# Patient Record
Sex: Male | Born: 1940
Health system: Southern US, Community
[De-identification: ages and names within clinical notes are randomized; demographics above are authoritative.]

## PROBLEM LIST (undated history)

## (undated) DIAGNOSIS — R030 Elevated blood-pressure reading, without diagnosis of hypertension: Secondary | ICD-10-CM

## (undated) DIAGNOSIS — Z87898 Personal history of other specified conditions: Secondary | ICD-10-CM

## (undated) DIAGNOSIS — M961 Postlaminectomy syndrome, not elsewhere classified: Secondary | ICD-10-CM

## (undated) DIAGNOSIS — M5431 Sciatica, right side: Secondary | ICD-10-CM

## (undated) DIAGNOSIS — Z87442 Personal history of urinary calculi: Secondary | ICD-10-CM

## (undated) DIAGNOSIS — G8929 Other chronic pain: Secondary | ICD-10-CM

## (undated) DIAGNOSIS — M549 Dorsalgia, unspecified: Secondary | ICD-10-CM

## (undated) HISTORY — DX: Sciatica, right side: M54.31

## (undated) HISTORY — DX: Personal history of other specified conditions: Z87.898

## (undated) HISTORY — DX: Postlaminectomy syndrome, not elsewhere classified: M96.1

## (undated) HISTORY — DX: Personal history of urinary calculi: Z87.442

## (undated) HISTORY — PX: OTHER SURGICAL HISTORY: SHX169

## (undated) HISTORY — DX: Elevated blood-pressure reading, without diagnosis of hypertension: R03.0

## (undated) HISTORY — PX: APPENDECTOMY: SHX54

---

## 2003-09-28 ENCOUNTER — Emergency Department (HOSPITAL_COMMUNITY): Admission: EM | Admit: 2003-09-28 | Discharge: 2003-09-28 | Payer: Self-pay | Admitting: Emergency Medicine

## 2004-01-23 ENCOUNTER — Emergency Department (HOSPITAL_COMMUNITY): Admission: EM | Admit: 2004-01-23 | Discharge: 2004-01-23 | Payer: Self-pay | Admitting: Emergency Medicine

## 2005-09-02 ENCOUNTER — Encounter: Admission: RE | Admit: 2005-09-02 | Discharge: 2005-09-02 | Payer: Self-pay | Admitting: Family Medicine

## 2005-09-22 ENCOUNTER — Encounter: Admission: RE | Admit: 2005-09-22 | Discharge: 2005-09-22 | Payer: Self-pay | Admitting: Family Medicine

## 2005-10-13 ENCOUNTER — Encounter: Payer: Self-pay | Admitting: Internal Medicine

## 2006-07-03 ENCOUNTER — Encounter: Payer: Self-pay | Admitting: Internal Medicine

## 2006-08-16 ENCOUNTER — Ambulatory Visit (HOSPITAL_BASED_OUTPATIENT_CLINIC_OR_DEPARTMENT_OTHER): Admission: RE | Admit: 2006-08-16 | Discharge: 2006-08-16 | Payer: Self-pay | Admitting: Orthopedic Surgery

## 2007-03-06 ENCOUNTER — Encounter: Admission: RE | Admit: 2007-03-06 | Discharge: 2007-03-16 | Payer: Self-pay | Admitting: Family Medicine

## 2007-09-21 ENCOUNTER — Encounter: Payer: Self-pay | Admitting: Internal Medicine

## 2008-03-27 ENCOUNTER — Encounter: Payer: Self-pay | Admitting: Internal Medicine

## 2008-05-17 ENCOUNTER — Emergency Department (HOSPITAL_COMMUNITY): Admission: EM | Admit: 2008-05-17 | Discharge: 2008-05-17 | Payer: Self-pay | Admitting: Emergency Medicine

## 2009-11-12 ENCOUNTER — Encounter: Payer: Self-pay | Admitting: Internal Medicine

## 2009-11-13 ENCOUNTER — Encounter: Payer: Self-pay | Admitting: Internal Medicine

## 2009-11-23 ENCOUNTER — Encounter: Payer: Self-pay | Admitting: Internal Medicine

## 2010-02-09 ENCOUNTER — Ambulatory Visit: Payer: Self-pay | Admitting: Internal Medicine

## 2010-02-09 DIAGNOSIS — R51 Headache: Secondary | ICD-10-CM | POA: Insufficient documentation

## 2010-02-09 DIAGNOSIS — R519 Headache, unspecified: Secondary | ICD-10-CM | POA: Insufficient documentation

## 2010-02-09 DIAGNOSIS — J189 Pneumonia, unspecified organism: Secondary | ICD-10-CM | POA: Insufficient documentation

## 2010-02-09 LAB — CONVERTED CEMR LAB
Cholesterol, target level: 200 mg/dL
HDL goal, serum: 40 mg/dL
LDL Goal: 160 mg/dL

## 2010-02-11 ENCOUNTER — Ambulatory Visit: Payer: Self-pay | Admitting: Internal Medicine

## 2010-02-17 ENCOUNTER — Ambulatory Visit: Payer: Self-pay | Admitting: Internal Medicine

## 2010-02-17 LAB — CONVERTED CEMR LAB
BUN: 17 mg/dL (ref 6–23)
Creatinine, Ser: 0.9 mg/dL (ref 0.4–1.5)

## 2010-02-20 ENCOUNTER — Ambulatory Visit: Payer: Self-pay | Admitting: Diagnostic Radiology

## 2010-02-20 ENCOUNTER — Ambulatory Visit (HOSPITAL_BASED_OUTPATIENT_CLINIC_OR_DEPARTMENT_OTHER): Admission: RE | Admit: 2010-02-20 | Discharge: 2010-02-20 | Payer: Self-pay | Admitting: Internal Medicine

## 2010-02-23 ENCOUNTER — Telehealth: Payer: Self-pay | Admitting: Internal Medicine

## 2010-02-25 ENCOUNTER — Telehealth: Payer: Self-pay | Admitting: Internal Medicine

## 2010-03-02 ENCOUNTER — Ambulatory Visit: Payer: Self-pay | Admitting: Internal Medicine

## 2010-03-05 ENCOUNTER — Telehealth: Payer: Self-pay | Admitting: Internal Medicine

## 2010-03-09 ENCOUNTER — Encounter: Payer: Self-pay | Admitting: Internal Medicine

## 2010-03-25 ENCOUNTER — Telehealth: Payer: Self-pay | Admitting: Internal Medicine

## 2010-04-14 ENCOUNTER — Telehealth: Payer: Self-pay | Admitting: Internal Medicine

## 2010-05-07 ENCOUNTER — Telehealth: Payer: Self-pay | Admitting: Internal Medicine

## 2010-05-18 ENCOUNTER — Encounter: Payer: Self-pay | Admitting: Internal Medicine

## 2010-05-24 ENCOUNTER — Ambulatory Visit: Payer: Self-pay | Admitting: Internal Medicine

## 2010-05-27 LAB — CONVERTED CEMR LAB: PSA: 2.78 ng/mL (ref 0.10–4.00)

## 2010-06-15 ENCOUNTER — Telehealth: Payer: Self-pay | Admitting: Internal Medicine

## 2010-06-20 HISTORY — PX: LUMBAR DISC SURGERY: SHX700

## 2010-07-20 NOTE — Assessment & Plan Note (Signed)
Summary: 3 MONTH OV//PH   Vital Signs:  Patient profile:   70 year old male Weight:      221.25 pounds Pulse rate:   97 / minute Pulse rhythm:   regular BP sitting:   132 / 74  (left arm) Cuff size:   regular  Vitals Entered By: Army Fossa CMA (May 24, 2010 8:40 AM) CC: 3 month f/u- not fasting Comments [facility] [address]   History of Present Illness: ROV As far as the headaches, his MRI was negative, he saw neurology, they discontinue all the pain medicine and put him on Topamax. He is doing great. As far as the pneumonia, his chest x-ray was negative, he is now asymptomatic   Current Medications (verified): 1)  Topamax 50 Mg Tabs (Topiramate) .Marland Kitchen.. 1 By Mouth Two Times A Day.  Allergies (verified): 1)  ! Pcn  Past History:  Past Medical History: long h/o HA-- s/p extensive w/u in the past. Has seen Dr Richardean Chimera and neuro @ [facility]  h/o kidney stones   Past Surgical History: Reviewed history from 02/09/2010 and no changes required. Appendectomy R arm surgery   Social History: Reviewed history from 02/09/2010 and no changes required. Married children 2 and 2 step daughters Never Smoked Alcohol use-no Drug use-no Regular exercise-yes occupation-- retired   Review of Systems Resp:  Denies chest pain with inspiration and sputum productive. GU:  Denies dysuria, hematuria, urinary frequency, and urinary hesitancy.  Physical Exam  General:  alert and well-developed.   Lungs:  normal respiratory effort, no intercostal retractions, no accessory muscle use, and normal breath sounds.   Heart:  normal rate, regular rhythm, no murmur, and no gallop.   Rectal:  No external abnormalities noted. Normal sphincter tone. No rectal masses or tenderness. no stools found Prostate:  Prostate gland firm and smooth, no enlargement, nodularity, tenderness, mass, asymmetry or induration.   Impression & Recommendations:  Problem # 1:  HEADACHE (ICD-784.0) recent MRI  negative He saw neurology, they discontinue all the pain medicine and prescribed Topamax. He is doing great.  The following medications were removed from the medication list:    Meloxicam 15 Mg Tabs (Meloxicam) .Marland Kitchen... 1 by mouth once daily as needed    Lortab 7.5-500 Mg Tabs (Hydrocodone-acetaminophen) .Marland Kitchen... 1 by mouth q6hrs as needed  Problem # 2:  PNEUMONIA (ICD-486) asymptomatic last chest x-ray negative  Problem # 3:  ROUTINE GENERAL MEDICAL EXAM@HEALTH  CARE FACL (ICD-V70.0) we'll do a formal physical exam when he comes back but we reviewed the chart Td--last? pneumonia shot 02/2010  Had a flu shot  reports he never had a colonoscopy Last PSA about 2 years ago per patient, DRE today wnl, we'll check a PSA today  Complete Medication List: 1)  Topamax 50 Mg Tabs (Topiramate) .Marland Kitchen.. 1 by mouth two times a day.  Other Orders: Venipuncture (57846) TLB-PSA (Prostate Specific Antigen) (84153-PSA) Specimen Handling (96295)  Patient Instructions: 1)  Please schedule a follow-up appointment in 4 to 6  months , fasting , physical exam    Orders Added: 1)  Venipuncture [28413] 2)  TLB-PSA (Prostate Specific Antigen) [24401-UUV] 3)  Specimen Handling [99000] 4)  Est. Patient Level III [25366]

## 2010-07-20 NOTE — Consult Note (Signed)
Summary: neurology, diagnosed with migraines  [facility]   Imported By: Lanelle Bal 03/22/2010 13:39:35  _____________________________________________________________________  External Attachment:    Type:   Image     Comment:   External Document

## 2010-07-20 NOTE — Letter (Signed)
Summary: [facility]  [facility]   Imported By: Lanelle Bal 04/21/2010 11:33:28  _____________________________________________________________________  External Attachment:    Type:   Image     Comment:   External Document

## 2010-07-20 NOTE — Letter (Signed)
Summary: [facility]  [facility]   Imported By: Lanelle Bal 02/05/2010 12:45:35  _____________________________________________________________________  External Attachment:    Type:   Image     Comment:   External Document

## 2010-07-20 NOTE — Assessment & Plan Note (Signed)
Summary: new to est/kn   Vital Signs:  Patient profile:   70 year old male Height:      70 inches Weight:      219.38 pounds BMI:     31.59 Pulse rate:   93 / minute Pulse rhythm:   regular BP sitting:   120 / 76  (left arm) Cuff size:   regular  Vitals Entered By: Army Fossa CMA (February 09, 2010 12:43 PM) CC: New to establish , discuss HA's Comments States he has frontal lobe HA's getting worse Discuss Td and Pneumovax.   History of Present Illness: Transferring from [place] Long history of headaches, for the last 6 months the headache has been more frequent and more intense. Features of the headaches are unchanged from previous ones  ROS Denies fever No sinus congestion or discharge Denies any recent head injury, diplopia or seizure activity no problems with increased anxiety   CHART REVIEW:  He was diagnosed with bronchopneumonia earlier this month On 01-21-10 he had a normal CBC, BMP and LFTs (except for a bilirubin of 1.7) He is now asymptomatic from the pneumonia, denies any fever or cough.  MRI of the brain with and without contrast from 2007 IMPRESSION:   1.   No evidence of acute ischemia.   2.   No signal abnormalities within the brain parenchyma.   3.   Mild to moderate sinusitis changes in the ethmoids and the   frontal sinuses.   4.   3.3 mm soft tissue entity projecting posteriorly from the middle   aspect of the posterior border of the clivus.  This may represent   volume averaging through the normal clivus although another   pathologic process such as a chordoma or a CSF pulsation artifact   cannot be totally excluded.   5.   Patchy hyperintensity in the left mastoid air cell region may   represent inflammatory thickening.  Clinical correlation is   suggested.   6.   Incidental note of a prominent cisterna magna, a developmental   variation.       Preventive Screening-Counseling & Management  Alcohol-Tobacco     Smoking Status:  never  Caffeine-Diet-Exercise     Does Patient Exercise: yes      Drug Use:  no.    Current Medications (verified): 1)  Meloxicam 15 Mg Tabs (Meloxicam) .Marland Kitchen.. 1 By Mouth Once Daily As Needed 2)  Lortab 7.5-500 Mg Tabs (Hydrocodone-Acetaminophen) .Marland Kitchen.. 1 By Mouth Q8hrs As Needed 3)  Tramadol Hcl 50 Mg Tabs (Tramadol Hcl) .Marland Kitchen.. 1-2 Tabs Once Daily As Needed  Allergies (verified): 1)  ! Pcn  Past History:  Social History: Last updated: 02/09/2010 Married children 2 and 2 step daughters Never Smoked Alcohol use-no Drug use-no Regular exercise-yes occupation-- retired   Risk Factors: Exercise: yes (02/09/2010)  Risk Factors: Smoking Status: never (02/09/2010)  Past Medical History: long h/o HA-- s/p extensive w/u in the past. Has seen Dr Richardean Chimera and neuro @ [facility]   Past Surgical History: Appendectomy R arm surgery   Family History: Raised in a foster home, knows little about his family colon ca--no prostate ca--no MI--no DM--no  Social History: Married children 2 and 2 step daughters Never Smoked Alcohol use-no Drug use-no Regular exercise-yes occupation-- retired  Smoking Status:  never Drug Use:  no Does Patient Exercise:  yes  Physical Exam  General:  alert, well-developed, and well-nourished.   Lungs:  normal respiratory effort, no intercostal retractions, no accessory muscle use, and normal  breath sounds.   Heart:  normal rate, regular rhythm, no murmur, and no gallop.   Extremities:  no lower extremity edema Neurologic:  alert & oriented X3, cranial nerves II-XII intact, strength normal in all extremities, gait normal, and DTRs symmetrical and normal.   Psych:  Cognition and judgment appear intact. Alert and cooperative with normal attention span and concentration. not anxious appearing and not depressed appearing.     Impression & Recommendations:  Problem # 1:  HEADACHE (ICD-784.0)  worsening headache for the last 6 months  the patient saw   neurology for the last time about 2 years ago, last MRI available for review is from 2007 (see HPI) Plan: Re-refer  to neurology, likes to go to [facility]  [facility] His updated medication list for this problem includes:    Meloxicam 15 Mg Tabs (Meloxicam) .Marland Kitchen... 1 by mouth once daily as needed    Lortab 7.5-500 Mg Tabs (Hydrocodone-acetaminophen) .Marland Kitchen... 1 by mouth q8hrs as needed    Tramadol Hcl 50 Mg Tabs (Tramadol hcl) .Marland Kitchen... 1-2 tabs once daily as needed  Orders: Radiology Referral (Radiology) Neurology Referral (Neuro)  Problem # 2:  PNEUMONIA (ICD-486)  diagnosed with pneumonia earlier this month, now asymptomatic. Chest x-ray  Orders: T-2 View CXR (71020TC)  Complete Medication List: 1)  Meloxicam 15 Mg Tabs (Meloxicam) .Marland Kitchen.. 1 by mouth once daily as needed 2)  Lortab 7.5-500 Mg Tabs (Hydrocodone-acetaminophen) .Marland Kitchen.. 1 by mouth q8hrs as needed 3)  Tramadol Hcl 50 Mg Tabs (Tramadol hcl) .Marland Kitchen.. 1-2 tabs once daily as needed  Patient Instructions: 1)  Please schedule a follow-up appointment in 3 months .    Risk Factors:  Tobacco use:  never Drug use:  no Alcohol use:  no Exercise:  yes  Appended Document: new to est/kn Notes from neurology 2007 reviewed (Dr Richardean Chimera) she saw the patient with a question of decreased memory (h/o of severe  concussion in the past, related) and headaches, EEG was negative. Pt was prescribed Topamax for headaches

## 2010-07-20 NOTE — Letter (Signed)
Summary: [facility]  [facility]   Imported By: Lanelle Bal 02/05/2010 12:51:07  _____________________________________________________________________  External Attachment:    Type:   Image     Comment:   External Document

## 2010-07-20 NOTE — Progress Notes (Signed)
Summary: refill  Phone Note Refill Request Call back at Home Phone [phone] Message from:  Patient on March 25, 2010 9:37 AM  Refills Requested: Medication #1:  TOPAMAX 25 MG TABS 1 by mouth daily for 1 week   Last Refilled: 02/26/2010 Last seen 02-09-10. Please advise of refills.  Initial call taken by: Lucious Groves CMA,  March 25, 2010 9:37 AM  Follow-up for Phone Call        okay to refill, he should be now on 2 tablets a day (at least) find out for sure how many  is he taking Sibley Rolison E. Toney Lizaola MD  March 25, 2010 10:26 AM   Additional Follow-up for Phone Call Additional follow up Details #1::        Per patient he is taking 1po qAM and 2po qPM per the neurologist instructions. He will see neuro again in nov. Additional Follow-up by: Lucious Groves CMA,  March 25, 2010 10:41 AM    Additional Follow-up for Phone Call Additional follow up Details #2::    great Bralon Antkowiak E. Zekiah Caruth MD  March 25, 2010 12:35 PM   Prescriptions: TOPAMAX 25 MG TABS (TOPIRAMATE) 1 by mouth daily for 1 week, then increase to 2 tabs daily.  #90 x 0   Entered by:   Lucious Groves CMA   Authorized by:   Nolon Rod. Soren Pigman MD   Signed by:   Lucious Groves CMA on 03/25/2010   Method used:   Electronically to        [facility] [phone][facility] (retail)       [address]       [place], [place]  [address]       Ph: [phone]       Fax: [phone]   RxID:   [id number]

## 2010-07-20 NOTE — Progress Notes (Signed)
Summary: new prescription, old records reviewed  Phone Note Refill Request Call back at [phone] Message from:  Patient on April 14, 2010 9:19 AM  Refills Requested: Medication #1:  TOPAMAX 25 MG TABS 1 by mouth daily for 1 week   Supply Requested: 1 month   Notes: 2 IN THE MORNING AND 2 AT NIGHT  PT CAME IN AND HE NEEDS A NEW SCIRIPT. PT IS TAKING 2 IN THE MORNING AND 2 AT NIGHT[facility] [address]  Initial call taken by: Freddy Jaksch,  April 14, 2010 9:20 AM  Follow-up for Phone Call        Okay to increase to 2 in the am and 2 in the pm?  Follow-up by: Army Fossa CMA,  April 14, 2010 9:24 AM  Additional Follow-up for Phone Call Additional follow up Details #1::        --okay to increase Topamax to 50 mg 1 po b.i.d. (Okay to change to Topamax 50 mg tablets) --also, the patient that I did review several pages of old records, he needs to pick them out and keep them in a safe place for future reference. I am scanning the most important old  records [address] E. Paz MD  April 14, 2010 12:03 PM     Additional Follow-up for Phone Call Additional follow up Details #2::    Pt is aware. Army Fossa CMA  April 14, 2010 1:10 PM   New/Updated Medications: TOPAMAX 50 MG TABS (TOPIRAMATE) 1 by mouth two times a day. Prescriptions: TOPAMAX 50 MG TABS (TOPIRAMATE) 1 by mouth two times a day.  #60 x 1   Entered by:   Army Fossa CMA   Authorized by:   Nolon Rod. Paz MD   Signed by:   Army Fossa CMA on 04/14/2010   Method used:   Electronically to        [facility] [phone]* (retail)       [address]       [place], [place]  [address]       Ph: [phone]       Fax: [phone]   RxID:   [id number]

## 2010-07-20 NOTE — Progress Notes (Signed)
Summary: MRI  Phone Note Call from Patient   Caller: Patient Summary of Call: PT WALKED IN AND IS AWARE OF MRI RESULTS Initial call taken by: Lavell Islam,  February 23, 2010 12:57 PM

## 2010-07-20 NOTE — Letter (Signed)
Summary: [facility]  [facility]   Imported By: Lanelle Bal 04/21/2010 11:35:48  _____________________________________________________________________  External Attachment:    Type:   Image     Comment:   External Document

## 2010-07-20 NOTE — Letter (Signed)
Summary: [facility]  [facility]   Imported By: Lanelle Bal 02/05/2010 12:52:12  _____________________________________________________________________  External Attachment:    Type:   Image     Comment:   External Document

## 2010-07-20 NOTE — Letter (Signed)
Summary: [facility]  [facility]   Imported By: Lanelle Bal 04/21/2010 11:34:38  _____________________________________________________________________  External Attachment:    Type:   Image     Comment:   External Document

## 2010-07-20 NOTE — Progress Notes (Signed)
Summary: Refill Request  Phone Note Refill Request Message from:  Patient  Refills Requested: Medication #1:  LORTAB 7.5-500 MG TABS 1 by mouth q6hrs as needed   Dosage confirmed as above?Dosage Confirmed   Brand Name Necessary? No   [facility] Requested[facility] [address]  Next Appointment Scheduled: 12.5.11 Initial call taken by: Harold Barban,  March 05, 2010 9:19 AM  Follow-up for Phone Call        ok #40, no refills Follow-up by: [facility] E. Paz MD,  March 05, 2010 12:47 PM    Prescriptions: LORTAB 7.5-500 MG TABS (HYDROCODONE-ACETAMINOPHEN) 1 by mouth q6hrs as needed  #40 x 0   Entered by:   Army Fossa CMA   Authorized by:   Nolon Rod. Paz MD   Signed by:   Army Fossa CMA on 03/05/2010   Method used:   Printed then faxed to ...       [facility] [phone]* (retail)       [address]       [place], [place]  [address]       Ph: [phone]       Fax: [phone]   RxID:   [id number]

## 2010-07-20 NOTE — Assessment & Plan Note (Signed)
Summary: FLU SHOT AND PNEUMONIA SHOT///[facility]  Nurse Visit   Allergies: 1)  ! Pcn  Immunizations Administered:  Pneumonia Vaccine:    Vaccine Type: Pneumovax    Site: right deltoid    Mfr: Merck    Dose: 0.5 ml    Route: IM    Given by: Army Fossa CMA    Exp. Date: 09/02/2011    Lot #: [id number]  Orders Added: 1)  Admin 1st Vaccine [90471] 2)  Flu Vaccine 58yrs + [21308] 3)  Pneumococcal Vaccine [65784] 4)  Admin of Any Addtl Vaccine [69629] Flu Vaccine Consent Questions     Do you have a history of severe allergic reactions to this vaccine? no    Any prior history of allergic reactions to egg and/or gelatin? no    Do you have a sensitivity to the preservative Thimersol? no    Do you have a past history of Guillan-Barre Syndrome? no    Do you currently have an acute febrile illness? no    Have you ever had a severe reaction to latex? no    Vaccine information given and explained to patient? yes    Are you currently pregnant? no    Lot Number:[id number]   Exp Date:12/18/2010   Site Given  Left Deltoid IM

## 2010-07-20 NOTE — Letter (Signed)
Summary: [facility]  [facility]   Imported By: Lanelle Bal 03/03/2010 13:05:43  _____________________________________________________________________  External Attachment:    Type:   Image     Comment:   External Document

## 2010-07-20 NOTE — Progress Notes (Signed)
Summary: Still having h/a's  Phone Note Call from Patient Call back at Home Phone [phone]   Caller: Patient Summary of Call: Patient says that his headaches are still bad. He is taking the tramdol but after while the medicine wears off and then it hurts in the front again. When they get really bad he takes the hydrocodone and that is helping some. This seems to happening on a daily basis. Please advise.   MRI results came back normal.  Initial call taken by: Harold Barban,  February 25, 2010 2:31 PM  Follow-up for Phone Call        discontinue Ultram increase Lorcet to every 6 hours In the past he took Topamax, recommend retry it unless he is intolerant. start 25 mg daily for one week, then increase to 50 mg daily. He needs to see neurology. Referral pending Jose E. Paz MD  February 26, 2010 10:38 AM   Additional Follow-up for Phone Call Additional follow up Details #1::        Pt is aware, rx sent to pharmacy. Army Fossa CMA  February 26, 2010 11:09 AM     New/Updated Medications: LORTAB 7.5-500 MG TABS (HYDROCODONE-ACETAMINOPHEN) 1 by mouth q6hrs as needed TOPAMAX 25 MG TABS (TOPIRAMATE) 1 by mouth daily for 1 week, then increase to 2 tabs daily. APrescriptions: TOPAMAX 25 MG TABS (TOPIRAMATE) 1 by mouth daily for 1 week, then increase to 2 tabs daily.  #60 x 0   Entered by:   Army Fossa CMA   Authorized by:   Nolon Rod. Paz MD   Signed by:   Army Fossa CMA on 02/26/2010   Method used:   Electronically to        [facility] [phone]* (retail)       [address]       [place], [place]  [address]       Ph: [phone]       Fax: [phone]   RxID:   [id number]

## 2010-07-20 NOTE — Progress Notes (Signed)
Summary: REFILL  Phone Note Refill Request Call back at Home Phone [phone] Message from:  Patient on May 07, 2010 8:07 AM  Refills Requested: Medication #1:  TOPAMAX 50 MG TABS 1 by mouth two times a day..   Dosage confirmed as above?Dosage Confirmed   Supply Requested: 1 month   Last Refilled: 04/14/[facility] [address] PT STATES THAT THERE IS A REFILL LEFT ON THE RX BUT [facility] WILL NOT FILL [facility] BECAUSE HE IS TAKING 2 PILLS A DAY AND THE DIRCETIONS ONLY SAY ONE TABLET SO IT IS " TOO EARLY". PT NEEDS THIS ASAP BECAUSE HE IS GOING TO [place] ALL OF NEXT WEEK AND DOESN'T WANT TO RUN OUT OF MEDICINE.  Next Appointment Scheduled: 05/24/10 Initial call taken by: Lavell Islam,  May 07, 2010 8:09 AM  Follow-up for Phone Call        This was corrected in previous note, faxed to pharmacy. Follow-up by: Lucious Groves CMA,  May 07, 2010 9:34 AM

## 2010-07-22 NOTE — Progress Notes (Signed)
Summary: REFILL REQUEST  Phone Note Refill Request Message from:  Patient on June 15, 2010 8:12 AM  Refills Requested: Medication #1:  TOPAMAX 50 MG TABS 1 by mouth two times a day..   Dosage confirmed as above?Dosage Confirmed   Supply Requested: 3 months [facility] [address]  Next Appointment Scheduled: 11/23/10 Initial call taken by: Lavell Islam,  June 15, 2010 8:13 AM  Follow-up for Phone Call        Okay to give pt a 3 month supply on Topamax? Army Fossa CMA  June 15, 2010 8:17 AM   Additional Follow-up for Phone Call Additional follow up Details #1::        yes, 3 months and 1 RF Jose E. Paz MD  June 15, 2010 9:56 AM     Prescriptions: TOPAMAX 50 MG TABS (TOPIRAMATE) 1 by mouth two times a day.  #180 x 1   Entered by:   Army Fossa CMA   Authorized by:   Nolon Rod. Paz MD   Signed by:   Army Fossa CMA on 06/15/2010   Method used:   Electronically to        [facility] [phone]* (retail)       [address]       [place], [place]  [address]       Ph: [phone]       Fax: [phone]   RxID:   [id number]

## 2010-07-23 NOTE — Letter (Signed)
Summary: [facility]  [facility]   Imported By: Lanelle Bal 05/27/2010 14:30:27  _____________________________________________________________________  External Attachment:    Type:   Image     Comment:   External Document

## 2010-08-15 ENCOUNTER — Emergency Department (INDEPENDENT_AMBULATORY_CARE_PROVIDER_SITE_OTHER): Payer: Medicare HMO

## 2010-08-15 ENCOUNTER — Emergency Department (HOSPITAL_BASED_OUTPATIENT_CLINIC_OR_DEPARTMENT_OTHER)
Admission: EM | Admit: 2010-08-15 | Discharge: 2010-08-15 | Disposition: A | Discharge status: Home / Self Care | Payer: Medicare HMO | Attending: Emergency Medicine | Admitting: Emergency Medicine

## 2010-08-15 DIAGNOSIS — M545 Low back pain, unspecified: Secondary | ICD-10-CM

## 2010-08-15 DIAGNOSIS — M79609 Pain in unspecified limb: Secondary | ICD-10-CM | POA: Insufficient documentation

## 2010-08-15 DIAGNOSIS — M5430 Sciatica, unspecified side: Secondary | ICD-10-CM | POA: Insufficient documentation

## 2010-08-15 DIAGNOSIS — IMO0001 Reserved for inherently not codable concepts without codable children: Secondary | ICD-10-CM | POA: Insufficient documentation

## 2010-08-17 ENCOUNTER — Ambulatory Visit (INDEPENDENT_AMBULATORY_CARE_PROVIDER_SITE_OTHER): Payer: Medicare HMO | Admitting: Internal Medicine

## 2010-08-17 ENCOUNTER — Encounter: Payer: Self-pay | Admitting: Internal Medicine

## 2010-08-17 DIAGNOSIS — G8929 Other chronic pain: Secondary | ICD-10-CM | POA: Insufficient documentation

## 2010-08-17 DIAGNOSIS — IMO0002 Reserved for concepts with insufficient information to code with codable children: Secondary | ICD-10-CM | POA: Insufficient documentation

## 2010-08-18 ENCOUNTER — Telehealth: Payer: Self-pay | Admitting: Internal Medicine

## 2010-08-19 ENCOUNTER — Telehealth: Payer: Self-pay | Admitting: Internal Medicine

## 2010-08-20 ENCOUNTER — Encounter: Payer: Self-pay | Admitting: Internal Medicine

## 2010-08-25 ENCOUNTER — Encounter: Payer: Self-pay | Admitting: Internal Medicine

## 2010-08-26 NOTE — Progress Notes (Signed)
Summary: MRI results  Phone Note Call from Patient Call back at Home Phone [phone] [facility]     Caller: Patient Summary of Call: Spoke w/ patient would like MRI results informed patient that we have recieved report and is waiting for Dr. Drue Novel to review will try to give patient a call tomorrow. Also stated that he may need some more pain medicatin before weekend until he figures out what the next step was. Initial call taken by: Doristine Devoid CMA,  August 19, 2010 4:10 PM  Follow-up for Phone Call         MRI  showed  osteoarthritis in the back  which I believe accounts for his pain.  these results only emphasized the need to see the orthopedic surgeon. Okay to call Tylox #60 no refills Follow-up by: Jose E. Paz MD,  August 20, 2010 9:54 AM  Additional Follow-up for Phone Call Additional follow up Details #1::        I spoke w/ pt he is aware. Army Fossa CMA  August 20, 2010 10:00 AM     Prescriptions: TYLOX 5-500 MG CAPS (OXYCODONE-ACETAMINOPHEN) 1 or 2 by mouth every 6 hours as needed pain  #60 x 0   Entered by:   Army Fossa CMA   Authorized by:   Nolon Rod. Paz MD   Signed by:   Army Fossa CMA on 08/20/2010   Method used:   Print then Give to Patient   RxID:   [id number]

## 2010-08-26 NOTE — Assessment & Plan Note (Signed)
Summary: ER follow up/seen at med center/kb   Vital Signs:  Patient profile:   70 year old male Weight:      210 pounds Pulse rate:   89 / minute Pulse rhythm:   regular BP sitting:   130 / 86  (left arm) Cuff size:   regular  Vitals Entered By: Army Fossa CMA (August 17, 2010 11:34 AM) CC: ER f/u Comments Sciatica- meds not helping was given Ibuprofen 600 mg and Hydrocod-Apap 5-500 MRI? [facility] [address]    History of Present Illness:  developed a cramp-like pain 2 weeks ago at the posterior aspect of his right knee. Since then, the pain is getting worse and now pain  is starting from the right buttock and radiates down to the toes. He went to the emergency room 2 dyas ago,  chart is reviewed : simple x-ray of the back  show no acute findings and DJD. After some parenteral medication, he was discharged home on hydrocodone and 2 tablets of prednisone 40 mg  Since then the pain is not any better actually if anything is worse. The pain is triggered by moving.  ROS Denies any fevers No rash in the buttocks or legs No dysuria or gross hematuria No bladder or bowel  incontinence  mild constipation since he started to take the pain medicine  Current Medications (verified): 1)  Topamax 50 Mg Tabs (Topiramate) .Marland Kitchen.. 1 By Mouth Two Times A Day.  Allergies (verified): 1)  ! Pcn  Past History:  Past Medical History: Reviewed history from 05/24/2010 and no changes required. long h/o HA-- s/p extensive w/u in the past. Has seen Dr Richardean Chimera and neuro @ [facility]  h/o kidney stones   Past Surgical History: Reviewed history from 02/09/2010 and no changes required. Appendectomy R arm surgery   Social History: Reviewed history from 02/09/2010 and no changes required. Married children 2 and 2 step daughters Never Smoked Alcohol use-no Drug use-no Regular exercise-yes occupation-- retired   Physical Exam  General:  alert.   severe distress mostly when he changes  position Abdomen:   abdomen is soft Extremities:   no lower extremity edema Neurologic:   antalgic gait  No motor deficit absent DTR from right ankle   Impression & Recommendations:  Problem # 1:  LUMBAR RADICULOPATHY (ICD-724.4)  acute pain that radiates from the right buttock down to the toes. Plan: MRI ortho  referral  prednisone Switch from hydrocodone to Tylox  warned about constipation.  His updated medication list for this problem includes:    Tylox 5-500 Mg Caps (Oxycodone-acetaminophen) .Marland Kitchen... 1 or 2 by mouth every 6 hours as needed pain  Orders: Radiology Referral (Radiology) Orthopedic Referral (Ortho)  Complete Medication List: 1)  Topamax 50 Mg Tabs (Topiramate) .Marland Kitchen.. 1 by mouth two times a day. 2)  Tylox 5-500 Mg Caps (Oxycodone-acetaminophen) .Marland Kitchen.. 1 or 2 by mouth every 6 hours as needed pain 3)  Prednisone 10 Mg Tabs (Prednisone) .... 4 by mouth once daily x 2, 3 by mouth once daily x2, 2 po qd x2, 1 by mouth once daily x 2  Patient Instructions: 1)   rest, warm compress 2)  Take medications as prescribed 3)  Call if fever or rash 4)   watch for constipation, you may like to use Colace daily and milk of magnesia. Call if constipation severe Prescriptions: TYLOX 5-500 MG CAPS (OXYCODONE-ACETAMINOPHEN) 1 or 2 by mouth every 6 hours as needed pain  #40 x 0   Entered and  Authorized by:   Nolon Rod Radiance Deady MD   Signed by:   Nolon Rod. Shailah Gibbins MD on 08/17/2010   Method used:   Print then Give to Patient   RxID:   [id number] PREDNISONE 10 MG TABS (PREDNISONE) 4 by mouth once daily x 2, 3 by mouth once daily x2, 2 po qd x2, 1 by mouth once daily x 2  #20 x 0   Entered and Authorized by:   Nolon Rod. Braylyn Eye MD   Signed by:   Nolon Rod. Maleigha Colvard MD on 08/17/2010   Method used:   Print then Give to Patient   RxID:   [id number]    Orders Added: 1)  Radiology Referral [Radiology] 2)  Orthopedic Referral [Ortho] 3)  Est. Patient Level IV [25366]

## 2010-08-26 NOTE — Progress Notes (Signed)
Summary: CALL  REQUEST PEER TO PEER -MRI LS DENIED AT CLINICAL LEVEL  Phone Note Outgoing Call Call back at [phone] [facility]   Call placed by: Magdalen Spatz [facility],  August 18, 2010 9:00 AM Call placed to: Insurer Summary of Call: MRI LS W/O CONTRAST HAS NOT BEEN APPROVED AT [employer]'SCLINICAL LEVEL, DID NOT MEET THEIR CRITERIA, AND PATIENT LACKS MINIMUM OF 4 WEEKS PHYSICAL THERAPY/TREATMENT.  CASE # [id number] HAS BEEN FORWARED TO [employer] PEER REVIEW.  PER CSR, THE PHYSICIAN WILL BE CALLING TO SPEAK WITH DR. Julliana Whitmyer ASAP IF DR. Ashur Glatfelter IS AVAILABLE.  Follow-up for Phone Call         the patient clearly as a great deal of pain and an abnormal neurological exam. he needs an MRI in my opinion. please contact me with his insurance Rodneshia Greenhouse E. Lillionna Nabi MD  August 18, 2010 9:36 AM   Additional Follow-up for Phone Call Additional follow up Details #1::        Per the clinical review department, she forwarded a "stat" request to the Radiologist "Peer" to call & speak with Dr. Drue Novel.  Our Back Line was given for them to call. Magdalen Spatz [facility]  August 18, 2010 10:20 AM     Additional Follow-up for Phone Call Additional follow up Details #2::    Dr Eda Paschal from [place] called to do peer-to-peer about MRI of lumbar spine with dr Drue Novel. Dr Roxan Hockey request that dr Drue Novel give him a call at his own convenience at [phone].Marland KitchenMarland KitchenMarland KitchenFelecia Deloach CMA  August 18, 2010 12:05 PM    spoke with a physician, he Dorette Grate the study , please arrange [place] E. Mertie Haslem MD  August 18, 2010 1:14 PM   Additional Follow-up for Phone Call Additional follow up Details #3:: Details for Additional Follow-up Action Taken: HAD TO CALL [employer] BACK FOR PRIOR AUTH WHICH IS #[id number].  PT APPT TODAY, 08-18-2010 @ 3PM [facility], THEY WILL CALL REPORT TO DR. Malley Hauter TODAY. Magdalen Spatz [facility]  August 18, 2010 1:44 PM

## 2010-09-07 NOTE — Letter (Signed)
Summary: [facility]  [facility]   Imported By: Maryln Gottron 09/01/2010 14:06:54  _____________________________________________________________________  External Attachment:    Type:   Image     Comment:   External Document

## 2010-11-05 NOTE — Op Note (Signed)
Bradley Navarro           ACCOUNT NO.:  [id number]   MEDICAL RECORD NO.:  [id number]          PATIENT TYPE:  AMB   LOCATION:  DSC                          FACILITY:  [facility]   PHYSICIAN:  Katy Fitch. Sypher, M.D. DATE OF BIRTH:  03-24-1941   DATE OF PROCEDURE:  08/16/2006  DATE OF DISCHARGE:                               OPERATIVE REPORT   PREOPERATIVE DIAGNOSES:  1. Adhesive capsulitis right shoulder.  2. Plain film and MRI evidence of significant AC arthropathy with      stage II impingement right shoulder.  3. Chronic stenosing tenosynovitis left long finger at A1 pulley with      incidental Dupuytren's contracture and mild carpal tunnel syndrome.   POSTOPERATIVE DIAGNOSES:  1. Adhesive capsulitis right shoulder.  2. Plain film and MRI evidence of significant AC arthropathy with      stage II impingement right shoulder.  3. Chronic stenosing tenosynovitis left long finger at A1 pulley with      incidental Dupuytren's contracture and mild carpal tunnel syndrome.   Confirmation of the significant adhesive capsulitis of the right  glenohumeral joint and AC arthropathy with adhesive bursitis of the  subacromial space right shoulder; also chronic stenosing tenosynovitis  left long finger at A1 pulley.   OPERATION:  1. Examination of right shoulder under anesthesia followed by      manipulation of adhesive capsulitis.  2. Arthroscopic capsulectomy, debridement of granulation tissue and      debridement of degenerative labrum right shoulder.  3. Arthroscopic subacromial debridement with lysis of adhesive      bursitis, subacromial decompression and relaxation of      coracoacromial ligament.  4. Open resection of distal clavicle.  5. Left long finger A1 pulley release.   OPERATING SURGEON:  Josephine Igo, M.D.   ASSISTANT:  Molly Maduro Dasnoit PA-C.   ANESTHESIA:  General endotracheal supplemented by right interscalene  block, supervising anesthesiologist Dr. Gypsy Balsam.   INDICATIONS:  Bradley Navarro is a 70 year old gentleman referred for  evaluation and management of a painful and stiff right shoulder and  bilateral hand numbness with triggering of the long fingers bilaterally.   Clinical examination revealed signs of significant chronic adhesive  capsulitis of the right shoulder present for more than 1 year.  He had  failed nonoperative measures including anti-inflammatory medication  injection and therapy.  With respect to his hands he had bilateral long  finger trigger fingers and bilateral hand numbness.  Clinical  examination confirmed stenosing tenosynovitis, long fingers bilaterally  and electrodiagnostic studies confirmed bilateral carpal tunnel  syndrome, left greater than right.   He respond initially to injection of left ulnar bursa but had persistent  triggering.   After informed consent he is now brought to the operating room to  address his right shoulder and his left long trigger finger.   Preoperatively he was interviewed by Dr. Gypsy Balsam of the anesthesia  service.  Dr. Gypsy Balsam recommended a right interscalene block for  perioperative analgesia.  This was placed without complication  in the  holding area.   PROCEDURE:  Bradley Navarro was brought to the operating room and  placed in supine position on the operating table.   Following the induction of general endotracheal anesthesia, he was  positioned with his left arm on IV table and placed in a torso and  holder for shoulder arthroscopy.   We initially addressed the left upper extremity pathology.  The left arm  was prepped with Betadine soap solution and sterilely draped.  A Esmarch  bandage was used to exsanguinate the hand and forearm and left on the  proximal forearm as a tourniquet.   Procedure commenced with an oblique incision paralleling the distal  palmar crease.  The subcutaneous tissues were carefully divided  revealing hypertrophic palmar fascia consistent with  mild Dupuytren's  disease.   The hypertrophic fascia was dissected followed by isolation of the A1  pulley.  Neurovascular structures were retracted with blunt retractors.  The A1 pulley was released with scalpel and scissors as well as a small  A0 pulley.  The tendons were delivered and found to be otherwise normal.   Release of the retinacular system relieved the triggering at this level.  The wound was then closed with mattress sutures of 5-0 nylon.  The hand  was then dressed with Xeroflo, sterile gauze and a Coban dressing.   Attention then directed the right shoulder.  [name] was  positioned in the beach-chair positioner with the torso and head holder  designed for shoulder arthroscopy.  The entire upper extremity and  forequarter were prepped with DuraPrep.  1 gram of vancomycin had been  administered in the holding area as IV prophylactic antibiotic due to  antibiotic allergy.   The shoulder was examined under anesthesia.  His combined elevation was  noted 140 degrees, external rotation 60 degrees and internal rotation of  20 degrees.  After gentle manipulation, we increased the combined  elevation to 175 degrees, external rotation to 90 degrees at 90 degrees  abduction and internal rotation to 70 degrees.   After routine DuraPrep of the right upper extremity and forequarter,  impervious arthroscopy drapes were applied.  The shoulder was then  distended with 20 mL of sterile saline brought in with an anterior  spinal needle followed by placement of scope through a standard  posterior viewing portal.  Diagnostic arthroscopy confirmed abundant  granulation tissue and adhesions of the anterior capsule subscapularis  and rotator cuff.  An anterior portal was created under direct vision  followed by a 4.5-mm suction shaver to debride the granulation tissues  and a bipolar cautery was used for hemostasis.  After all the pathologic tissue was removed we did tenolysis of  subscapularis tendon and anterior  capsular ligaments.  The biceps origin was noted be sound.  The  anterior, superior, posterior and inferior labrum was intact.  The  anterior glenohumeral ligaments were intact.  There was no sign of a  rupture of the origins of the glenohumeral ligaments.   The arthroscope was then removed from glenohumeral joint and placed in  the subacromial space.  Florid bursitis was noted.  This was debrided  with a suction shaver followed by hemostasis.  There was a prominent  medial and anterior acromial osteophyte adjacent to the [facility] joint and a  prominent distal clavicle inferior osteophyte.  The acromion was leveled  to a type 1 morphology with release of coracoacromial ligament and  hemostasis with bipolar cautery.  The distal clavicle was then isolated  and cautery used to release the capsule.  I then proceeded to resect the  distal clavicle through  a short 2-cm incision directly over the distal  clavicle.   The distal clavicle was exposed subperiosteally followed by placement of  Baby Bennett retractors and removal of the distal clavicle 18 mm with an  oscillating saw.  This wound was checked for bleeding points followed by  repair of the trapezius and deltoid muscles with mattress sutures of #2  FiberWire.   Thereafter the subacromial space was palpated digitally and found be  well decompressed.   The wound was repaired with subdermal sutures of 0-0 Vicryl and  intradermal 3-0 Prolene.  There no apparent complications.   Mr. Darley tolerated surgery and anesthesia well.  He was transferred  to recovery room with stable vital signs.   He will be discharged to care of his family with advice to return to our  office for follow-up in 24 hours to initiate an excise program.      Katy Fitch. Sypher, M.D.  Electronically Signed     RVS/MEDQ  D:  08/16/2006  T:  08/16/2006  Job:  [id number]   cc:   Chales Salmon. Abigail Miyamoto, M.D.

## 2010-11-23 ENCOUNTER — Encounter: Payer: Self-pay | Admitting: Internal Medicine

## 2011-01-26 ENCOUNTER — Ambulatory Visit (HOSPITAL_BASED_OUTPATIENT_CLINIC_OR_DEPARTMENT_OTHER)
Admission: RE | Admit: 2011-01-26 | Discharge: 2011-01-26 | Disposition: A | Discharge status: Home / Self Care | Payer: Medicare HMO | Source: Ambulatory Visit | Attending: Internal Medicine | Admitting: Internal Medicine

## 2011-01-26 ENCOUNTER — Ambulatory Visit (INDEPENDENT_AMBULATORY_CARE_PROVIDER_SITE_OTHER): Payer: Medicare HMO | Admitting: Internal Medicine

## 2011-01-26 ENCOUNTER — Encounter: Payer: Self-pay | Admitting: Internal Medicine

## 2011-01-26 DIAGNOSIS — R609 Edema, unspecified: Secondary | ICD-10-CM

## 2011-01-26 DIAGNOSIS — R000 Tachycardia, unspecified: Secondary | ICD-10-CM

## 2011-01-26 DIAGNOSIS — Z136 Encounter for screening for cardiovascular disorders: Secondary | ICD-10-CM

## 2011-01-26 LAB — BASIC METABOLIC PANEL
BUN: 16 mg/dL (ref 6–23)
CO2: 30 mEq/L (ref 19–32)
Calcium: 9.8 mg/dL (ref 8.4–10.5)
Chloride: 102 mEq/L (ref 96–112)
Creatinine, Ser: 1 mg/dL (ref 0.4–1.5)
GFR: 80.62 mL/min (ref >60.00)
Glucose, Bld: 94 mg/dL (ref 70–99)
Potassium: 4.4 mEq/L (ref 3.5–5.1)
Sodium: 142 mEq/L (ref 135–145)

## 2011-01-26 LAB — CBC WITH DIFFERENTIAL/PLATELET
Basophils Absolute: 0 10*3/uL (ref 0.0–0.1)
Basophils Relative: 0.1 % (ref 0.0–3.0)
Eosinophils Absolute: 0.3 10*3/uL (ref 0.0–0.7)
Eosinophils Relative: 4.3 % (ref 0.0–5.0)
HCT: 48.1 % (ref 39.0–52.0)
Hemoglobin: 16.5 g/dL (ref 13.0–17.0)
Lymphocytes Relative: 32 % (ref 12.0–46.0)
Lymphs Abs: 2.1 10*3/uL (ref 0.7–4.0)
MCHC: 34.3 g/dL (ref 30.0–36.0)
MCV: 97.7 fl (ref 78.0–100.0)
Monocytes Absolute: 0.6 10*3/uL (ref 0.1–1.0)
Monocytes Relative: 8.8 % (ref 3.0–12.0)
Neutro Abs: 3.6 10*3/uL (ref 1.4–7.7)
Neutrophils Relative %: 54.8 % (ref 43.0–77.0)
Platelets: 240 10*3/uL (ref 150.0–400.0)
RBC: 4.92 Mil/uL (ref 4.22–5.81)
RDW: 12.6 % (ref 11.5–14.6)
WBC: 6.5 10*3/uL (ref 4.5–10.5)

## 2011-01-26 LAB — D-DIMER, QUANTITATIVE: D-Dimer, Quant: 0.95 ug/mL-FEU — ABNORMAL HIGH (ref 0.00–0.48)

## 2011-01-26 LAB — TSH: TSH: 2.84 u[IU]/mL (ref 0.35–5.50)

## 2011-01-26 LAB — CK TOTAL AND CKMB (NOT AT ARMC)
CK, MB: 1.3 ng/mL (ref 0.3–4.0)
Total CK: 57 U/L (ref 7–232)

## 2011-01-26 LAB — TROPONIN I: Troponin I: <0.01 ng/mL (ref <0.06)

## 2011-01-26 MED ORDER — FUROSEMIDE 40 MG PO TABS: 40.0000 mg | ORAL_TABLET | Freq: Every day | ORAL | Status: DC

## 2011-01-26 NOTE — Patient Instructions (Addendum)
Please go to the [facility]  facility on [facility] for the chest x-ray. If the swelling and fast heart rate do not improve with the diuretic additional evaluation will be necessary.   I called and  discussed the results with his wife. He is having no cardiopulmonary symptoms. I asked her to take him to the emergency room should he have any chest pain or shortness of breath. The most likely cause of the tachycardia is the significant postop pain he is having.When  I called  he  taken a pain medicine & gone to bed.

## 2011-01-26 NOTE — Progress Notes (Signed)
  Subjective:    Patient ID: Bradley Navarro, male    DOB: 02/28/1941, 70 y.o.   MRN: [id number]  HPI Edema Onset:01/23/11; S/P discectomy 8/2 @ [facility]  Constitutional: no fatigue; sleep disturbance  (no PMH sleep apnea)                                                       Cardiovascular: no palpitations; tachycardia;vascular  claudication; paroxysmal nocturnal dyspnea;chest pain Pulmonary;no cough; sputum reduction; exertional dyspnea; pleuritic pain; hemoptysis Endocrinologic:no weight change; temperature intolerance; skin/hair/nail changes Possible triggers:no salt  excess; new medications  (not on Amlodipine)  PMH: no DVT or PTE. He gives a history of right-sided "valve prolapse" in the 1970s. He is unsure as to whether he had a heart attack @ that time. FH: raised as foster child    Review of Systems he denies fever, chills, or sweats.  He had neurogenic claudication in the right leg which is 90% better following surgery     Objective:   Physical Exam he is in no acute distress; and no increased work of breathing.  He exhibits an S4 gallop a right 110. No significant murmurs or rubs.  Chest is clear to auscultation without rubs, rhonchi ,rales, or wheezes  Abdomen is quiet without clinical ileus; no tenderness noted  All pulses are intact with no bruits. The dorsalis pedis pulses are slightly decreased  He has no significant clubbing or joint deformity. He does have 1/2-1+ pedal edema.  Toenails appear slightly bluish; there is no cyanosis of the finger nails.  He is in discomfort clinically and unable to get on the exam table due to his recent back surgery  Homan sign is negative in the calves.        Assessment & Plan:  #1 edema  #2 tachycardia  #3 ill-defined history of valvular heart disease or possible coronary disease in the 1970s  #4 status post surgery 8/2  Plan: EKG will be performed to rule out acute cardiac issues  Cardiac enzymes  and d-dimer will be performed to screen for cardiac insult or silent  pulmonary embolism. Clinically there is no evidence of deep venous thrombosis.

## 2011-03-22 LAB — URINALYSIS, ROUTINE W REFLEX MICROSCOPIC
Bilirubin Urine: NEGATIVE
Glucose, UA: NEGATIVE
Nitrite: NEGATIVE
Protein, ur: 100 — AB
Specific Gravity, Urine: 1.03
Urobilinogen, UA: 1
pH: 6

## 2011-03-22 LAB — URINE MICROSCOPIC-ADD ON

## 2011-03-30 ENCOUNTER — Ambulatory Visit (INDEPENDENT_AMBULATORY_CARE_PROVIDER_SITE_OTHER): Payer: Medicare HMO

## 2011-03-30 DIAGNOSIS — Z23 Encounter for immunization: Secondary | ICD-10-CM

## 2011-05-05 ENCOUNTER — Ambulatory Visit (INDEPENDENT_AMBULATORY_CARE_PROVIDER_SITE_OTHER): Payer: Medicare HMO | Admitting: Internal Medicine

## 2011-05-05 ENCOUNTER — Encounter: Payer: Self-pay | Admitting: Internal Medicine

## 2011-05-05 VITALS — BP 140/80 | HR 84 | Temp 98.0°F | Resp 16 | Wt 228.6 lb

## 2011-05-05 DIAGNOSIS — J069 Acute upper respiratory infection, unspecified: Secondary | ICD-10-CM

## 2011-05-05 MED ORDER — HYDROCODONE-HOMATROPINE 5-1.5 MG/5ML PO SYRP: 5.0000 mL | ORAL_SOLUTION | Freq: Every evening | ORAL | Status: AC | PRN

## 2011-05-05 MED ORDER — PREDNISONE 10 MG PO TABS: ORAL_TABLET | ORAL | Status: AC

## 2011-05-05 MED ORDER — AZITHROMYCIN 250 MG PO TABS: ORAL_TABLET | ORAL | Status: AC

## 2011-05-05 NOTE — Patient Instructions (Addendum)
Rest, fluids , tylenol For cough, take Mucinex DM twice a day as needed  For night time cough use hydrocodone (don't mix it w/  Oxycodone) Take the antibiotic as prescribed ----> zithromax  Call if no better in few days Call anytime if the symptoms are severe

## 2011-05-05 NOTE — Progress Notes (Signed)
  Subjective:    Patient ID: Bradley Navarro, male    DOB: Mar 20, 1941, 70 y.o.   MRN: [id number]  HPI Symptoms started 3 days ago with nose congestion, chest congestion, cough, green nasal discharge and green sputum. Unable to sleep due to cough   Past Medical History: long h/o HA-- s/p extensive w/u in the past. Has seen Dr Richardean Chimera and neuro @ [facility]  h/o kidney stones   Past Surgical History: Appendectomy R arm surgery  Back surgery (L5) 01-20-11  Social History:  Married children 2 and 2 step daughters Never Smoked Alcohol use-no Drug use-no Regular exercise-yes occupation-- retired    Review of Systems No fever or chills No nausea or vomiting No myalgias per se    Objective:   Physical Exam  Constitutional: He appears well-developed and well-nourished.  HENT:  Head: Normocephalic and atraumatic.       Face symmetric, nontender to palpation. Nose quite congested. Throat without redness  Cardiovascular: Normal rate, regular rhythm and normal heart sounds.   No murmur heard. Pulmonary/Chest: Effort normal and breath sounds normal. No respiratory distress. He has no wheezes. He has no rales.          Assessment & Plan:  URI: Symptoms consistent with mild bronchitis and possibly early sinusitis. We'll treat with Zithromax, hydrocodone and cough medicine. I wrote a prescription for prednisone however the patient states that he just finished   steroids prescribed by his orthopedic doctor consequently I voided the prescription.

## 2011-09-10 ENCOUNTER — Emergency Department (HOSPITAL_BASED_OUTPATIENT_CLINIC_OR_DEPARTMENT_OTHER)
Admission: EM | Admit: 2011-09-10 | Discharge: 2011-09-10 | Disposition: A | Discharge status: Home / Self Care | Payer: Medicare HMO | Attending: Emergency Medicine | Admitting: Emergency Medicine

## 2011-09-10 ENCOUNTER — Encounter (HOSPITAL_BASED_OUTPATIENT_CLINIC_OR_DEPARTMENT_OTHER): Payer: Self-pay | Admitting: *Deleted

## 2011-09-10 ENCOUNTER — Emergency Department (INDEPENDENT_AMBULATORY_CARE_PROVIDER_SITE_OTHER): Payer: Medicare HMO

## 2011-09-10 DIAGNOSIS — R112 Nausea with vomiting, unspecified: Secondary | ICD-10-CM | POA: Insufficient documentation

## 2011-09-10 DIAGNOSIS — N200 Calculus of kidney: Secondary | ICD-10-CM | POA: Insufficient documentation

## 2011-09-10 DIAGNOSIS — R109 Unspecified abdominal pain: Secondary | ICD-10-CM | POA: Insufficient documentation

## 2011-09-10 DIAGNOSIS — K5730 Diverticulosis of large intestine without perforation or abscess without bleeding: Secondary | ICD-10-CM | POA: Insufficient documentation

## 2011-09-10 LAB — URINALYSIS, ROUTINE W REFLEX MICROSCOPIC
Bilirubin Urine: NEGATIVE
Glucose, UA: NEGATIVE mg/dL
Hgb urine dipstick: NEGATIVE
Ketones, ur: 15 mg/dL — AB
Nitrite: NEGATIVE
Protein, ur: NEGATIVE mg/dL
Specific Gravity, Urine: 1.011 (ref 1.005–1.030)
Urobilinogen, UA: 0.2 mg/dL (ref 0.0–1.0)
pH: 7 (ref 5.0–8.0)

## 2011-09-10 LAB — URINE MICROSCOPIC-ADD ON

## 2011-09-10 MED ORDER — ONDANSETRON HCL 4 MG/2ML IJ SOLN
INTRAMUSCULAR | Status: AC
  Administered 2011-09-10: 4 mg via INTRAVENOUS
  Filled 2011-09-10: qty 2

## 2011-09-10 MED ORDER — HYDROMORPHONE HCL PF 2 MG/ML IJ SOLN
INTRAMUSCULAR | Status: AC
  Administered 2011-09-10: 2 mg
  Filled 2011-09-10: qty 1

## 2011-09-10 MED ORDER — HYDROMORPHONE HCL PF 1 MG/ML IJ SOLN
1.0000 mg | Freq: Once | INTRAMUSCULAR | Status: AC
  Administered 2011-09-10: 1 mg via INTRAVENOUS
  Filled 2011-09-10: qty 1

## 2011-09-10 MED ORDER — ONDANSETRON HCL 4 MG/2ML IJ SOLN
4.0000 mg | Freq: Once | INTRAMUSCULAR | Status: AC
  Administered 2011-09-10: 4 mg via INTRAVENOUS

## 2011-09-10 MED ORDER — HYDROMORPHONE HCL PF 1 MG/ML IJ SOLN: 2.0000 mg | Freq: Once | INTRAMUSCULAR | Status: AC

## 2011-09-10 MED ORDER — SODIUM CHLORIDE 0.9 % IV BOLUS (SEPSIS): 500.0000 mL | Freq: Once | INTRAVENOUS | Status: DC

## 2011-09-10 MED ORDER — CIPROFLOXACIN HCL 500 MG PO TABS: 500.0000 mg | ORAL_TABLET | Freq: Two times a day (BID) | ORAL | Status: DC

## 2011-09-10 MED ORDER — HYDROMORPHONE HCL PF 1 MG/ML IJ SOLN
INTRAMUSCULAR | Status: AC
  Filled 2011-09-10: qty 1

## 2011-09-10 MED ORDER — SODIUM CHLORIDE 0.9 % IV SOLN
Freq: Once | INTRAVENOUS | Status: AC
Start: 2011-09-10 — End: 2011-09-10
  Administered 2011-09-10: 16:00:00 via INTRAVENOUS

## 2011-09-10 MED ORDER — ONDANSETRON HCL 4 MG PO TABS: 4.0000 mg | ORAL_TABLET | Freq: Four times a day (QID) | ORAL | Status: DC

## 2011-09-10 MED ORDER — CIPROFLOXACIN HCL 500 MG PO TABS: 500.0000 mg | ORAL_TABLET | Freq: Two times a day (BID) | ORAL | Status: AC

## 2011-09-10 MED ORDER — ONDANSETRON HCL 4 MG PO TABS: 4.0000 mg | ORAL_TABLET | Freq: Four times a day (QID) | ORAL | Status: AC

## 2011-09-10 NOTE — ED Notes (Signed)
Pt has hx of kidney stones and is now c/o left flank pain. +nausea and vomiting

## 2011-09-10 NOTE — Discharge Instructions (Signed)
Urinary Tract Infection  Infections of the urinary tract can start in several places. A bladder infection (cystitis), a kidney infection (pyelonephritis), and a prostate infection (prostatitis) are different types of urinary tract infections (UTIs). They usually get better if treated with medicines (antibiotics) that kill germs. Take all the medicine until it is gone. You or your child may feel better in a few days, but TAKE ALL MEDICINE or the infection may not respond and may become more difficult to treat.  HOME CARE INSTRUCTIONS    Drink enough water and fluids to keep the urine clear or pale yellow. Cranberry juice is especially recommended, in addition to large amounts of water.   Avoid caffeine, tea, and carbonated beverages. They tend to irritate the bladder.   Alcohol may irritate the prostate.   Only take over-the-counter or prescription medicines for pain, discomfort, or fever as directed by your caregiver.  To prevent further infections:   Empty the bladder often. Avoid holding urine for long periods of time.   After a bowel movement, women should cleanse from front to back. Use each tissue only once.   Empty the bladder before and after sexual intercourse.  FINDING OUT THE RESULTS OF YOUR TEST  Not all test results are available during your visit. If your or your child's test results are not back during the visit, make an appointment with your caregiver to find out the results. Do not assume everything is normal if you have not heard from your caregiver or the medical facility. It is important for you to follow up on all test results.  SEEK MEDICAL CARE IF:    There is back pain.   Your baby is older than 3 months with a rectal temperature of 100.5 F (38.1 C) or higher for more than 1 day.   Your or your child's problems (symptoms) are no better in 3 days. Return sooner if you or your child is getting worse.  SEEK IMMEDIATE MEDICAL CARE IF:    There is severe back pain or lower abdominal  pain.   You or your child develops chills.   You have a fever.   Your baby is older than 3 months with a rectal temperature of 102 F (38.9 C) or higher.   Your baby is 3 months old or younger with a rectal temperature of 100.4 F (38 C) or higher.   There is nausea or vomiting.   There is continued burning or discomfort with urination.  MAKE SURE YOU:    Understand these instructions.   Will watch your condition.   Will get help right away if you are not doing well or get worse.  Document Released: 03/16/2005 Document Revised: 05/26/2011 Document Reviewed: 10/19/2006  Exit[employer] Patient Information 2012 [employer]  Flank Pain  Flank pain refers to pain that is located on the side of the body between the upper abdomen and the back. It can be caused by many things.  CAUSES   Some of the more common causes of flank pain include:   Muscle strain.   Muscle spasms.   A disease of your spine (vertebral disk disease).   A lung infection (pneumonia).   Fluid around your lungs (pulmonary edema).   A kidney infection.   Kidney stones.   A very painful skin rash on only one side of your body (shingles).   Gallbladder disease.  DIAGNOSIS   Blood tests, urine tests, and X-rays may help your caregiver determine what is wrong.  TREATMENT     The treatment of pain depends on the cause. Your caregiver will determine what treatment will work best for you.  HOME CARE INSTRUCTIONS    Home care will depend on the cause of your pain.   Some medications may help relieve the pain. Take medication for relief of pain as directed by your caregiver.   Tell your caregiver about any changes in your pain.   Follow up with your caregiver.  SEEK IMMEDIATE MEDICAL CARE IF:    Your pain is not controlled with medication.   The pain increases.   You have abdominal pain.   You have shortness of breath.   You have persistent nausea or vomiting.   You have swelling in your abdomen.   You feel faint or pass out.   You  have a temperature by mouth above 102 F (38.9 C), not controlled by medicine.  MAKE SURE YOU:    Understand these instructions.   Will watch your condition.   Will get help right away if you are not doing well or get worse.  Document Released: 07/28/2005 Document Revised: 05/26/2011 Document Reviewed: 11/21/2009  [employer] Patient Information 2012 [employer]

## 2011-09-10 NOTE — ED Provider Notes (Signed)
History  This chart was scribed for Toy Baker, MD by Bennett Scrape. This patient was seen in room MH02/MH02 and the patient's care was started at 3:54PM.  CSN: [id number]  Arrival date & time 09/10/11  1530   First MD Initiated Contact with Patient 09/10/11 1553      Chief Complaint  Patient presents with  . Flank Pain    The history is provided by the patient. No language interpreter was used.    Bradley Navarro is a 71 y.o. male with a h/o kidney stones who presents to the Emergency Department complaining of gradual onset, gradually worsening, intermittent 2 days of left flank pain.  He lists emesis and nausea as associated symptoms. He denies modifying factors. He has not taken any medication PTA to improve symptoms. He reports that he has pain medications that he takes regularly for back pain but states that he has not taken them since the onset of the nausea. He states that the symptoms he is experiencing now are similar to previous kidney stone experiences. He denies fever, hematuria and dysuria as associated symptoms. He has a h/o HA. He is a former smoker and current alcohol user.    Pt is followed by Kathrynn Running for urology.    Past Medical History  Diagnosis Date  . History of kidney stones   . History of headache     frontal lobe cluster headaches    Past Surgical History  Procedure Date  . Appendectomy   . Lumbar disc surgery 2012    L-5, Dr Elizabeth Sauer    History reviewed. No pertinent family history.  History  Substance Use Topics  . Smoking status: Former Games developer  . Smokeless tobacco: Not on file   Comment: 21 years ago as of 2012  . Alcohol Use: Yes      Review of Systems  Constitutional: Negative for fever and chills.  HENT: Negative for ear pain, congestion, sore throat and neck stiffness.   Eyes: Negative for pain.  Respiratory: Negative for cough and shortness of breath.   Cardiovascular: Negative for chest pain.  Gastrointestinal: Positive  for nausea and vomiting. Negative for diarrhea and anal bleeding.  Genitourinary: Positive for flank pain (Left). Negative for dysuria, urgency and hematuria.  Musculoskeletal: Negative for back pain.  Skin: Negative for rash.  Neurological: Negative for seizures and headaches.  Psychiatric/Behavioral: Negative for confusion.    Allergies  Penicillins  Home Medications   Current Outpatient Rx  Name Route Sig Dispense Refill  . ASPIRIN 81 MG PO TABS Oral Take 81 mg by mouth daily.    Marland Kitchen BACLOFEN 20 MG PO TABS Oral Take 20 mg by mouth 3 (three) times daily.    Marland Kitchen METOCLOPRAMIDE HCL 10 MG PO TABS Oral Take 10 mg by mouth every 6 (six) hours as needed. For nausea    . MORPHINE SULFATE ER 30 MG PO TB12 Oral Take 30-60 mg by mouth every 12 (twelve) hours.    . ADULT MULTIVITAMIN W/MINERALS CH Oral Take 1 tablet by mouth daily.    . OXYCODONE HCL 15 MG PO TABS Oral Take 15-30 mg by mouth every 6 (six) hours as needed. For pain      Triage Vitals: BP 169/97  Pulse 126  Temp(Src) 97.5 F (36.4 C) (Oral)  Resp 36  Ht 5\' 10"  (1.778 m)  Wt 230 lb (104.327 kg)  BMI 33.00 kg/m2  SpO2 100%  Physical Exam  Nursing note and vitals reviewed. Constitutional: He is  oriented to person, place, and time. He appears well-developed and well-nourished.  HENT:  Head: Normocephalic and atraumatic.  Eyes: Conjunctivae and EOM are normal.  Neck: Normal range of motion. Neck supple.  Cardiovascular: Normal rate and regular rhythm.   Pulmonary/Chest: Effort normal and breath sounds normal.  Abdominal: Soft. He exhibits no distension. There is no tenderness. There is no rebound and no guarding.       No CVA tenderness  Genitourinary:       No scrotal swelling  Musculoskeletal: Normal range of motion. He exhibits no edema.  Neurological: He is alert and oriented to person, place, and time. No cranial nerve deficit.  Skin: Skin is warm and dry.  Psychiatric: He has a normal mood and affect. His behavior  is normal.    ED Course  Procedures (including critical care time)  DIAGNOSTIC STUDIES: Oxygen Saturation is 100% on room air, normal by my interpretation.    COORDINATION OF CARE: 4:00Pm-Discussed treatment plan with pt and pt agreed to plan. Discussed CT scan of abdomen order and pt agreed.   Labs Reviewed  URINALYSIS, ROUTINE W REFLEX MICROSCOPIC - Abnormal; Notable for the following:    Ketones, ur 15 (*)    Leukocytes, UA TRACE (*)    All other components within normal limits  URINE MICROSCOPIC-ADD ON - Abnormal; Notable for the following:    Bacteria, UA FEW (*)    All other components within normal limits    Ct Abdomen Pelvis Wo Contrast  09/10/2011  *RADIOLOGY REPORT*  Clinical Data: Left flank pain.  Nausea and vomiting. Urolithiasis.  CT ABDOMEN AND PELVIS WITHOUT CONTRAST  Technique:  Multidetector CT imaging of the abdomen and pelvis was performed following the standard protocol without intravenous contrast.  Comparison: 05/17/2008  Findings: A tiny 2 mm calculus is seen in the lower pole of the right kidney.  No other renal calculi identified.  No evidence of hydronephrosis.  No evidence of ureteral calculi or dilatation.  No bladder calculi identified. No evidence of perinephric fluid or inflammatory changes.  The other abdominal parenchymal organs have a normal appearance on this noncontrast study.  Gallbladder is unremarkable.  No soft tissue masses or lymphadenopathy identified within the abdomen or pelvis.  No evidence of acute inflammatory process or abnormal fluid collections.  Misty soft tissue density is seen within the central small bowel mesentery, which is unchanged. No evidence of bowel wall thickening or dilatation.  Mild diverticulosis is seen in the proximal sigmoid colon, however there is no evidence of diverticulitis.  IMPRESSION:  1.  No evidence of ureteral calculi, hydronephrosis, or other acute findings. 2.  2 mm nonobstructing right renal calculus. 3.  Mild  sigmoid diverticulosis.  No radiographic evidence of diverticulitis.  Original Report Authenticated By: Danae Orleans, M.D.     No diagnosis found.    MDM  Patient given medication for pain and IV fluids. He does look better at this time he is pain-free. We placed on antibiotics and give anti-medics and will followup with his Dr. as needed   I personally performed the services described in this documentation, which was scribed in my presence. The recorded information has been reviewed and considered.       Toy Baker, MD 09/10/11 [phone]

## 2011-09-23 ENCOUNTER — Telehealth: Payer: Self-pay | Admitting: Internal Medicine

## 2011-09-23 NOTE — Telephone Encounter (Signed)
Please advise 

## 2011-09-23 NOTE — Telephone Encounter (Signed)
Called pt to set up CPE in order for paperwork to be filled out for [facility]. Pt states he is currently under the care of Dr. Nolon Bussing? (regional physicians) for his post-surgery care and will call us when he is cleared to schedule appt.

## 2011-09-26 NOTE — Telephone Encounter (Signed)
Noted, will do paper work at time of CPX

## 2011-11-08 ENCOUNTER — Ambulatory Visit (INDEPENDENT_AMBULATORY_CARE_PROVIDER_SITE_OTHER)
Admission: RE | Admit: 2011-11-08 | Discharge: 2011-11-08 | Disposition: A | Discharge status: Home / Self Care | Payer: Medicare HMO | Source: Ambulatory Visit | Attending: Internal Medicine | Admitting: Internal Medicine

## 2011-11-08 ENCOUNTER — Ambulatory Visit (INDEPENDENT_AMBULATORY_CARE_PROVIDER_SITE_OTHER): Payer: Medicare HMO | Admitting: Internal Medicine

## 2011-11-08 VITALS — BP 132/84 | HR 90 | Temp 98.4°F | Wt 226.0 lb

## 2011-11-08 DIAGNOSIS — M2550 Pain in unspecified joint: Secondary | ICD-10-CM

## 2011-11-08 DIAGNOSIS — Z79899 Other long term (current) drug therapy: Secondary | ICD-10-CM

## 2011-11-08 DIAGNOSIS — IMO0002 Reserved for concepts with insufficient information to code with codable children: Secondary | ICD-10-CM

## 2011-11-08 LAB — CBC WITH DIFFERENTIAL/PLATELET
Basophils Absolute: 0 10*3/uL (ref 0.0–0.1)
Basophils Relative: 0.6 % (ref 0.0–3.0)
Eosinophils Absolute: 0.2 10*3/uL (ref 0.0–0.7)
Eosinophils Relative: 3.2 % (ref 0.0–5.0)
HCT: 44.1 % (ref 39.0–52.0)
Hemoglobin: 14.9 g/dL (ref 13.0–17.0)
Lymphocytes Relative: 39.5 % (ref 12.0–46.0)
Lymphs Abs: 2.4 10*3/uL (ref 0.7–4.0)
MCHC: 33.9 g/dL (ref 30.0–36.0)
MCV: 94.2 fl (ref 78.0–100.0)
Monocytes Absolute: 0.5 10*3/uL (ref 0.1–1.0)
Monocytes Relative: 8.3 % (ref 3.0–12.0)
Neutro Abs: 2.9 10*3/uL (ref 1.4–7.7)
Neutrophils Relative %: 48.4 % (ref 43.0–77.0)
Platelets: 208 10*3/uL (ref 150.0–400.0)
RBC: 4.68 Mil/uL (ref 4.22–5.81)
RDW: 13.5 % (ref 11.5–14.6)
WBC: 6.1 10*3/uL (ref 4.5–10.5)

## 2011-11-08 LAB — SEDIMENTATION RATE: Sed Rate: 8 mm/hr (ref 0–22)

## 2011-11-08 NOTE — Assessment & Plan Note (Signed)
Status post back surgery, now has chronic pain, sees pain management. Was recommended to have steroid shots, waiting for insurance approval

## 2011-11-08 NOTE — Progress Notes (Signed)
  Subjective:    Patient ID: Bradley Navarro, male    DOB: 1940/10/28, 71 y.o.   MRN: [id number]  HPI Acute visit One month history of B hand pain. The pain is mostly in the knuckles and joints of the hand, described as soreness, worse when he uses his hands, he also has some stiffness. Symptoms are symmetric, has not taken any new meds, does take pain medication prescribed for his back.  Past Medical History: long h/o HA-- s/p extensive w/u in the past. Has seen Dr Richardean Chimera and neuro @ [facility]   h/o kidney stones  Chronic back pain-- sees pain management  Past Surgical History: Appendectomy R arm surgery   Back surgery (L5) 01-20-11  Social History: Married children 2 and 2 step daughters Never Smoked Alcohol use-no Drug use-no    Review of Systems Not using any new medicines. Denies any fever, rash or conjunctivitis type of symptoms. Denies pain in all other joints like shoulders or elbows. He does have chronic back pain.    Objective:   Physical Exam General -- alert, well-developed. No apparent distress.  Neck --no thyromegaly  Lungs -- normal respiratory effort, no intercostal retractions, no accessory muscle use, and normal breath sounds.   Heart-- normal rate, regular rhythm, no murmur, and no gallop.   Extremities--  no pretibial edema bilaterally  Wrists and hands: Normal to inspection on palpation except for some tenderness at the PIPs > DIPs, ? mild swelling at the [facility]-- alert & oriented X3  Psych-- Cognition and judgment appear intact. Alert and cooperative with normal attention span and concentration.  not anxious appearing and not depressed appearing.       Assessment & Plan:

## 2011-11-08 NOTE — Assessment & Plan Note (Addendum)
Symmetric polyarthralgia of the small joints. Exam confirms tenderness @ the PIPs, no obvious synovitis. Plan: X-rays, labs including a sedimentation rate, rheumatoid factor, ANA. CBC and TSH. Motrin as needed May need a rheumatology consult or trial with steroids

## 2011-11-08 NOTE — Patient Instructions (Addendum)
Motrin 200 mg tablets over-the-counter: 2 tablets every 6 hours as needed, always take with food, watch for stomach irritation. --- Please get your x-ray at the other [place]  office located at: [address] [place], across from [facility].  Please go to the basement, this is a walk-in facility, they are open from 8:30 to 5:30 PM. Phone number [phone].

## 2011-11-09 ENCOUNTER — Encounter: Payer: Self-pay | Admitting: Internal Medicine

## 2011-11-09 LAB — ANA: Anti Nuclear Antibody(ANA): NEGATIVE

## 2011-11-09 LAB — RHEUMATOID FACTOR: Rhuematoid fact SerPl-aCnc: <10 IU/mL (ref <=14)

## 2011-11-09 LAB — TSH: TSH: 4.62 u[IU]/mL (ref 0.35–5.50)

## 2012-03-28 ENCOUNTER — Ambulatory Visit (INDEPENDENT_AMBULATORY_CARE_PROVIDER_SITE_OTHER): Payer: Medicare HMO

## 2012-03-28 DIAGNOSIS — Z23 Encounter for immunization: Secondary | ICD-10-CM

## 2012-10-19 ENCOUNTER — Other Ambulatory Visit: Payer: Self-pay | Admitting: Orthopaedic Surgery

## 2012-10-19 DIAGNOSIS — M545 Low back pain, unspecified: Secondary | ICD-10-CM

## 2012-10-22 ENCOUNTER — Ambulatory Visit
Admission: RE | Admit: 2012-10-22 | Discharge: 2012-10-22 | Disposition: A | Discharge status: Home / Self Care | Payer: Medicare Other | Source: Ambulatory Visit | Attending: Orthopaedic Surgery | Admitting: Orthopaedic Surgery

## 2012-10-22 DIAGNOSIS — M545 Low back pain, unspecified: Secondary | ICD-10-CM

## 2013-03-20 ENCOUNTER — Ambulatory Visit (INDEPENDENT_AMBULATORY_CARE_PROVIDER_SITE_OTHER): Payer: Medicare Other

## 2013-03-20 DIAGNOSIS — Z23 Encounter for immunization: Secondary | ICD-10-CM

## 2013-06-20 HISTORY — PX: LUMBAR FUSION: SHX111

## 2013-06-21 ENCOUNTER — Telehealth: Payer: Self-pay | Admitting: *Deleted

## 2013-06-21 NOTE — Telephone Encounter (Signed)
06/21/2013  Pt came by office this morning.  He has appt this coming up Tuesday for an injection with Dr.Thomas R. Maia Petties, MD at [facility].  That office told pt they needed a referral number [employer] will have to give Dr. Larose Kells to be able to send claim to [facility].  Please advise.  bw

## 2013-06-21 NOTE — Telephone Encounter (Signed)
[place], please advise.

## 2013-06-24 NOTE — Telephone Encounter (Signed)
Referral faxed to [facility]. Awaiting approval.

## 2014-01-23 ENCOUNTER — Other Ambulatory Visit: Payer: Self-pay | Admitting: Orthopaedic Surgery

## 2014-01-23 DIAGNOSIS — M961 Postlaminectomy syndrome, not elsewhere classified: Secondary | ICD-10-CM

## 2014-01-30 ENCOUNTER — Other Ambulatory Visit: Payer: Self-pay

## 2014-01-30 ENCOUNTER — Ambulatory Visit
Admission: RE | Admit: 2014-01-30 | Discharge: 2014-01-30 | Disposition: A | Discharge status: Home / Self Care | Payer: Medicare HMO | Source: Ambulatory Visit | Attending: Orthopaedic Surgery | Admitting: Orthopaedic Surgery

## 2014-01-30 DIAGNOSIS — M961 Postlaminectomy syndrome, not elsewhere classified: Secondary | ICD-10-CM

## 2014-02-09 ENCOUNTER — Emergency Department (HOSPITAL_BASED_OUTPATIENT_CLINIC_OR_DEPARTMENT_OTHER)
Admission: EM | Admit: 2014-02-09 | Discharge: 2014-02-09 | Disposition: A | Discharge status: Home / Self Care | Payer: Medicare HMO | Attending: Emergency Medicine | Admitting: Emergency Medicine

## 2014-02-09 ENCOUNTER — Encounter (HOSPITAL_BASED_OUTPATIENT_CLINIC_OR_DEPARTMENT_OTHER): Payer: Self-pay | Admitting: Emergency Medicine

## 2014-02-09 DIAGNOSIS — R110 Nausea: Secondary | ICD-10-CM | POA: Diagnosis not present

## 2014-02-09 DIAGNOSIS — G8929 Other chronic pain: Secondary | ICD-10-CM | POA: Insufficient documentation

## 2014-02-09 DIAGNOSIS — Z87891 Personal history of nicotine dependence: Secondary | ICD-10-CM | POA: Insufficient documentation

## 2014-02-09 DIAGNOSIS — R197 Diarrhea, unspecified: Secondary | ICD-10-CM | POA: Insufficient documentation

## 2014-02-09 DIAGNOSIS — K2990 Gastroduodenitis, unspecified, without bleeding: Principal | ICD-10-CM

## 2014-02-09 DIAGNOSIS — Z87442 Personal history of urinary calculi: Secondary | ICD-10-CM | POA: Insufficient documentation

## 2014-02-09 DIAGNOSIS — R6883 Chills (without fever): Secondary | ICD-10-CM | POA: Diagnosis not present

## 2014-02-09 DIAGNOSIS — Z79899 Other long term (current) drug therapy: Secondary | ICD-10-CM | POA: Insufficient documentation

## 2014-02-09 DIAGNOSIS — Z880 Allergy status to penicillin: Secondary | ICD-10-CM | POA: Diagnosis not present

## 2014-02-09 DIAGNOSIS — Z7982 Long term (current) use of aspirin: Secondary | ICD-10-CM | POA: Insufficient documentation

## 2014-02-09 DIAGNOSIS — M549 Dorsalgia, unspecified: Secondary | ICD-10-CM | POA: Insufficient documentation

## 2014-02-09 DIAGNOSIS — K2970 Gastritis, unspecified, without bleeding: Secondary | ICD-10-CM | POA: Insufficient documentation

## 2014-02-09 DIAGNOSIS — R109 Unspecified abdominal pain: Secondary | ICD-10-CM | POA: Insufficient documentation

## 2014-02-09 HISTORY — DX: Other chronic pain: G89.29

## 2014-02-09 HISTORY — DX: Dorsalgia, unspecified: M54.9

## 2014-02-09 LAB — COMPREHENSIVE METABOLIC PANEL
ALT: 18 U/L (ref 0–53)
AST: 18 U/L (ref 0–37)
Albumin: 4.6 g/dL (ref 3.5–5.2)
Alkaline Phosphatase: 79 U/L (ref 39–117)
Anion gap: 17 — ABNORMAL HIGH (ref 5–15)
BUN: 16 mg/dL (ref 6–23)
CO2: 24 mEq/L (ref 19–32)
Calcium: 10.3 mg/dL (ref 8.4–10.5)
Chloride: 100 mEq/L (ref 96–112)
Creatinine, Ser: 1 mg/dL (ref 0.50–1.35)
GFR calc Af Amer: 85 mL/min — ABNORMAL LOW (ref >90)
GFR calc non Af Amer: 74 mL/min — ABNORMAL LOW (ref >90)
Glucose, Bld: 123 mg/dL — ABNORMAL HIGH (ref 70–99)
Potassium: 3.9 mEq/L (ref 3.7–5.3)
Sodium: 141 mEq/L (ref 137–147)
Total Bilirubin: 0.8 mg/dL (ref 0.3–1.2)
Total Protein: 8 g/dL (ref 6.0–8.3)

## 2014-02-09 LAB — URINALYSIS, ROUTINE W REFLEX MICROSCOPIC
Bilirubin Urine: NEGATIVE
Glucose, UA: NEGATIVE mg/dL
Hgb urine dipstick: NEGATIVE
Ketones, ur: NEGATIVE mg/dL
Leukocytes, UA: NEGATIVE
Nitrite: NEGATIVE
Protein, ur: NEGATIVE mg/dL
Specific Gravity, Urine: 1.007 (ref 1.005–1.030)
Urobilinogen, UA: 0.2 mg/dL (ref 0.0–1.0)
pH: 6 (ref 5.0–8.0)

## 2014-02-09 LAB — CBC WITH DIFFERENTIAL/PLATELET
Basophils Absolute: 0 10*3/uL (ref 0.0–0.1)
Basophils Relative: 0 % (ref 0–1)
Eosinophils Absolute: 0 10*3/uL (ref 0.0–0.7)
Eosinophils Relative: 0 % (ref 0–5)
HCT: 42.9 % (ref 39.0–52.0)
Hemoglobin: 15.2 g/dL (ref 13.0–17.0)
Lymphocytes Relative: 25 % (ref 12–46)
Lymphs Abs: 2.5 10*3/uL (ref 0.7–4.0)
MCH: 32.1 pg (ref 26.0–34.0)
MCHC: 35.4 g/dL (ref 30.0–36.0)
MCV: 90.7 fL (ref 78.0–100.0)
Monocytes Absolute: 1 10*3/uL (ref 0.1–1.0)
Monocytes Relative: 10 % (ref 3–12)
Neutro Abs: 6.5 10*3/uL (ref 1.7–7.7)
Neutrophils Relative %: 64 % (ref 43–77)
Platelets: 251 10*3/uL (ref 150–400)
RBC: 4.73 MIL/uL (ref 4.22–5.81)
RDW: 12.1 % (ref 11.5–15.5)
WBC: 10.1 10*3/uL (ref 4.0–10.5)

## 2014-02-09 LAB — LIPASE, BLOOD: Lipase: 15 U/L (ref 11–59)

## 2014-02-09 MED ORDER — PROMETHAZINE HCL 25 MG/ML IJ SOLN
12.5000 mg | Freq: Once | INTRAMUSCULAR | Status: AC
  Administered 2014-02-09: 12.5 mg via INTRAVENOUS
  Filled 2014-02-09: qty 1

## 2014-02-09 MED ORDER — PANTOPRAZOLE SODIUM 40 MG IV SOLR
40.0000 mg | Freq: Once | INTRAVENOUS | Status: AC
  Administered 2014-02-09: 40 mg via INTRAVENOUS
  Filled 2014-02-09: qty 40

## 2014-02-09 MED ORDER — ONDANSETRON HCL 4 MG/2ML IJ SOLN: 4.0000 mg | Freq: Once | INTRAMUSCULAR | Status: DC

## 2014-02-09 MED ORDER — ONDANSETRON 4 MG PO TBDP: 4.0000 mg | ORAL_TABLET | Freq: Three times a day (TID) | ORAL | Status: DC | PRN

## 2014-02-09 MED ORDER — ONDANSETRON HCL 4 MG/2ML IJ SOLN
4.0000 mg | Freq: Once | INTRAMUSCULAR | Status: AC
  Administered 2014-02-09: 4 mg via INTRAVENOUS
  Filled 2014-02-09: qty 2

## 2014-02-09 MED ORDER — MORPHINE SULFATE 4 MG/ML IJ SOLN
4.0000 mg | INTRAMUSCULAR | Status: DC | PRN
  Administered 2014-02-09: 4 mg via INTRAVENOUS
  Filled 2014-02-09: qty 1

## 2014-02-09 MED ORDER — SODIUM CHLORIDE 0.9 % IV BOLUS (SEPSIS)
1000.0000 mL | Freq: Once | INTRAVENOUS | Status: AC
  Administered 2014-02-09: 1000 mL via INTRAVENOUS

## 2014-02-09 MED ORDER — SODIUM CHLORIDE 0.9 % IV SOLN
Freq: Once | INTRAVENOUS | Status: AC
  Administered 2014-02-09: 21:00:00 via INTRAVENOUS

## 2014-02-09 MED ORDER — OMEPRAZOLE 20 MG PO CPDR: 20.0000 mg | DELAYED_RELEASE_CAPSULE | Freq: Two times a day (BID) | ORAL | Status: AC

## 2014-02-09 NOTE — Discharge Instructions (Signed)
Gastritis, Adult °Gastritis is soreness and puffiness (inflammation) of the lining of the stomach. If you do not get help, gastritis can cause bleeding and sores (ulcers) in the stomach. °HOME CARE  °· Only take medicine as told by your doctor. °· If you were given antibiotic medicines, take them as told. Finish the medicines even if you start to feel better. °· Drink enough fluids to keep your pee (urine) clear or pale yellow. °· Avoid foods and drinks that make your problems worse. Foods you may want to avoid include: °¨ Caffeine or alcohol. °¨ Chocolate. °¨ Mint. °¨ Garlic and onions. °¨ Spicy foods. °¨ Citrus fruits, including oranges, lemons, or limes. °¨ Food containing tomatoes, including sauce, chili, salsa, and pizza. °¨ Fried and fatty foods. °· Eat small meals throughout the day instead of large meals. °GET HELP RIGHT AWAY IF:  °· You have black or dark red poop (stools). °· You throw up (vomit) blood. It may look like coffee grounds. °· You cannot keep fluids down. °· Your belly (abdominal) pain gets worse. °· You have a fever. °· You do not feel better after 1 week. °· You have any other questions or concerns. °MAKE SURE YOU:  °· Understand these instructions. °· Will watch your condition. °· Will get help right away if you are not doing well or get worse. °Document Released: 11/23/2007 Document Revised: 08/29/2011 Document Reviewed: 07/20/2011 °ExitCare® Patient Information ©2015 [employer]. This information is not intended to replace advice given to you by your health care provider. Make sure you discuss any questions you have with your health care provider. ° °

## 2014-02-09 NOTE — ED Notes (Signed)
Pt has had nausea and diarrhea and abdominal pain since Friday.  Pt also has some left flank pain no other GU symptoms.

## 2014-02-09 NOTE — ED Provider Notes (Signed)
CSN: [id number]     Arrival date & time 02/09/14  1755 History  This chart was scribed for Tanna Furry, MD by Randa Evens, ED Scribe. This patient was seen in room MH10/MH10 and the patient's care was started at 6:33 PM.       Chief Complaint  Patient presents with  . Nausea  . Abdominal Pain   The history is provided by the patient. No language interpreter was used.   HPI Comments: Bradley Navarro is a 73 y.o. male who presents to the Emergency Department complaining of nausea onset 3 days prior. He state he is having associated diarrhea, abdominal pain, chills, and back pain. He states he visited his spine Doctor on Friday and was started on new medication ("Venaflaxine").  He states that he thinks his symptoms may be from the new medications he recently started. He states he has a Hx of kidney stones and Kidney infections. He denies vomiting.    Past Medical History  Diagnosis Date  . History of kidney stones   . History of headache     frontal lobe cluster headaches  . Chronic back pain    Past Surgical History  Procedure Laterality Date  . Appendectomy    . Lumbar disc surgery  2012    L-5, Dr Vennie Homans  . Lumbar fusion     No family history on file. History  Substance Use Topics  . Smoking status: Former Research scientist (life sciences)  . Smokeless tobacco: Not on file     Comment: 21 years ago as of 2012  . Alcohol Use: Yes    Review of Systems  Constitutional: Positive for chills. Negative for fever, diaphoresis, appetite change and fatigue.  HENT: Negative for mouth sores, sore throat and trouble swallowing.   Eyes: Negative for visual disturbance.  Respiratory: Negative for cough, chest tightness, shortness of breath and wheezing.   Cardiovascular: Negative for chest pain.  Gastrointestinal: Positive for nausea, abdominal pain and diarrhea. Negative for vomiting and abdominal distention.  Endocrine: Negative for polydipsia, polyphagia and polyuria.  Genitourinary: Negative for  dysuria, frequency and hematuria.  Musculoskeletal: Positive for back pain. Negative for gait problem.  Skin: Negative for color change, pallor and rash.  Neurological: Negative for dizziness, syncope, light-headedness and headaches.  Hematological: Does not bruise/bleed easily.  Psychiatric/Behavioral: Negative for behavioral problems and confusion.    Allergies  Penicillins  Home Medications   Prior to Admission medications   Medication Sig Start Date End Date Taking? Authorizing Provider  gabapentin (NEURONTIN) 600 MG tablet Take 1,200 mg by mouth every morning.   Yes Historical Provider, MD  HYDROcodone-acetaminophen (NORCO) 10-325 MG per tablet Take 1 tablet by mouth every 6 (six) hours as needed.   Yes Historical Provider, MD  morphine (MS CONTIN) 30 MG 12 hr tablet Take 30-60 mg by mouth every 12 (twelve) hours.   Yes Historical Provider, MD  Multiple Vitamin (MULITIVITAMIN WITH MINERALS) TABS Take 1 tablet by mouth daily.   Yes Historical Provider, MD  tizanidine (ZANAFLEX) 2 MG capsule Take 2 mg by mouth 3 (three) times daily as needed for muscle spasms.   Yes Historical Provider, MD  aspirin 81 MG tablet Take 81 mg by mouth daily.    Historical Provider, MD  baclofen (LIORESAL) 20 MG tablet Take 20 mg by mouth 3 (three) times daily.    Historical Provider, MD  metoCLOPramide (REGLAN) 10 MG tablet Take 10 mg by mouth every 6 (six) hours as needed. For nausea  Historical Provider, MD  omeprazole (PRILOSEC) 20 MG capsule Take 1 capsule (20 mg total) by mouth 2 (two) times daily. 02/09/14   Tanna Furry, MD  ondansetron (ZOFRAN ODT) 4 MG disintegrating tablet Take 1 tablet (4 mg total) by mouth every 8 (eight) hours as needed for nausea. 02/09/14   Tanna Furry, MD  oxyCODONE (ROXICODONE) 15 MG immediate release tablet Take 15-30 mg by mouth every 6 (six) hours as needed. For pain    Historical Provider, MD   Triage Vitals: BP 157/82  Pulse 106  Temp(Src) 98.2 F (36.8 C) (Oral)   Resp 20  Ht 5\' 10"  (1.778 m)  Wt 200 lb (90.719 kg)  BMI 28.70 kg/m2  SpO2 100%  Physical Exam  Nursing note and vitals reviewed. Constitutional: He is oriented to person, place, and time. He appears well-developed and well-nourished. No distress.  HENT:  Head: Normocephalic.  Eyes: Conjunctivae are normal. Pupils are equal, round, and reactive to light. No scleral icterus.  Neck: Normal range of motion. Neck supple. No thyromegaly present.  Cardiovascular: Normal rate and regular rhythm.  Exam reveals no gallop and no friction rub.   No murmur heard. Pulmonary/Chest: Effort normal and breath sounds normal. No respiratory distress. He has no wheezes. He has no rales.  Abdominal: Soft. Bowel sounds are normal. He exhibits no distension. There is no tenderness. There is no rebound.  Musculoskeletal: Normal range of motion.  Neurological: He is alert and oriented to person, place, and time.  Skin: Skin is warm and dry. No rash noted.  Psychiatric: He has a normal mood and affect. His behavior is normal.    ED Course  Procedures (including critical care time) DIAGNOSTIC STUDIES: Oxygen Saturation is 100% on RA, normal by my interpretation.    COORDINATION OF CARE:    Labs Review Labs Reviewed  COMPREHENSIVE METABOLIC PANEL - Abnormal; Notable for the following:    Glucose, Bld 123 (*)    GFR calc non Af Amer 74 (*)    GFR calc Af Amer 85 (*)    Anion gap 17 (*)    All other components within normal limits  URINE CULTURE  CBC WITH DIFFERENTIAL  LIPASE, BLOOD  URINALYSIS, ROUTINE W REFLEX MICROSCOPIC    Imaging Review No results found.   EKG Interpretation None      MDM   Final diagnoses:  Gastritis    On first recheck he has some continued nausea after attempting to take some by mouth. Given some Phenergan. On recheck he is feeling much better. Take liquids without difficulty. Plan is home. Stop his new medication. Protonix   I personally performed the  services described in this documentation, which was scribed in my presence. The recorded information has been reviewed and is accurate.     Tanna Furry, MD 02/09/14 2229

## 2014-02-10 LAB — URINE CULTURE
Colony Count: NO GROWTH
Culture: NO GROWTH

## 2014-03-25 ENCOUNTER — Ambulatory Visit: Payer: Medicare HMO

## 2014-03-25 ENCOUNTER — Ambulatory Visit (INDEPENDENT_AMBULATORY_CARE_PROVIDER_SITE_OTHER): Payer: Medicare HMO

## 2014-03-25 DIAGNOSIS — Z23 Encounter for immunization: Secondary | ICD-10-CM

## 2014-06-20 DIAGNOSIS — M5431 Sciatica, right side: Secondary | ICD-10-CM

## 2014-06-20 HISTORY — DX: Sciatica, right side: M54.31

## 2014-10-27 ENCOUNTER — Other Ambulatory Visit: Payer: Self-pay

## 2015-03-16 ENCOUNTER — Other Ambulatory Visit: Payer: Self-pay

## 2015-03-17 ENCOUNTER — Ambulatory Visit (INDEPENDENT_AMBULATORY_CARE_PROVIDER_SITE_OTHER): Payer: PPO

## 2015-03-17 DIAGNOSIS — Z23 Encounter for immunization: Secondary | ICD-10-CM | POA: Diagnosis not present

## 2015-03-17 NOTE — Progress Notes (Signed)
Pre visit review using our clinic review tool, if applicable. No additional management support is needed unless otherwise documented below in the visit note. 

## 2015-03-24 ENCOUNTER — Telehealth: Payer: Self-pay | Admitting: Internal Medicine

## 2015-03-24 NOTE — Telephone Encounter (Signed)
No answer, could not leave msg. Pt needs 30 min reestablish appt with Dr. Larose Kells.

## 2015-03-24 NOTE — Telephone Encounter (Signed)
Pt has not been seen by Dr. Larose Kells since 10/2011. He is considered a new patient again. If he would like pneumonia shot he will need to make a 30 minute appt with Dr. Larose Kells to re-establish.

## 2015-03-24 NOTE — Telephone Encounter (Signed)
Pt is wanting to know if he can come in for pneumonia vaccine. Came in for flu and was told it had to be approved.

## 2015-03-27 NOTE — Telephone Encounter (Signed)
Notified pt he needs to re-establish. Pt declined scheduling appt at this time. Advised Dr. Larose Kells cannot authorize pneumonia vaccine until pt is seen.

## 2015-04-15 ENCOUNTER — Other Ambulatory Visit: Payer: Self-pay

## 2015-04-17 ENCOUNTER — Encounter: Payer: Self-pay | Admitting: Internal Medicine

## 2015-04-17 ENCOUNTER — Ambulatory Visit (INDEPENDENT_AMBULATORY_CARE_PROVIDER_SITE_OTHER): Payer: PPO | Admitting: Internal Medicine

## 2015-04-17 VITALS — BP 118/74 | HR 76 | Temp 98.1°F | Resp 18 | Ht 69.0 in | Wt 212.5 lb

## 2015-04-17 DIAGNOSIS — Z23 Encounter for immunization: Secondary | ICD-10-CM | POA: Diagnosis not present

## 2015-04-17 DIAGNOSIS — Z09 Encounter for follow-up examination after completed treatment for conditions other than malignant neoplasm: Secondary | ICD-10-CM

## 2015-04-17 DIAGNOSIS — Z0000 Encounter for general adult medical examination without abnormal findings: Secondary | ICD-10-CM

## 2015-04-17 MED ORDER — ZOSTER VACCINE LIVE 19400 UNT/0.65ML ~~LOC~~ SOLR: 0.6500 mL | Freq: Once | SUBCUTANEOUS | Status: DC

## 2015-04-17 NOTE — Progress Notes (Signed)
Subjective:    Patient ID: Bradley Navarro, male    DOB: 04/22/1941, 74 y.o.   MRN: [id number]  DOS:  04/17/2015 Type of visit - description :  new patient, CPX Interval history: Since the last time I saw him more than 3 years ago he had additional back surgery, he remains symptomatic with chronic back pain, sees pain management. Quality of life has unfortunately decreased. Reports a history of right hand tremor for about a year, he has good days and bad days when the tremor is significant. Does not recall having any stroke symptoms   Review of Systems  Constitutional: No fever. No chills. No unexplained wt changes. No unusual sweats  HEENT: No dental problems, no ear discharge, no facial swelling, no voice changes. No eye discharge, no eye  redness , no  intolerance to light   Respiratory: No wheezing , no  difficulty breathing. No cough , no mucus production  Cardiovascular: No CP, no leg swelling , no  Palpitations  GI: no nausea, no vomiting, no diarrhea , no  abdominal pain.  No blood in the stools. No dysphagia, no odynophagia    Endocrine: No polyphagia, no polyuria , no polydipsia  GU: No dysuria, gross hematuria, difficulty urinating. No urinary urgency, no frequency.  Musculoskeletal: Back pain at baseline  Skin: No change in the color of the skin, palor , no  Rash  Allergic, immunologic: No environmental allergies , no  food allergies  Neurological: No dizziness no  syncope. No headaches. No diplopia, no slurred, no slurred speech, no motor deficits, no facial  Numbness  Hematological: No enlarged lymph nodes, no easy bruising , no unusual bleedings  Psychiatry: No suicidal ideas, no hallucinations, no beavior problems, no confusion.  No unusual/severe anxiety, no depression   Past Medical History  Diagnosis Date  . History of kidney stones   . History of headache     frontal lobe cluster headaches  . Chronic back pain   . Sciatica of right side 2016      S/P L5-S1 revision lam/PSF on 04-10-12 w/ relief of RLE pain post op, MRI 01/2014 shows minimal foraminal stenosis at L4-5 w/o recurrent stenosis at L5-S1    Past Surgical History  Procedure Laterality Date  . Appendectomy    . Lumbar disc surgery  2012    L-5, Dr Vennie Homans  . Lumbar fusion  2015    rod, plates  . R arm surger      Social History   Social History  . Marital Status: Married    Spouse Name: N/A  . Number of Children: 2  . Years of Education: N/A   Occupational History  . retired    Social History Main Topics  . Smoking status: Former Research scientist (life sciences)  . Smokeless tobacco: Not on file     Comment: quit in the 90, light smoker   . Alcohol Use: No  . Drug Use: No  . Sexual Activity: Not on file   Other Topics Concern  . Not on file   Social History Narrative   children 2 and 2 step daughters, has g-kids     Family History  Problem Relation Age of Onset  . Colon cancer Neg Hx   . Prostate cancer Neg Hx   . CAD Father 57    MI   . Diabetes Neg Hx        Medication List       This list is accurate as of: 04/17/15  5:13 PM.  Always use your most recent med list.               HYDROcodone-acetaminophen 10-325 MG tablet  Commonly known as:  NORCO  Take 1 tablet by mouth every 6 (six) hours as needed.     multivitamin with minerals Tabs tablet  Take 1 tablet by mouth daily.     nortriptyline 25 MG capsule  Commonly known as:  PAMELOR  Take 1 capsule by mouth 2 (two) times daily.     omeprazole 20 MG capsule  Commonly known as:  PRILOSEC  Take 1 capsule (20 mg total) by mouth 2 (two) times daily.     tizanidine 2 MG capsule  Commonly known as:  ZANAFLEX  Take 2-4 mg by mouth every 8 (eight) hours as needed. Not to exceed 3 doses in 24 hours.     zoster vaccine live (PF) 19400 UNT/0.65ML injection  Commonly known as:  ZOSTAVAX  Inject 19,400 Units into the skin once.           Objective:   Physical Exam BP 118/74 mmHg  Pulse 76   Temp(Src) 98.1 F (36.7 C) (Oral)  Resp 18  Ht 5\' 9"  (1.753 m)  Wt 212 lb 8 oz (96.389 kg)  BMI 31.37 kg/m2 General:   Well developed, well nourished . NAD.  Neck:  Full range of motion. Supple. No  Thyromegaly  HEENT:  Normocephalic . Face symmetric, atraumatic Lungs:  CTA B Normal respiratory effort, no intercostal retractions, no accessory muscle use. Heart: RRR,  no murmur.  No pretibial edema bilaterally  Abdomen:  Not distended, soft, non-tender. No rebound or rigidity. No bruit  Skin: Exposed areas without rash. Not pale. Not jaundice Neurologic:  alert & oriented X3.  Speech normal, gait assisted by a cane and limited due to back pain Strength symmetric and appropriate for age. + Intentional tremor, right upper extremity, worse distally. No immediate or tremor. Voice normal. Psych: Cognition and judgment appear intact.  Cooperative with normal attention span and concentration.  Behavior appropriate. No anxious or depressed appearing.    Assessment & Plan:   Assessment>  h/o HA-- s/p extensive w/u in the past. Has seen Dr Maureen Chatters and neuro @ [facility]   h/o kidney stones  Chronic back pain-- sees pain management  (intolerant to MS Contin), see surgeries    Plan  Chronic back pain: Per pain management Right upper extremity tremor: Essential? Advise about a neurology referral but patient is not interested, we agree he will call if something changes or the symptoms get worse RTC: One year Recommend to come and see Caryl Pina in few months for Medicare wellness exam.

## 2015-04-17 NOTE — Progress Notes (Signed)
Pre visit review using our clinic review tool, if applicable. No additional management support is needed unless otherwise documented below in the visit note. 

## 2015-04-17 NOTE — Patient Instructions (Addendum)
  Please schedule labs to be done within few days (fasting)  Please schedule a Medicare wellness exam in few months to be done by one of  our nurses Mrs. Caryl Pina   Next visit  for a    physical exam in one year. Fasting. Please schedule an appointment at the front desk

## 2015-04-17 NOTE — Assessment & Plan Note (Signed)
Td today Had a fli shot pnm 23: 2011 Prevnar today. Zostavax prescription provided Colon cancer screening: Never had a colonoscopy, after we discussed the pros and cons of screening he declined any testing as he is certain he won't accept any more invasive procedures. Prostate cancer screening:  Pros-cons discussed, many medical societies recommend against screening, patient is not interested Diet: Discussed Exercise :very limited Labs reviewed, will get a FLP, A1c, TSH.

## 2015-04-17 NOTE — Assessment & Plan Note (Signed)
Chronic back pain: Per pain management Right upper extremity tremor: Essential? Advise about a neurology referral but patient is not interested, we agree he will call if something changes or the symptoms get worse RTC: One year Recommend to come and see Caryl Pina in few months for Medicare wellness exam.

## 2015-04-20 ENCOUNTER — Ambulatory Visit (INDEPENDENT_AMBULATORY_CARE_PROVIDER_SITE_OTHER): Payer: PPO

## 2015-04-20 ENCOUNTER — Other Ambulatory Visit (INDEPENDENT_AMBULATORY_CARE_PROVIDER_SITE_OTHER): Payer: PPO

## 2015-04-20 VITALS — BP 148/90 | HR 107 | Ht 70.0 in | Wt 211.6 lb

## 2015-04-20 DIAGNOSIS — R7989 Other specified abnormal findings of blood chemistry: Secondary | ICD-10-CM

## 2015-04-20 DIAGNOSIS — Z0000 Encounter for general adult medical examination without abnormal findings: Secondary | ICD-10-CM

## 2015-04-20 LAB — TSH: TSH: 3.5 u[IU]/mL (ref 0.35–4.50)

## 2015-04-20 LAB — LIPID PANEL
Cholesterol: 249 mg/dL — ABNORMAL HIGH (ref 0–200)
HDL: 45.7 mg/dL (ref >39.00)
NonHDL: 203.59
Total CHOL/HDL Ratio: 5
Triglycerides: 238 mg/dL — ABNORMAL HIGH (ref 0.0–149.0)
VLDL: 47.6 mg/dL — ABNORMAL HIGH (ref 0.0–40.0)

## 2015-04-20 LAB — HEMOGLOBIN A1C: Hgb A1c MFr Bld: 5.7 % (ref 4.6–6.5)

## 2015-04-20 LAB — LDL CHOLESTEROL, DIRECT: Direct LDL: 184 mg/dL

## 2015-04-20 NOTE — Patient Instructions (Addendum)
Receive your Shingles vaccine 1 month from now.    Continue to eat a healthy diet and remain active as possible.  Follow up with Dr. Larose Kells as scheduled.   Scheduled Medicare Wellness and CPE for next year.     Fall Prevention in the Home  Falls can cause injuries. They can happen to people of all ages. There are many things you can do to make your home safe and to help prevent falls.  WHAT CAN I DO ON THE OUTSIDE OF MY HOME?  Regularly fix the edges of walkways and driveways and fix any cracks.  Remove anything that might make you trip as you walk through a door, such as a raised step or threshold.  Trim any bushes or trees on the path to your home.  Use bright outdoor lighting.  Clear any walking paths of anything that might make someone trip, such as rocks or tools.  Regularly check to see if handrails are loose or broken. Make sure that both sides of any steps have handrails.  Any raised decks and porches should have guardrails on the edges.  Have any leaves, snow, or ice cleared regularly.  Use sand or salt on walking paths during winter.  Clean up any spills in your garage right away. This includes oil or grease spills. WHAT CAN I DO IN THE BATHROOM?   Use night lights.  Install grab bars by the toilet and in the tub and shower. Do not use towel bars as grab bars.  Use non-skid mats or decals in the tub or shower.  If you need to sit down in the shower, use a plastic, non-slip stool.  Keep the floor dry. Clean up any water that spills on the floor as soon as it happens.  Remove soap buildup in the tub or shower regularly.  Attach bath mats securely with double-sided non-slip rug tape.  Do not have throw rugs and other things on the floor that can make you trip. WHAT CAN I DO IN THE BEDROOM?  Use night lights.  Make sure that you have a light by your bed that is easy to reach.  Do not use any sheets or blankets that are too big for your bed. They should not  hang down onto the floor.  Have a firm chair that has side arms. You can use this for support while you get dressed.  Do not have throw rugs and other things on the floor that can make you trip. WHAT CAN I DO IN THE KITCHEN?  Clean up any spills right away.  Avoid walking on wet floors.  Keep items that you use a lot in easy-to-reach places.  If you need to reach something above you, use a strong step stool that has a grab bar.  Keep electrical cords out of the way.  Do not use floor polish or wax that makes floors slippery. If you must use wax, use non-skid floor wax.  Do not have throw rugs and other things on the floor that can make you trip. WHAT CAN I DO WITH MY STAIRS?  Do not leave any items on the stairs.  Make sure that there are handrails on both sides of the stairs and use them. Fix handrails that are broken or loose. Make sure that handrails are as long as the stairways.  Check any carpeting to make sure that it is firmly attached to the stairs. Fix any carpet that is loose or worn.  Avoid having throw  rugs at the top or bottom of the stairs. If you do have throw rugs, attach them to the floor with carpet tape.  Make sure that you have a light switch at the top of the stairs and the bottom of the stairs. If you do not have them, ask someone to add them for you. WHAT ELSE CAN I DO TO HELP PREVENT FALLS?  Wear shoes that:  Do not have high heels.  Have rubber bottoms.  Are comfortable and fit you well.  Are closed at the toe. Do not wear sandals.  If you use a stepladder:  Make sure that it is fully opened. Do not climb a closed stepladder.  Make sure that both sides of the stepladder are locked into place.  Ask someone to hold it for you, if possible.  Clearly mark and make sure that you can see:  Any grab bars or handrails.  First and last steps.  Where the edge of each step is.  Use tools that help you move around (mobility aids) if they are  needed. These include:  Canes.  Walkers.  Scooters.  Crutches.  Turn on the lights when you go into a dark area. Replace any light bulbs as soon as they burn out.  Set up your furniture so you have a clear path. Avoid moving your furniture around.  If any of your floors are uneven, fix them.  If there are any pets around you, be aware of where they are.  Review your medicines with your doctor. Some medicines can make you feel dizzy. This can increase your chance of falling. Ask your doctor what other things that you can do to help prevent falls.   This information is not intended to replace advice given to you by your health care provider. Make sure you discuss any questions you have with your health care provider.   Document Released: 04/02/2009 Document Revised: 10/21/2014 Document Reviewed: 07/11/2014 Elsevier Interactive Patient Education 2016 [employer].  Hearing Loss Hearing loss is a partial or total loss of the ability to hear. This can be temporary or permanent, and it can happen in one or both ears. Hearing loss may be referred to as deafness. Medical care is necessary to treat hearing loss properly and to prevent the condition from getting worse. Your hearing may partially or completely come back, depending on what caused your hearing loss and how severe it is. In some cases, hearing loss is permanent. CAUSES Common causes of hearing loss include:   Too much wax in the ear canal.   Infection of the ear canal or middle ear.   Fluid in the middle ear.   Injury to the ear or surrounding area.   An object stuck in the ear.   Prolonged exposure to loud sounds, such as music.  Less common causes of hearing loss include:   Tumors in the ear.   Viral or bacterial infections, such as meningitis.   A hole in the eardrum (perforated eardrum).  Problems with the hearing nerve that sends signals between the brain and the ear.  Certain medicines.   SYMPTOMS  Symptoms of this condition may include:  Difficulty telling the difference between sounds.  Difficulty following a conversation when there is background noise.  Lack of response to sounds in your environment. This may be most noticeable when you do not respond to startling sounds.  Needing to turn up the volume on the television, radio, etc.  Ringing in the ears.  Dizziness.  Pain in the ears. DIAGNOSIS This condition is diagnosed based on a physical exam and a hearing test (audiometry). The audiometry test will be performed by a hearing specialist (audiologist). You may also be referred to an ear, nose, and throat (ENT) specialist (otolaryngologist).  TREATMENT Treatment for recent onset of hearing loss may include:   Ear wax removal.   Being prescribed medicines to prevent infection (antibiotics).   Being prescribed medicines to reduce inflammation (corticosteroids).  HOME CARE INSTRUCTIONS  If you were prescribed an antibiotic medicine, take it as told by your health care provider. Do not stop taking the antibiotic even if you start to feel better.  Take over-the-counter and prescription medicines only as told by your health care provider.  Avoid loud noises.   Return to your normal activities as told by your health care provider. Ask your health care provider what activities are safe for you.  Keep all follow-up visits as told by your health care provider. This is important. SEEK MEDICAL CARE IF:   You feel dizzy.   You develop new symptoms.   You vomit or feel nauseous.   You have a fever.  SEEK IMMEDIATE MEDICAL CARE IF:  You develop sudden changes in your vision.   You have severe ear pain.   You have new or increased weakness.  You have a severe headache.   This information is not intended to replace advice given to you by your health care provider. Make sure you discuss any questions you have with your health care provider.    Document Released: 06/06/2005 Document Revised: 02/25/2015 Document Reviewed: 10/22/2014 Elsevier Interactive Patient Education [employer].

## 2015-04-20 NOTE — Progress Notes (Addendum)
Subjective:   Bradley Navarro is a 74 y.o. male who presents for an Initial Medicare Annual Wellness Visit.  States he's in overall good health with the exception of chronic back pain.    Review of Systems Cardiac Risk Factors include: advanced age (>52men, >59 women);male gender    Sleep patterns:   Sleeps 8-9 hours per night/wakes up 3-4 times per night due to being a light sleeper.  Wakes up at least once to void.   Home Safety/Smoke Alarms:  Feels safe at home.  Lives with wife in 2 story condo.  Smoke detectors and alarm system present.  Firearm Safety: No firearms.  Seat Belt Safety/Bike Helmet:  Always wears seat belt.    Counseling:   Eye Exam- 2002, no changes in vision noted.  Dental- Not often, due to cost.   Male:  CCS-never had one.  Discussed need for CCS with Dr. Larose Kells.  Pt continues to decline.  No family hx.     PSA-  05/24/10-normal  Objective:    Today's Vitals   04/20/15 0822 04/20/15 0859  BP: 158/90 148/90  Pulse: 107   Height: 5\' 10"  (1.778 m)   Weight: 211 lb 9.6 oz (95.981 kg)   SpO2: 97%   PainSc: 7    PainLoc: Back     Current Medications (verified) Outpatient Encounter Prescriptions as of 04/20/2015  Medication Sig  . HYDROcodone-acetaminophen (NORCO) 10-325 MG per tablet Take 1 tablet by mouth every 6 (six) hours as needed.  . Multiple Vitamin (MULITIVITAMIN WITH MINERALS) TABS Take 1 tablet by mouth daily.  . nortriptyline (PAMELOR) 25 MG capsule Take 1 capsule by mouth 2 (two) times daily.  Marland Kitchen omeprazole (PRILOSEC) 20 MG capsule Take 1 capsule (20 mg total) by mouth 2 (two) times daily.  . tizanidine (ZANAFLEX) 2 MG capsule Take 2-4 mg by mouth every 8 (eight) hours as needed. Not to exceed 3 doses in 24 hours.  Marland Kitchen zoster vaccine live, PF, (ZOSTAVAX) 95320 UNT/0.65ML injection Inject 19,400 Units into the skin once. (Patient not taking: Reported on 04/20/2015)   No facility-administered encounter medications on file as of 04/20/2015.     Allergies (verified) Penicillins   History: Past Medical History  Diagnosis Date  . History of kidney stones   . History of headache     frontal lobe cluster headaches  . Chronic back pain   . Sciatica of right side 2016    S/P L5-S1 revision lam/PSF on 04-10-12 w/ relief of RLE pain post op, MRI 01/2014 shows minimal foraminal stenosis at L4-5 w/o recurrent stenosis at L5-S1   Past Surgical History  Procedure Laterality Date  . Appendectomy    . Lumbar disc surgery  2012    L-5, Dr Vennie Homans  . Lumbar fusion  2015    rod, plates  . R arm surger     Family History  Problem Relation Age of Onset  . Colon cancer Neg Hx   . Prostate cancer Neg Hx   . CAD Father 28    MI   . Diabetes Neg Hx    Social History   Occupational History  . retired    Social History Main Topics  . Smoking status: Former Research scientist (life sciences)  . Smokeless tobacco: Not on file     Comment: quit in the 90, light smoker   . Alcohol Use: No  . Drug Use: No  . Sexual Activity: Not on file   Tobacco Counseling Counseling given: Yes   Activities  of Daily Living In your present state of health, do you have any difficulty performing the following activities: 04/20/2015 04/17/2015  Hearing? Y N  Vision? N N  Difficulty concentrating or making decisions? N N  Walking or climbing stairs? Y Y  Dressing or bathing? N N  Doing errands, shopping? N N  Preparing Food and eating ? N -  Using the Toilet? N -  In the past six months, have you accidently leaked urine? N -  Do you have problems with loss of bowel control? N -  Managing your Medications? N -  Managing your Finances? N -  Housekeeping or managing your Housekeeping? Y -    Immunizations and Health Maintenance Immunization History  Administered Date(s) Administered  . Influenza Split 03/30/2011, 03/28/2012  . Influenza Whole 03/02/2010  . Influenza, High Dose Seasonal PF 03/17/2015  . Influenza,inj,Quad PF,36+ Mos 03/20/2013, 03/25/2014  .  Pneumococcal Conjugate-13 04/17/2015  . Pneumococcal Polysaccharide-23 03/02/2010  . Td 04/17/2015   There are no preventive care reminders to display for this patient.  Patient Care Team: Colon Branch, MD as PCP - [place], [place] as Physician Assistant (Orthopedic Surgery) Zonia Kief, MD as Consulting Physician (Rehabilitation)  Indicate any recent Medical Services you may have received from other than [facility] providers in the past year (date may be approximate).    Assessment:   This is a routine wellness examination for Bradley Navarro.   Hearing/Vision screen  Hearing Screening   125Hz  250Hz  500Hz  1000Hz  2000Hz  4000Hz  8000Hz   Right ear:   Fail Fail Fail Fail   Left ear:   Fail Fail Fail Fail   Comments: Has noticed some changes. Unable to hear with background noise.    Vision Screening Comments: No changes in vision. Last eye exam:  2002   Dietary issues and exercise activities discussed: Current Exercise Habits:: The patient does not participate in regular exercise at present (Unable to exercise on a regular basis due to back )  Diet: Eats 2 meals per day.  Light Dinner.  Most foods are baked or grilled.  Eats fruits and vegetable every day.  3 bottles of water and 1-2 sodas per day.    Goals    . Continue eating healthy and remain active.       Depression Screen PHQ 2/9 Scores 04/20/2015 04/17/2015  PHQ - 2 Score 0 0    Fall Risk Fall Risk  04/20/2015 04/17/2015  Falls in the past year? Yes No  Number falls in past yr: 2 or more -  Injury with Fall? No -  Risk Factor Category  High Fall Risk -  Risk for fall due to : Impaired balance/gait;Impaired mobility -  Risk for fall due to (comments): Walks with a cane -  Follow up Education provided;Falls prevention discussed -    Cognitive Function: MMSE - Mini Mental State Exam 04/20/2015  Orientation to time 5  Orientation to Place 5  Registration 3  Attention/ Calculation 5  Recall 3  Language- name 2  objects 2  Language- repeat 1  Language- follow 3 step command 3  Language- read & follow direction 1  Write a sentence 1  Copy design 1  Total score 30    Screening Tests Health Maintenance  Topic Date Due  . COLONOSCOPY  06/21/2015 (Originally 02/22/1992)  . ZOSTAVAX  04/16/2016 (Originally 02/21/2002)  . INFLUENZA VACCINE  01/19/2016  . TETANUS/TDAP  04/16/2025  . PNA vac Low Risk Adult  Completed  Plan:  Receive your Shingles vaccine 1 month from now.    Continue to eat a healthy diet and remain active as possible.  Follow up with Dr. Larose Kells as scheduled.   Scheduled Medicare Wellness and CPE for next year.     During the course of the visit Kaspian was educated and counseled about the following appropriate screening and preventive services:   Vaccines to include Pneumoccal, Influenza, Hepatitis B, Td, Zostavax, HCV  Electrocardiogram  Colorectal cancer screening  Cardiovascular disease screening  Diabetes screening  Glaucoma screening  Nutrition counseling  Prostate cancer screening  Smoking cessation counseling  Patient Instructions (the written plan) were given to the patient.   Rudene Anda, RN   04/20/2015   Agree, French Ana MD

## 2015-04-20 NOTE — Progress Notes (Signed)
Pre visit review using our clinic review tool, if applicable. No additional management support is needed unless otherwise documented below in the visit note. 

## 2015-07-17 DIAGNOSIS — M545 Low back pain: Secondary | ICD-10-CM | POA: Diagnosis not present

## 2015-07-17 DIAGNOSIS — M5431 Sciatica, right side: Secondary | ICD-10-CM | POA: Diagnosis not present

## 2015-09-18 DIAGNOSIS — Z79899 Other long term (current) drug therapy: Secondary | ICD-10-CM | POA: Diagnosis not present

## 2015-09-18 DIAGNOSIS — M5431 Sciatica, right side: Secondary | ICD-10-CM | POA: Diagnosis not present

## 2015-09-18 DIAGNOSIS — M545 Low back pain: Secondary | ICD-10-CM | POA: Diagnosis not present

## 2015-09-18 DIAGNOSIS — Z79891 Long term (current) use of opiate analgesic: Secondary | ICD-10-CM | POA: Diagnosis not present

## 2015-09-18 DIAGNOSIS — M4716 Other spondylosis with myelopathy, lumbar region: Secondary | ICD-10-CM | POA: Diagnosis not present

## 2015-09-18 DIAGNOSIS — G894 Chronic pain syndrome: Secondary | ICD-10-CM | POA: Diagnosis not present

## 2015-11-13 DIAGNOSIS — M4716 Other spondylosis with myelopathy, lumbar region: Secondary | ICD-10-CM | POA: Diagnosis not present

## 2015-11-13 DIAGNOSIS — M545 Low back pain: Secondary | ICD-10-CM | POA: Diagnosis not present

## 2015-11-13 DIAGNOSIS — M5431 Sciatica, right side: Secondary | ICD-10-CM | POA: Diagnosis not present

## 2016-01-08 DIAGNOSIS — M961 Postlaminectomy syndrome, not elsewhere classified: Secondary | ICD-10-CM | POA: Diagnosis not present

## 2016-01-08 DIAGNOSIS — M5431 Sciatica, right side: Secondary | ICD-10-CM | POA: Diagnosis not present

## 2016-01-08 DIAGNOSIS — M545 Low back pain: Secondary | ICD-10-CM | POA: Diagnosis not present

## 2016-01-12 ENCOUNTER — Other Ambulatory Visit: Payer: Self-pay | Admitting: Rehabilitation

## 2016-01-12 DIAGNOSIS — M5431 Sciatica, right side: Secondary | ICD-10-CM

## 2016-01-12 DIAGNOSIS — M5432 Sciatica, left side: Principal | ICD-10-CM

## 2016-01-22 ENCOUNTER — Ambulatory Visit
Admission: RE | Admit: 2016-01-22 | Discharge: 2016-01-22 | Disposition: A | Discharge status: Home / Self Care | Payer: PPO | Source: Ambulatory Visit | Attending: Rehabilitation | Admitting: Rehabilitation

## 2016-01-22 DIAGNOSIS — M5126 Other intervertebral disc displacement, lumbar region: Secondary | ICD-10-CM | POA: Diagnosis not present

## 2016-01-22 DIAGNOSIS — M5432 Sciatica, left side: Principal | ICD-10-CM

## 2016-01-22 DIAGNOSIS — M5431 Sciatica, right side: Secondary | ICD-10-CM

## 2016-01-22 MED ORDER — GADOBENATE DIMEGLUMINE 529 MG/ML IV SOLN
20.0000 mL | Freq: Once | INTRAVENOUS | Status: AC | PRN
  Administered 2016-01-22: 20 mL via INTRAVENOUS

## 2016-02-05 DIAGNOSIS — M545 Low back pain: Secondary | ICD-10-CM | POA: Diagnosis not present

## 2016-02-05 DIAGNOSIS — M961 Postlaminectomy syndrome, not elsewhere classified: Secondary | ICD-10-CM | POA: Diagnosis not present

## 2016-02-05 DIAGNOSIS — M5431 Sciatica, right side: Secondary | ICD-10-CM | POA: Diagnosis not present

## 2016-03-18 DIAGNOSIS — M545 Low back pain: Secondary | ICD-10-CM | POA: Diagnosis not present

## 2016-03-18 DIAGNOSIS — Z79891 Long term (current) use of opiate analgesic: Secondary | ICD-10-CM | POA: Diagnosis not present

## 2016-03-18 DIAGNOSIS — Z79899 Other long term (current) drug therapy: Secondary | ICD-10-CM | POA: Diagnosis not present

## 2016-03-18 DIAGNOSIS — M5431 Sciatica, right side: Secondary | ICD-10-CM | POA: Diagnosis not present

## 2016-03-18 DIAGNOSIS — M961 Postlaminectomy syndrome, not elsewhere classified: Secondary | ICD-10-CM | POA: Diagnosis not present

## 2016-03-18 DIAGNOSIS — G894 Chronic pain syndrome: Secondary | ICD-10-CM | POA: Diagnosis not present

## 2016-04-19 ENCOUNTER — Ambulatory Visit (INDEPENDENT_AMBULATORY_CARE_PROVIDER_SITE_OTHER): Payer: PPO | Admitting: Internal Medicine

## 2016-04-19 ENCOUNTER — Encounter: Payer: Self-pay | Admitting: Internal Medicine

## 2016-04-19 VITALS — BP 132/88 | HR 104 | Temp 98.1°F | Ht 70.0 in | Wt 216.6 lb

## 2016-04-19 DIAGNOSIS — Z23 Encounter for immunization: Secondary | ICD-10-CM | POA: Diagnosis not present

## 2016-04-19 DIAGNOSIS — R739 Hyperglycemia, unspecified: Secondary | ICD-10-CM | POA: Diagnosis not present

## 2016-04-19 DIAGNOSIS — Z0000 Encounter for general adult medical examination without abnormal findings: Secondary | ICD-10-CM

## 2016-04-19 DIAGNOSIS — L570 Actinic keratosis: Secondary | ICD-10-CM

## 2016-04-19 LAB — COMPREHENSIVE METABOLIC PANEL
ALT: 20 U/L (ref 0–53)
AST: 20 U/L (ref 0–37)
Albumin: 4.8 g/dL (ref 3.5–5.2)
Alkaline Phosphatase: 68 U/L (ref 39–117)
BUN: 16 mg/dL (ref 6–23)
CO2: 30 mEq/L (ref 19–32)
Calcium: 9.9 mg/dL (ref 8.4–10.5)
Chloride: 100 mEq/L (ref 96–112)
Creatinine, Ser: 0.88 mg/dL (ref 0.40–1.50)
GFR: 89.94 mL/min (ref >60.00)
Glucose, Bld: 85 mg/dL (ref 70–99)
Potassium: 3.9 mEq/L (ref 3.5–5.1)
Sodium: 139 mEq/L (ref 135–145)
Total Bilirubin: 0.9 mg/dL (ref 0.2–1.2)
Total Protein: 7.5 g/dL (ref 6.0–8.3)

## 2016-04-19 LAB — CBC WITH DIFFERENTIAL/PLATELET
Basophils Absolute: 0 10*3/uL (ref 0.0–0.1)
Basophils Relative: 0.3 % (ref 0.0–3.0)
Eosinophils Absolute: 0.1 10*3/uL (ref 0.0–0.7)
Eosinophils Relative: 1.5 % (ref 0.0–5.0)
HCT: 43.8 % (ref 39.0–52.0)
Hemoglobin: 15.1 g/dL (ref 13.0–17.0)
Lymphocytes Relative: 41.2 % (ref 12.0–46.0)
Lymphs Abs: 3 10*3/uL (ref 0.7–4.0)
MCHC: 34.4 g/dL (ref 30.0–36.0)
MCV: 93.5 fl (ref 78.0–100.0)
Monocytes Absolute: 0.5 10*3/uL (ref 0.1–1.0)
Monocytes Relative: 6.8 % (ref 3.0–12.0)
Neutro Abs: 3.6 10*3/uL (ref 1.4–7.7)
Neutrophils Relative %: 50.2 % (ref 43.0–77.0)
Platelets: 258 10*3/uL (ref 150.0–400.0)
RBC: 4.69 Mil/uL (ref 4.22–5.81)
RDW: 12.8 % (ref 11.5–15.5)
WBC: 7.2 10*3/uL (ref 4.0–10.5)

## 2016-04-19 LAB — LDL CHOLESTEROL, DIRECT: Direct LDL: 133 mg/dL

## 2016-04-19 LAB — HEMOGLOBIN A1C: Hgb A1c MFr Bld: 5.7 % (ref 4.6–6.5)

## 2016-04-19 LAB — LIPID PANEL
Cholesterol: 236 mg/dL — ABNORMAL HIGH (ref 0–200)
HDL: 33 mg/dL — ABNORMAL LOW (ref >39.00)
Total CHOL/HDL Ratio: 7
Triglycerides: 451 mg/dL — ABNORMAL HIGH (ref 0.0–149.0)

## 2016-04-19 MED ORDER — ZOSTER VACCINE LIVE 19400 UNT/0.65ML ~~LOC~~ SUSR: 0.6500 mL | Freq: Once | SUBCUTANEOUS | 0 refills | Status: AC

## 2016-04-19 NOTE — Assessment & Plan Note (Signed)
Td 2016; pnm 23: 2011; Prevnar 2016, flu shot today Zostavax prescription provided again today  Colon cancer screening: Never had a colonoscopy, not interested  Prostate cancer screening: see previous entry, not interested Diet: Discussed Exercise :very limited d/t back pain Labs reviewed: We'll get a CMP, FLP, CBC, A1c Also discussed: Healthcare power of attorney, fall prevention.

## 2016-04-19 NOTE — Progress Notes (Signed)
Subjective:    Patient ID: Bradley Navarro, male    DOB: September 23, 1940, 75 y.o.   MRN: [id number]  DOS:  04/19/2016 Type of visit - description : CPX Interval history:In general feeling well, back pain is still issue    Review of Systems  Constitutional: No fever. No chills. No unexplained wt changes. Has sweats at night, this is going on for years without other associated symptoms  HEENT: No dental problems, no ear discharge, no facial swelling, no voice changes. No eye discharge, no eye  redness , no  intolerance to light   Respiratory: No wheezing , no  difficulty breathing. No cough , no mucus production  Cardiovascular: No CP, no leg swelling , no  Palpitations  GI: no nausea, no vomiting, no diarrhea , no  abdominal pain.  No blood in the stools. No dysphagia, no odynophagia    Endocrine: No polyphagia, no polyuria , no polydipsia  GU: No dysuria, gross hematuria, difficulty urinating. No urinary urgency, no frequency.  Musculoskeletal: No joint swellings or unusual aches or pains  Skin: No change in the color of the skin, palor , no  Rash  Allergic, immunologic: No environmental allergies , no  food allergies  Neurological: No dizziness no  syncope. No headaches. No diplopia, no slurred, no slurred speech, no motor deficits, no facial  Numbness  Hematological: No enlarged lymph nodes, no easy bruising , no unusual bleedings  Psychiatry: No suicidal ideas, no hallucinations, no beavior problems, no confusion.  No unusual/severe anxiety, no depression   Past Medical History:  Diagnosis Date  . Chronic back pain   . History of headache    frontal lobe cluster headaches  . History of kidney stones   . Post laminectomy syndrome   . Sciatica of right side 2016   S/P L5-S1 revision lam/PSF on 04-10-12 w/ relief of RLE pain post op, MRI 01/2014 shows minimal foraminal stenosis at L4-5 w/o recurrent stenosis at L5-S1    Past Surgical History:  Procedure Laterality  Date  . APPENDECTOMY    . LUMBAR DISC SURGERY  2012   L-5, Dr Vennie Homans  . LUMBAR FUSION  2015   rod, plates  . r arm surger      Social History   Social History  . Marital status: Married    Spouse name: N/A  . Number of children: 2  . Years of education: N/A   Occupational History  . retired    Social History Main Topics  . Smoking status: Former Research scientist (life sciences)  . Smokeless tobacco: Never Used     Comment: quit in the 90, light smoker   . Alcohol use No  . Drug use: No  . Sexual activity: Not on file   Other Topics Concern  . Not on file   Social History Narrative   children 2 and 2 step daughters, has g-kids     Family History  Problem Relation Age of Onset  . CAD Father 73    MI   . Colon cancer Neg Hx   . Prostate cancer Neg Hx   . Diabetes Neg Hx    Family History  Problem Relation Age of Onset  . CAD Father 82    MI   . Colon cancer Neg Hx   . Prostate cancer Neg Hx   . Diabetes Neg Hx        Medication List       Accurate as of 04/19/16 11:59 PM. Always use your  most recent med list.          HYDROcodone-acetaminophen 10-325 MG tablet Commonly known as:  NORCO Take 1 tablet by mouth every 6 (six) hours as needed.   morphine 30 MG 12 hr tablet Commonly known as:  MS CONTIN Take 30 mg by mouth 3 (three) times daily.   multivitamin with minerals Tabs tablet Take 1 tablet by mouth daily.   nortriptyline 25 MG capsule Commonly known as:  PAMELOR Take 1 capsule by mouth 2 (two) times daily.   omeprazole 20 MG capsule Commonly known as:  PRILOSEC Take 1 capsule (20 mg total) by mouth 2 (two) times daily.   tizanidine 2 MG capsule Commonly known as:  ZANAFLEX Take 2-4 mg by mouth every 8 (eight) hours as needed. Not to exceed 3 doses in 24 hours.   Zoster Vaccine Live (PF) 19400 UNT/0.65ML injection Commonly known as:  ZOSTAVAX Inject 19,400 Units into the skin once.          Objective:   Physical Exam BP 132/88 (BP Location: Left  Arm, Patient Position: Sitting, Cuff Size: Large)   Pulse (!) 104   Temp 98.1 F (36.7 C) (Oral)   Ht 5\' 10"  (1.778 m)   Wt 216 lb 9.6 oz (98.2 kg)   SpO2 96%   BMI 31.08 kg/m  General:   Well developed, well nourished . NAD.  Neck: No  thyromegaly  HEENT:  Normocephalic . Face symmetric, atraumatic Lungs:  CTA B Normal respiratory effort, no intercostal retractions, no accessory muscle use. Heart: RRR,  no murmur.  No pretibial edema bilaterally  Abdomen:  Not distended, soft, non-tender. No rebound or rigidity.   Skin: Scalp with multiple areas of scaliness, very mild redness. Neurological: alert, Oriented 3, gait   unassisted but very limited by pain.  Strength symmetric and appropriate for age.  Psych: Cognition and judgment appear intact.  Cooperative with normal attention span and concentration.  Behavior appropriate. No anxious or depressed appearing.    Assessment & Plan:    Assessment>  High cholesterol h/o HA-- s/p extensive w/u in the past. Has seen Dr Maureen Chatters and neuro @ [facility]   h/o kidney stones  Chronic back pain-- sees pain management  (intolerant to MS Contin), see surgeries   PLAN High cholesterol: Diet control, check a FLP. We talk about possibly taking medication, he is not completely sure he would like to go that way. Chronic back pain: per Pain management Tachycardia--HR slt elevated, reports is normal for him. Last A1c slightly elevated, recheck today. RTC one year.

## 2016-04-19 NOTE — Progress Notes (Signed)
Pre visit review using our clinic review tool, if applicable. No additional management support is needed unless otherwise documented below in the visit note. 

## 2016-04-19 NOTE — Patient Instructions (Signed)
Get your blood work before you leave    Think about a healthcare power of attorney  Next visit in one year     Fall Prevention and [place] cause injuries and can affect all age groups. It is possible to use preventive measures to significantly decrease the likelihood of falls. There are many simple measures which can make your home safer and prevent falls. OUTDOORS  Repair cracks and edges of walkways and driveways.  Remove high doorway thresholds.  Trim shrubbery on the main path into your home.  Have good outside lighting.  Clear walkways of tools, rocks, debris, and clutter.  Check that handrails are not broken and are securely fastened. Both sides of steps should have handrails.  Have leaves, snow, and ice cleared regularly.  Use sand or salt on walkways during winter months.  In the garage, clean up grease or oil spills. BATHROOM  Install night lights.  Install grab bars by the toilet and in the tub and shower.  Use non-skid mats or decals in the tub or shower.  Place a plastic non-slip stool in the shower to sit on, if needed.  Keep floors dry and clean up all water on the floor immediately.  Remove soap buildup in the tub or shower on a regular basis.  Secure bath mats with non-slip, double-sided rug tape.  Remove throw rugs and tripping hazards from the floors. BEDROOMS  Install night lights.  Make sure a bedside light is easy to reach.  Do not use oversized bedding.  Keep a telephone by your bedside.  Have a firm chair with side arms to use for getting dressed.  Remove throw rugs and tripping hazards from the floor. KITCHEN  Keep handles on pots and pans turned toward the center of the stove. Use back burners when possible.  Clean up spills quickly and allow time for drying.  Avoid walking on wet floors.  Avoid hot utensils and knives.  Position shelves so they are not too high or low.  Place commonly used objects within easy  reach.  If necessary, use a sturdy step stool with a grab bar when reaching.  Keep electrical cables out of the way.  Do not use floor polish or wax that makes floors slippery. If you must use wax, use non-skid floor wax.  Remove throw rugs and tripping hazards from the floor. STAIRWAYS  Never leave objects on stairs.  Place handrails on both sides of stairways and use them. Fix any loose handrails. Make sure handrails on both sides of the stairways are as long as the stairs.  Check carpeting to make sure it is firmly attached along stairs. Make repairs to worn or loose carpet promptly.  Avoid placing throw rugs at the top or bottom of stairways, or properly secure the rug with carpet tape to prevent slippage. Get rid of throw rugs, if possible.  Have an electrician put in a light switch at the top and bottom of the stairs. OTHER FALL PREVENTION TIPS  Wear low-heel or rubber-soled shoes that are supportive and fit well. Wear closed toe shoes.  When using a stepladder, make sure it is fully opened and both spreaders are firmly locked. Do not climb a closed stepladder.  Add color or contrast paint or tape to grab bars and handrails in your home. Place contrasting color strips on first and last steps.  Learn and use mobility aids as needed. Install an electrical emergency response system.  Turn on lights to avoid  dark areas. Replace light bulbs that burn out immediately. Get light switches that glow.  Arrange furniture to create clear pathways. Keep furniture in the same place.  Firmly attach carpet with non-skid or double-sided tape.  Eliminate uneven floor surfaces.  Select a carpet pattern that does not visually hide the edge of steps.  Be aware of all pets. OTHER HOME SAFETY TIPS  Set the water temperature for 120 F (48.8 C).  Keep emergency numbers on or near the telephone.  Keep smoke detectors on every level of the home and near sleeping areas. Document Released:  05/27/2002 Document Revised: 12/06/2011 Document Reviewed: 08/26/2011 [facility] Patient Information 2015 [place], [place]. This information is not intended to replace advice given to you by your health care provider. Make sure you discuss any questions you have with your health care provider.   Preventive Care for Adults Ages 39 and over  Blood pressure check.** / Every 1 to 2 years.  Lipid and cholesterol check.**/ Every 5 years beginning at age 78.  Lung cancer screening. / Every year if you are aged 56-80 years and have a 30-pack-year history of smoking and currently smoke or have quit within the past 15 years. Yearly screening is stopped once you have quit smoking for at least 15 years or develop a health problem that would prevent you from having lung cancer treatment.  Fecal occult blood test (FOBT) of stool. / Every year beginning at age 55 and continuing until age 99. You may not have to do this test if you get a colonoscopy every 10 years.  Flexible sigmoidoscopy** or colonoscopy.** / Every 5 years for a flexible sigmoidoscopy or every 10 years for a colonoscopy beginning at age 53 and continuing until age 19.  Hepatitis C blood test.** / For all people born from [birth year] through 1965 and any individual with known risks for hepatitis C.  Abdominal aortic aneurysm (AAA) screening.** / A one-time screening for ages 43 to 9 years who are current or former smokers.  Skin self-exam. / Monthly.  Influenza vaccine. / Every year.  Tetanus, diphtheria, and acellular pertussis (Tdap/Td) vaccine.** / 1 dose of Td every 10 years.  Varicella vaccine.** / Consult your health care provider.  Zoster vaccine.** / 1 dose for adults aged 71 years or older.  Pneumococcal 13-valent conjugate (PCV13) vaccine.** / Consult your health care provider.  Pneumococcal polysaccharide (PPSV23) vaccine.** / 1 dose for all adults aged 59 years and older.  Meningococcal vaccine.** / Consult your health care  provider.  Hepatitis A vaccine.** / Consult your health care provider.  Hepatitis B vaccine.** / Consult your health care provider.  Haemophilus influenzae type b (Hib) vaccine.** / Consult your health care provider. **Family history and personal history of risk and conditions may change your health care provider's recommendations. Document Released: 08/02/2001 Document Revised: 06/11/2013 Document Reviewed: 11/01/2010 [facility] Patient Information 2015 [place], [place]. This information is not intended to replace advice given to you by your health care provider. Make sure you discuss any questions you have with your health care provider.

## 2016-04-21 NOTE — Assessment & Plan Note (Signed)
High cholesterol: Diet control, check a FLP. We talk about possibly taking medication, he is not completely sure he would like to go that way. Chronic back pain: per Pain management Tachycardia--HR slt elevated, reports is normal for him. Last A1c slightly elevated, recheck today. RTC one year.

## 2016-05-23 DIAGNOSIS — Z23 Encounter for immunization: Secondary | ICD-10-CM | POA: Diagnosis not present

## 2016-05-23 DIAGNOSIS — L570 Actinic keratosis: Secondary | ICD-10-CM | POA: Diagnosis not present

## 2016-06-03 DIAGNOSIS — M545 Low back pain: Secondary | ICD-10-CM | POA: Diagnosis not present

## 2016-06-03 DIAGNOSIS — M4716 Other spondylosis with myelopathy, lumbar region: Secondary | ICD-10-CM | POA: Diagnosis not present

## 2016-06-03 DIAGNOSIS — M5431 Sciatica, right side: Secondary | ICD-10-CM | POA: Diagnosis not present

## 2016-08-05 DIAGNOSIS — M5431 Sciatica, right side: Secondary | ICD-10-CM | POA: Diagnosis not present

## 2016-08-05 DIAGNOSIS — M545 Low back pain: Secondary | ICD-10-CM | POA: Diagnosis not present

## 2016-08-05 DIAGNOSIS — M961 Postlaminectomy syndrome, not elsewhere classified: Secondary | ICD-10-CM | POA: Diagnosis not present

## 2016-08-19 ENCOUNTER — Encounter: Payer: Self-pay | Admitting: Family Medicine

## 2016-08-19 ENCOUNTER — Other Ambulatory Visit (HOSPITAL_COMMUNITY)
Admission: RE | Admit: 2016-08-19 | Discharge: 2016-08-19 | Disposition: A | Discharge status: Home / Self Care | Payer: PPO | Source: Ambulatory Visit | Attending: Family Medicine | Admitting: Family Medicine

## 2016-08-19 ENCOUNTER — Ambulatory Visit (INDEPENDENT_AMBULATORY_CARE_PROVIDER_SITE_OTHER): Payer: PPO | Admitting: Family Medicine

## 2016-08-19 VITALS — BP 112/70 | HR 108 | Temp 97.5°F | Ht 70.0 in | Wt 218.4 lb

## 2016-08-19 DIAGNOSIS — R350 Frequency of micturition: Secondary | ICD-10-CM

## 2016-08-19 DIAGNOSIS — N3000 Acute cystitis without hematuria: Secondary | ICD-10-CM | POA: Diagnosis not present

## 2016-08-19 DIAGNOSIS — Z113 Encounter for screening for infections with a predominantly sexual mode of transmission: Secondary | ICD-10-CM | POA: Insufficient documentation

## 2016-08-19 LAB — POC URINALSYSI DIPSTICK (AUTOMATED)
Bilirubin, UA: NEGATIVE
Blood, UA: NEGATIVE
Glucose, UA: NEGATIVE
Ketones, UA: NEGATIVE
Nitrite, UA: NEGATIVE
Protein, UA: NEGATIVE
Spec Grav, UA: 1.025
Urobilinogen, UA: 0.2
pH, UA: 6

## 2016-08-19 MED ORDER — NITROFURANTOIN MONOHYD MACRO 100 MG PO CAPS: 100.0000 mg | ORAL_CAPSULE | Freq: Two times a day (BID) | ORAL | 0 refills | Status: DC

## 2016-08-19 NOTE — Progress Notes (Signed)
Chief Complaint  Patient presents with  . Problem urinating    something,mainly at night,x 3 days    Subjective: Patient is a 76 y.o. male here for urinary issue.  Starting 3 days ago, he is having increased urination, pain with urination, discharge on one occasion, and incomplete emptying mainly at night. This has happened to him before when he has had infections. He is circumcised. Denies fevers, flank pain, bleeding, new sexual partners, or testicular pain. He has a hx of prostatitis and states this is not similar.   ROS: GU: As noted in HPI   Family History  Problem Relation Age of Onset  . CAD Father 87    MI   . Colon cancer Neg Hx   . Prostate cancer Neg Hx   . Diabetes Neg Hx    Past Medical History:  Diagnosis Date  . Chronic back pain   . History of headache    frontal lobe cluster headaches  . History of kidney stones   . Post laminectomy syndrome   . Sciatica of right side 2016   S/P L5-S1 revision lam/PSF on 04-10-12 w/ relief of RLE pain post op, MRI 01/2014 shows minimal foraminal stenosis at L4-5 w/o recurrent stenosis at L5-S1   Allergies  Allergen Reactions  . Penicillins Swelling    redness diffusely 1962    Current Outpatient Prescriptions:  .  HYDROcodone-acetaminophen (NORCO) 10-325 MG per tablet, Take 1 tablet by mouth every 6 (six) hours as needed., Disp: , Rfl:  .  morphine (MS CONTIN) 30 MG 12 hr tablet, Take 30 mg by mouth 3 (three) times daily., Disp: , Rfl:  .  Multiple Vitamin (MULITIVITAMIN WITH MINERALS) TABS, Take 1 tablet by mouth daily., Disp: , Rfl:  .  nortriptyline (PAMELOR) 25 MG capsule, Take 1 capsule by mouth 2 (two) times daily., Disp: , Rfl: 1 .  omeprazole (PRILOSEC) 20 MG capsule, Take 1 capsule (20 mg total) by mouth 2 (two) times daily., Disp: 60 capsule, Rfl: 1 .  tizanidine (ZANAFLEX) 2 MG capsule, Take 2-4 mg by mouth every 8 (eight) hours as needed. Not to exceed 3 doses in 24 hours., Disp: , Rfl:  .  nitrofurantoin,  macrocrystal-monohydrate, (MACROBID) 100 MG capsule, Take 1 capsule (100 mg total) by mouth 2 (two) times daily., Disp: 14 capsule, Rfl: 0  Objective: BP 112/70 (BP Location: Left Arm, Patient Position: Sitting, Cuff Size: Large)   Pulse (!) 108   Temp 97.5 F (36.4 C) (Oral)   Ht 5\' 10"  (1.778 m)   Wt 218 lb 6.4 oz (99.1 kg)   SpO2 96%   BMI 31.34 kg/m  General: Awake, appears stated age HEENT: MMM, EOMi Heart: RRR, no murmurs Lungs: CTAB, no rales, wheezes or rhonchi. No accessory muscle use Abd: BS+, soft, NT, mildly distended, no masses or organomegaly Psych: Age appropriate judgment and insight, normal affect and mood  Assessment and Plan: Acute cystitis without hematuria - Plan: nitrofurantoin, macrocrystal-monohydrate, (MACROBID) 100 MG capsule  Urinary frequency - Plan: POCT Urinalysis Dipstick (Automated), Urine Culture  Orders as above. Urine ancillary test to be ordered as well.  If he is not improved by Mon, call for an appt on Mon or Tuesday. Fevers, flank pain, or worsening symptoms warrant immediate care.  The patient voiced understanding and agreement to the plan.  [place], DO 08/19/16  2:20 PM

## 2016-08-19 NOTE — Patient Instructions (Signed)
If you are not improving by Monday, I want you to make an appointment for that day or Tuesday.  If you start having flank pain, fevers, or worsening symptoms, seek immediate care.

## 2016-08-19 NOTE — Progress Notes (Signed)
Pre visit review using our clinic review tool, if applicable. No additional management support is needed unless otherwise documented below in the visit note. 

## 2016-08-22 LAB — URINE CYTOLOGY ANCILLARY ONLY
Chlamydia: NEGATIVE
Neisseria Gonorrhea: NEGATIVE
Trichomonas: NEGATIVE

## 2016-08-23 LAB — URINE CULTURE

## 2016-09-30 DIAGNOSIS — Z79899 Other long term (current) drug therapy: Secondary | ICD-10-CM | POA: Diagnosis not present

## 2016-09-30 DIAGNOSIS — M961 Postlaminectomy syndrome, not elsewhere classified: Secondary | ICD-10-CM | POA: Diagnosis not present

## 2016-09-30 DIAGNOSIS — M545 Low back pain: Secondary | ICD-10-CM | POA: Diagnosis not present

## 2016-09-30 DIAGNOSIS — G894 Chronic pain syndrome: Secondary | ICD-10-CM | POA: Diagnosis not present

## 2016-09-30 DIAGNOSIS — Z79891 Long term (current) use of opiate analgesic: Secondary | ICD-10-CM | POA: Diagnosis not present

## 2016-09-30 DIAGNOSIS — M5431 Sciatica, right side: Secondary | ICD-10-CM | POA: Diagnosis not present

## 2016-09-30 DIAGNOSIS — M4716 Other spondylosis with myelopathy, lumbar region: Secondary | ICD-10-CM | POA: Diagnosis not present

## 2016-11-25 DIAGNOSIS — M545 Low back pain: Secondary | ICD-10-CM | POA: Diagnosis not present

## 2016-11-25 DIAGNOSIS — M5431 Sciatica, right side: Secondary | ICD-10-CM | POA: Diagnosis not present

## 2016-11-25 DIAGNOSIS — M4716 Other spondylosis with myelopathy, lumbar region: Secondary | ICD-10-CM | POA: Diagnosis not present

## 2016-11-25 DIAGNOSIS — M961 Postlaminectomy syndrome, not elsewhere classified: Secondary | ICD-10-CM | POA: Diagnosis not present

## 2017-01-20 DIAGNOSIS — M5431 Sciatica, right side: Secondary | ICD-10-CM | POA: Diagnosis not present

## 2017-01-20 DIAGNOSIS — M545 Low back pain: Secondary | ICD-10-CM | POA: Diagnosis not present

## 2017-01-20 DIAGNOSIS — M961 Postlaminectomy syndrome, not elsewhere classified: Secondary | ICD-10-CM | POA: Diagnosis not present

## 2017-01-20 DIAGNOSIS — Z6831 Body mass index (BMI) 31.0-31.9, adult: Secondary | ICD-10-CM | POA: Diagnosis not present

## 2017-03-16 ENCOUNTER — Encounter: Payer: Self-pay | Admitting: Internal Medicine

## 2017-03-16 ENCOUNTER — Ambulatory Visit (INDEPENDENT_AMBULATORY_CARE_PROVIDER_SITE_OTHER): Payer: PPO | Admitting: Internal Medicine

## 2017-03-16 VITALS — BP 134/78 | HR 106 | Temp 97.8°F | Resp 14 | Ht 70.0 in | Wt 219.4 lb

## 2017-03-16 DIAGNOSIS — Z23 Encounter for immunization: Secondary | ICD-10-CM

## 2017-03-16 DIAGNOSIS — R030 Elevated blood-pressure reading, without diagnosis of hypertension: Secondary | ICD-10-CM

## 2017-03-16 MED ORDER — METOPROLOL TARTRATE 25 MG PO TABS: 25.0000 mg | ORAL_TABLET | Freq: Two times a day (BID) | ORAL | 3 refills | Status: DC

## 2017-03-16 NOTE — Patient Instructions (Addendum)
Start metoprolol twice a day   See you in November   Check the  blood pressure   daily Be sure your blood pressure is between 110/65 and  135/85. If it is consistently higher or lower, let me know   `

## 2017-03-16 NOTE — Assessment & Plan Note (Signed)
Elevated blood pressure: Elevated blood pressure once at the dentist 2 days ago, BPs here are normal. Chronically  has a high heart rate. I am somewhat concerned that his blood pressure will be high again when he goes to the dentist (a combination of pain and anxiety?) and they may not be able to perform the surgery he needs consequently I'll start him on a low-dose of metoprolol 25 mg bid, he will call me if he has any side effects, recommend to monitor BP daily at home. He is in agreement. Flu shot today RTC  November for his physical exam is as scheduled.

## 2017-03-16 NOTE — Progress Notes (Signed)
Subjective:    Patient ID: Bradley Navarro, male    DOB: 25-Dec-1940, 76 y.o.   MRN: [id number]  DOS:  03/16/2017 Type of visit - description : acute Interval history: Has a dental infection, went to his dentist 03/14/2017, he needed extraction, BP was 180/118. Procedure was canceled. Concerned  about his blood pressure. BPs checked at pain management approximately every 2 months and never been told is high. He otherwise is at baseline with chronic pain on pain medication.  BP Readings from Last 3 Encounters:  03/16/17 134/78  08/19/16 112/70  04/19/16 132/88     Review of Systems No chest pain, difficulty breathing or lower extremity edema He does taken ibuprofen daily and has been doing that for long time. No recent sodium intake indiscretions He is under some stress, take care of his wife who had a stroke twice in the last year, but is handling the situation well,  denies being overwhelmed or anxious.  Past Medical History:  Diagnosis Date  . Chronic back pain   . History of headache    frontal lobe cluster headaches  . History of kidney stones   . Post laminectomy syndrome   . Sciatica of right side 2016   S/P L5-S1 revision lam/PSF on 04-10-12 w/ relief of RLE pain post op, MRI 01/2014 shows minimal foraminal stenosis at L4-5 w/o recurrent stenosis at L5-S1    Past Surgical History:  Procedure Laterality Date  . APPENDECTOMY    . LUMBAR DISC SURGERY  2012   L-5, Dr Vennie Homans  . LUMBAR FUSION  2015   rod, plates  . r arm surger      Social History   Social History  . Marital status: Married    Spouse name: N/A  . Number of children: 2  . Years of education: N/A   Occupational History  . retired    Social History Main Topics  . Smoking status: Former Research scientist (life sciences)  . Smokeless tobacco: Never Used     Comment: quit in the 90, light smoker   . Alcohol use No  . Drug use: No  . Sexual activity: Not on file   Other Topics Concern  . Not on file   Social  History Narrative   children 2 and 2 step daughters, has g-kids      Allergies as of 03/16/2017      Reactions   Penicillins Swelling   redness diffusely 1962      Medication List       Accurate as of 03/16/17  5:47 PM. Always use your most recent med list.          HYDROcodone-acetaminophen 10-325 MG tablet Commonly known as:  NORCO Take 1 tablet by mouth every 6 (six) hours as needed.   metoprolol tartrate 25 MG tablet Commonly known as:  LOPRESSOR Take 1 tablet (25 mg total) by mouth 2 (two) times daily.   morphine 30 MG 12 hr tablet Commonly known as:  MS CONTIN Take 30 mg by mouth 3 (three) times daily.   multivitamin with minerals Tabs tablet Take 1 tablet by mouth daily.   nortriptyline 25 MG capsule Commonly known as:  PAMELOR Take 1 capsule by mouth 2 (two) times daily.   omeprazole 20 MG capsule Commonly known as:  PRILOSEC Take 1 capsule (20 mg total) by mouth 2 (two) times daily.   tizanidine 2 MG capsule Commonly known as:  ZANAFLEX Take 2-4 mg by mouth every 8 (eight) hours as  needed. Not to exceed 3 doses in 24 hours.            Discharge Care Instructions        Start     Ordered   03/16/17 0000  Flu vaccine HIGH DOSE PF (Fluzone High dose)     03/16/17 1052   03/16/17 0000  metoprolol tartrate (LOPRESSOR) 25 MG tablet  2 times daily     03/16/17 1052         Objective:   Physical Exam BP 134/78 (BP Location: Left Arm, Patient Position: Sitting, Cuff Size: Small)   Pulse (!) 106   Temp 97.8 F (36.6 C) (Oral)   Resp 14   Ht 5\' 10"  (1.778 m)   Wt 219 lb 6 oz (99.5 kg)   SpO2 96%   BMI 31.48 kg/m  General:   Well developed, well nourished . NAD.  HEENT:  Normocephalic . Face symmetric, atraumatic Lungs:  CTA B Normal respiratory effort, no intercostal retractions, no accessory muscle use. Heart: RRR,  no murmur.  No pretibial edema bilaterally  Skin: Not pale. Not jaundice Neurologic:  alert & oriented X3.  Speech  normal, gait appropriate for age and unassisted Psych--  Cognition and judgment appear intact.  Cooperative with normal attention span and concentration.  Behavior appropriate. No anxious or depressed appearing.      Assessment & Plan:   Assessment>  High cholesterol h/o HA-- s/p extensive w/u in the past. Has seen Dr Maureen Chatters and neuro @ [facility]   h/o kidney stones  Chronic back pain-- sees pain management, see surgeries  Chronic hoarseness: Never seen by ENT  PLAN Elevated blood pressure: Elevated blood pressure once at the dentist 2 days ago, BPs here are normal. Chronically  has a high heart rate. I am somewhat concerned that his blood pressure will be high again when he goes to the dentist (a combination of pain and anxiety?) and they may not be able to perform the surgery he needs consequently I'll start him on a low-dose of metoprolol 25 mg bid, he will call me if he has any side effects, recommend to monitor BP daily at home. He is in agreement. Flu shot today RTC  November for his physical exam is as scheduled.

## 2017-03-16 NOTE — Progress Notes (Signed)
Pre visit review using our clinic review tool, if applicable. No additional management support is needed unless otherwise documented below in the visit note. 

## 2017-03-24 DIAGNOSIS — M545 Low back pain: Secondary | ICD-10-CM | POA: Diagnosis not present

## 2017-03-24 DIAGNOSIS — M961 Postlaminectomy syndrome, not elsewhere classified: Secondary | ICD-10-CM | POA: Diagnosis not present

## 2017-03-24 DIAGNOSIS — M5431 Sciatica, right side: Secondary | ICD-10-CM | POA: Diagnosis not present

## 2017-04-24 ENCOUNTER — Ambulatory Visit (INDEPENDENT_AMBULATORY_CARE_PROVIDER_SITE_OTHER): Payer: PPO | Admitting: Internal Medicine

## 2017-04-24 ENCOUNTER — Encounter: Payer: Self-pay | Admitting: Internal Medicine

## 2017-04-24 VITALS — BP 124/78 | HR 79 | Temp 97.5°F | Resp 14 | Ht 70.0 in | Wt 219.0 lb

## 2017-04-24 DIAGNOSIS — Z0000 Encounter for general adult medical examination without abnormal findings: Secondary | ICD-10-CM | POA: Diagnosis not present

## 2017-04-24 DIAGNOSIS — R030 Elevated blood-pressure reading, without diagnosis of hypertension: Secondary | ICD-10-CM

## 2017-04-24 DIAGNOSIS — I10 Essential (primary) hypertension: Secondary | ICD-10-CM | POA: Insufficient documentation

## 2017-04-24 HISTORY — DX: Elevated blood-pressure reading, without diagnosis of hypertension: R03.0

## 2017-04-24 NOTE — Patient Instructions (Addendum)
  GO TO THE FRONT DESK Schedule labs to be done fasting this week  Schedule your next appointment for a physical exam in 1 year

## 2017-04-24 NOTE — Progress Notes (Signed)
Pre visit review using our clinic review tool, if applicable. No additional management support is needed unless otherwise documented below in the visit note. 

## 2017-04-24 NOTE — Progress Notes (Signed)
Subjective:    Patient ID: Bradley Navarro, male    DOB: [birth year], 76 y.o.   MRN: [id number]  DOS:  04/24/2017 Type of visit - description : cpx Interval history: In general doing well Taking metoprolol, normal ambulatory BPs without apparent side effects.  Review of Systems   Back pain at baseline, slightly worse? Other than above, a 14 point review of systems is negative     Past Medical History:  Diagnosis Date  . Chronic back pain   . Elevated BP without diagnosis of hypertension 04/24/2017  . History of headache    frontal lobe cluster headaches  . History of kidney stones   . Post laminectomy syndrome   . Sciatica of right side 2016   S/P L5-S1 revision lam/PSF on 04-10-12 w/ relief of RLE pain post op, MRI 01/2014 shows minimal foraminal stenosis at L4-5 w/o recurrent stenosis at L5-S1    Past Surgical History:  Procedure Laterality Date  . APPENDECTOMY    . LUMBAR DISC SURGERY  2012   L-5, Dr Vennie Homans  . LUMBAR FUSION  2015   rod, plates  . r arm surger      Social History   Socioeconomic History  . Marital status: Married    Spouse name: Not on file  . Number of children: 2  . Years of education: Not on file  . Highest education level: Not on file  Social Needs  . Financial resource strain: Not on file  . Food insecurity - worry: Not on file  . Food insecurity - inability: Not on file  . Transportation needs - medical: Not on file  . Transportation needs - non-medical: Not on file  Occupational History  . Occupation: retired  Tobacco Use  . Smoking status: Former Research scientist (life sciences)  . Smokeless tobacco: Never Used  . Tobacco comment: quit in the 90, light smoker   Substance and Sexual Activity  . Alcohol use: No    Alcohol/week: 0.0 oz  . Drug use: No  . Sexual activity: Not on file  Other Topics Concern  . Not on file  Social History Narrative   Household: pt, wife, 2 cats   children 2 and 2 step daughters, has g-kids     Family History  Problem  Relation Age of Onset  . CAD Father 65       MI   . Colon cancer Neg Hx   . Prostate cancer Neg Hx   . Diabetes Neg Hx      Allergies as of 04/24/2017      Reactions   Penicillins Swelling   redness diffusely 1962      Medication List        Accurate as of 04/24/17 11:59 PM. Always use your most recent med list.          HYDROcodone-acetaminophen 10-325 MG tablet Commonly known as:  NORCO Take 1 tablet by mouth every 6 (six) hours as needed.   metoprolol tartrate 25 MG tablet Commonly known as:  LOPRESSOR Take 1 tablet (25 mg total) by mouth 2 (two) times daily.   morphine 30 MG 12 hr tablet Commonly known as:  MS CONTIN Take 30 mg by mouth 3 (three) times daily.   multivitamin with minerals Tabs tablet Take 1 tablet by mouth daily.   omeprazole 20 MG capsule Commonly known as:  PRILOSEC Take 1 capsule (20 mg total) by mouth 2 (two) times daily.   tizanidine 2 MG capsule Commonly known as:  ZANAFLEX Take 2-4 mg by mouth every 8 (eight) hours as needed. Not to exceed 3 doses in 24 hours.          Objective:   Physical Exam BP 124/78 (BP Location: Left Arm, Patient Position: Sitting, Cuff Size: Normal)   Pulse 79   Temp (!) 97.5 F (36.4 C) (Oral)   Resp 14   Ht 5\' 10"  (1.778 m)   Wt 219 lb (99.3 kg)   SpO2 95%   BMI 31.42 kg/m  General:   Well developed, well nourished . NAD.  HEENT:  Normocephalic . Face symmetric, atraumatic Lungs:  CTA B Normal respiratory effort, no intercostal retractions, no accessory muscle use. Heart: RRR,  no murmur.  No pretibial edema bilaterally  Skin: Not pale. Not jaundice Neurologic:  alert & oriented X3.  Speech normal, gait, transferring and posture limited by pain.  Gait is assisted Psych--  Cognition and judgment appear intact.  Cooperative with normal attention span and concentration.  Behavior appropriate. No anxious or depressed appearing.      Assessment & Plan:   Assessment>  Elevated BP. High  cholesterol h/o HA-- s/p extensive w/u in the past. Has seen Dr Maureen Chatters and neuro @ [facility]   h/o kidney stones  Chronic back pain-- sees pain management, see surgeries Chronic hoarseness: Never seen by ENT Sees dermatology   PLAN Elevated BP: Started metoprolol, BP is well controlled. High cholesterol: Diet controlled, checking labs Other problems seem stable. RTC one year

## 2017-04-24 NOTE — Assessment & Plan Note (Addendum)
-  Td 2016; pnm 23: 2011; Prevnar 2016, had a flu shot  Never got the zostavas, shingrix discussed  -CCS:  Never had a colonoscopy, not interested  Prostate cancer screening:   not interested Diet: Discussed Exercise :very limited   Labs: Will return fasting for a CMP, FLP, CBC, A1c, TSH

## 2017-04-25 NOTE — Assessment & Plan Note (Signed)
PLAN Elevated BP: Started metoprolol, BP is well controlled. High cholesterol: Diet controlled, checking labs Other problems seem stable. RTC one year

## 2017-05-01 ENCOUNTER — Other Ambulatory Visit (INDEPENDENT_AMBULATORY_CARE_PROVIDER_SITE_OTHER): Payer: PPO

## 2017-05-01 DIAGNOSIS — Z0000 Encounter for general adult medical examination without abnormal findings: Secondary | ICD-10-CM | POA: Diagnosis not present

## 2017-05-01 LAB — COMPREHENSIVE METABOLIC PANEL
ALT: 18 U/L (ref 0–53)
AST: 19 U/L (ref 0–37)
Albumin: 4.7 g/dL (ref 3.5–5.2)
Alkaline Phosphatase: 58 U/L (ref 39–117)
BUN: 13 mg/dL (ref 6–23)
CO2: 30 mEq/L (ref 19–32)
Calcium: 10.4 mg/dL (ref 8.4–10.5)
Chloride: 101 mEq/L (ref 96–112)
Creatinine, Ser: 0.82 mg/dL (ref 0.40–1.50)
GFR: 97.3 mL/min (ref >60.00)
Glucose, Bld: 114 mg/dL — ABNORMAL HIGH (ref 70–99)
Potassium: 4.8 mEq/L (ref 3.5–5.1)
Sodium: 140 mEq/L (ref 135–145)
Total Bilirubin: 1 mg/dL (ref 0.2–1.2)
Total Protein: 7.5 g/dL (ref 6.0–8.3)

## 2017-05-01 LAB — CBC WITH DIFFERENTIAL/PLATELET
Basophils Absolute: 0.1 10*3/uL (ref 0.0–0.1)
Basophils Relative: 0.9 % (ref 0.0–3.0)
Eosinophils Absolute: 0.1 10*3/uL (ref 0.0–0.7)
Eosinophils Relative: 2 % (ref 0.0–5.0)
HCT: 45.5 % (ref 39.0–52.0)
Hemoglobin: 15.3 g/dL (ref 13.0–17.0)
Lymphocytes Relative: 36.3 % (ref 12.0–46.0)
Lymphs Abs: 2.3 10*3/uL (ref 0.7–4.0)
MCHC: 33.6 g/dL (ref 30.0–36.0)
MCV: 96.8 fl (ref 78.0–100.0)
Monocytes Absolute: 0.5 10*3/uL (ref 0.1–1.0)
Monocytes Relative: 7.4 % (ref 3.0–12.0)
Neutro Abs: 3.4 10*3/uL (ref 1.4–7.7)
Neutrophils Relative %: 53.4 % (ref 43.0–77.0)
Platelets: 240 10*3/uL (ref 150.0–400.0)
RBC: 4.69 Mil/uL (ref 4.22–5.81)
RDW: 12.9 % (ref 11.5–15.5)
WBC: 6.4 10*3/uL (ref 4.0–10.5)

## 2017-05-01 LAB — LIPID PANEL
Cholesterol: 248 mg/dL — ABNORMAL HIGH (ref 0–200)
HDL: 37.3 mg/dL — ABNORMAL LOW (ref >39.00)
NonHDL: 210.79
Total CHOL/HDL Ratio: 7
Triglycerides: 373 mg/dL — ABNORMAL HIGH (ref 0.0–149.0)
VLDL: 74.6 mg/dL — ABNORMAL HIGH (ref 0.0–40.0)

## 2017-05-01 LAB — LDL CHOLESTEROL, DIRECT: Direct LDL: 140 mg/dL

## 2017-05-01 LAB — HEMOGLOBIN A1C: Hgb A1c MFr Bld: 5.8 % (ref 4.6–6.5)

## 2017-05-01 LAB — TSH: TSH: 3.07 u[IU]/mL (ref 0.35–4.50)

## 2017-05-26 DIAGNOSIS — M545 Low back pain: Secondary | ICD-10-CM | POA: Diagnosis not present

## 2017-05-26 DIAGNOSIS — Z79899 Other long term (current) drug therapy: Secondary | ICD-10-CM | POA: Diagnosis not present

## 2017-05-26 DIAGNOSIS — M961 Postlaminectomy syndrome, not elsewhere classified: Secondary | ICD-10-CM | POA: Diagnosis not present

## 2017-05-26 DIAGNOSIS — M5431 Sciatica, right side: Secondary | ICD-10-CM | POA: Diagnosis not present

## 2017-06-19 ENCOUNTER — Encounter: Payer: Self-pay | Admitting: Internal Medicine

## 2017-06-19 ENCOUNTER — Ambulatory Visit: Payer: PPO | Admitting: Internal Medicine

## 2017-06-19 VITALS — BP 128/82 | HR 83 | Temp 97.6°F | Resp 14 | Ht 70.0 in | Wt 216.4 lb

## 2017-06-19 DIAGNOSIS — N202 Calculus of kidney with calculus of ureter: Secondary | ICD-10-CM | POA: Diagnosis not present

## 2017-06-19 DIAGNOSIS — R399 Unspecified symptoms and signs involving the genitourinary system: Secondary | ICD-10-CM

## 2017-06-19 DIAGNOSIS — R319 Hematuria, unspecified: Secondary | ICD-10-CM

## 2017-06-19 LAB — POC URINALSYSI DIPSTICK (AUTOMATED)
Bilirubin, UA: NEGATIVE
Glucose, UA: NEGATIVE
Ketones, UA: NEGATIVE
Leukocytes, UA: NEGATIVE
Nitrite, UA: NEGATIVE
Protein, UA: NEGATIVE
Spec Grav, UA: 1.02 (ref 1.010–1.025)
Urobilinogen, UA: 0.2 E.U./dL
pH, UA: 6 (ref 5.0–8.0)

## 2017-06-19 MED ORDER — ONDANSETRON HCL 4 MG PO TABS: 4.0000 mg | ORAL_TABLET | Freq: Three times a day (TID) | ORAL | 0 refills | Status: DC | PRN

## 2017-06-19 MED ORDER — TAMSULOSIN HCL 0.4 MG PO CAPS: 0.4000 mg | ORAL_CAPSULE | Freq: Every day | ORAL | 0 refills | Status: DC

## 2017-06-19 NOTE — Progress Notes (Signed)
Pre visit review using our clinic review tool, if applicable. No additional management support is needed unless otherwise documented below in the visit note. 

## 2017-06-19 NOTE — Progress Notes (Signed)
Subjective:    Patient ID: Bradley Navarro, male    DOB: 01/18/1941, 76 y.o.   MRN: [id number]  DOS:  06/19/2017 Type of visit - description : acute Interval history: Symptoms of started 2 days ago: Having pain at the lower abdomen bilaterally, right-sided abdomen and some discomfort at the right testicle. He suspect a UTI or a kidney stone. Symptoms are on and off, when he is in pain he has associated nausea.  Review of Systems  Denies fever chills No vomiting, diarrhea No dysuria or gross hematuria Bowel movements at baseline, he takes narcotics and uses MiraLAX as needed.  Last BM was today. Appetite decreased  Past Medical History:  Diagnosis Date  . Chronic back pain   . Elevated BP without diagnosis of hypertension 04/24/2017  . History of headache    frontal lobe cluster headaches  . History of kidney stones   . Post laminectomy syndrome   . Sciatica of right side 2016   S/P L5-S1 revision lam/PSF on 04-10-12 w/ relief of RLE pain post op, MRI 01/2014 shows minimal foraminal stenosis at L4-5 w/o recurrent stenosis at L5-S1    Past Surgical History:  Procedure Laterality Date  . APPENDECTOMY    . LUMBAR DISC SURGERY  2012   L-5, Dr Vennie Homans  . LUMBAR FUSION  2015   rod, plates  . r arm surger      Social History   Socioeconomic History  . Marital status: Married    Spouse name: Not on file  . Number of children: 2  . Years of education: Not on file  . Highest education level: Not on file  Social Needs  . Financial resource strain: Not on file  . Food insecurity - worry: Not on file  . Food insecurity - inability: Not on file  . Transportation needs - medical: Not on file  . Transportation needs - non-medical: Not on file  Occupational History  . Occupation: retired  Tobacco Use  . Smoking status: Former Research scientist (life sciences)  . Smokeless tobacco: Never Used  . Tobacco comment: quit in the 90, light smoker   Substance and Sexual Activity  . Alcohol use: No   Alcohol/week: 0.0 oz  . Drug use: No  . Sexual activity: Not on file  Other Topics Concern  . Not on file  Social History Narrative   Household: pt, wife, 2 cats   children 2 and 2 step daughters, has g-kids      Allergies as of 06/19/2017      Reactions   Penicillins Swelling   redness diffusely 1962      Medication List        Accurate as of 06/19/17 11:59 PM. Always use your most recent med list.          HYDROcodone-acetaminophen 10-325 MG tablet Commonly known as:  NORCO Take 1 tablet by mouth every 6 (six) hours as needed.   metoprolol tartrate 25 MG tablet Commonly known as:  LOPRESSOR Take 1 tablet (25 mg total) by mouth 2 (two) times daily.   morphine 30 MG 12 hr tablet Commonly known as:  MS CONTIN Take 30 mg by mouth 3 (three) times daily.   multivitamin with minerals Tabs tablet Take 1 tablet by mouth daily.   omeprazole 20 MG capsule Commonly known as:  PRILOSEC Take 1 capsule (20 mg total) by mouth 2 (two) times daily.   ondansetron 4 MG tablet Commonly known as:  ZOFRAN Take 1 tablet (4 mg  total) by mouth every 8 (eight) hours as needed for nausea or vomiting.   tamsulosin 0.4 MG Caps capsule Commonly known as:  FLOMAX Take 1 capsule (0.4 mg total) by mouth daily.   tizanidine 2 MG capsule Commonly known as:  ZANAFLEX Take 2-4 mg by mouth every 8 (eight) hours as needed. Not to exceed 3 doses in 24 hours.          Objective:   Physical Exam BP 128/82 (BP Location: Left Arm, Patient Position: Sitting, Cuff Size: Normal)   Pulse 83   Temp 97.6 F (36.4 C) (Oral)   Resp 14   Ht 5\' 10"  (1.778 m)   Wt 216 lb 6 oz (98.1 kg)   SpO2 98%   BMI 31.05 kg/m  General:   Well developed, well nourished . NAD.  HEENT:  Normocephalic . Face symmetric, atraumatic Lungs:  CTA B Normal respiratory effort, no intercostal retractions, no accessory muscle use. Heart: RRR,  no murmur.  no pretibial edema bilaterally  Abdomen:  Not distended,  soft, non-tender. No rebound or rigidity.    No CVA tenderness. Skin: Not pale. Not jaundice Neurologic:  alert & oriented X3.  Speech normal, gait appropriate for age and unassisted Psych--  Cognition and judgment appear intact.  Cooperative with normal attention span and concentration.  Behavior appropriate. No anxious or depressed appearing.     Assessment & Plan:    Assessment>  Elevated BP. High cholesterol h/o HA-- s/p extensive w/u in the past. Has seen Dr Maureen Chatters and neuro @ [facility]   h/o kidney stones , multiple Chronic back pain-- sees pain management, see surgeries Chronic hoarseness: Never seen by ENT Sees dermatology   PLAN  Urolithiasis: Right abdominal and lower abdominal pain likely due to a urethral stone. He has normal renal function, S/P appendectomy, few years ago a R renal calculus was noted on a CT. Urinalysis today shows some blood.  UA and urine culture pending. We will get renal ultrasound today and if unable to obtain it todsay will do it 06-21-2017. Plan: Good hydration, strainer provided, start Flomax, Zofran for nausea, ER if symptoms increase, fever or chills.  He would let me know if not improving soon. (Addendum: Patient unable to do the ultrasound today, plans to proceed 06-21-17)

## 2017-06-19 NOTE — Patient Instructions (Addendum)
We are trying to get a ultrasound of your kidney today or Wednesday  Drink plenty of fluids  Flomax daily  Take Zofran as needed for nausea  Go to the ER if you have persistent or severe pain.  Also if you have fever, chills, severe nausea.  Strain your urine

## 2017-06-20 LAB — URINALYSIS, ROUTINE W REFLEX MICROSCOPIC
Bacteria, UA: NONE SEEN /HPF
Bilirubin Urine: NEGATIVE
Glucose, UA: NEGATIVE
Hyaline Cast: NONE SEEN /LPF
Ketones, ur: NEGATIVE
Leukocytes, UA: NEGATIVE
Nitrite: NEGATIVE
Protein, ur: NEGATIVE
Specific Gravity, Urine: 1.01 (ref 1.001–1.03)
Squamous Epithelial / LPF: NONE SEEN /HPF (ref <=5)
WBC, UA: NONE SEEN /HPF (ref 0–5)
pH: 5.5 (ref 5.0–8.0)

## 2017-06-20 LAB — URINE CULTURE
MICRO NUMBER:: 81463625
Result:: NO GROWTH
SPECIMEN QUALITY:: ADEQUATE

## 2017-06-20 NOTE — Assessment & Plan Note (Signed)
Urolithiasis: Right abdominal and lower abdominal pain likely due to a urethral stone. He has normal renal function, S/P appendectomy, few years ago a R renal calculus was noted on a CT. Urinalysis today shows some blood.  UA and urine culture pending. We will get renal ultrasound today and if unable to obtain it todsay will do it 06-21-2017. Plan: Good hydration, strainer provided, start Flomax, Zofran for nausea, ER if symptoms increase, fever or chills.  He would let me know if not improving soon. (Addendum: Patient unable to do the ultrasound today, plans to proceed 06-21-17)

## 2017-06-21 ENCOUNTER — Ambulatory Visit (HOSPITAL_BASED_OUTPATIENT_CLINIC_OR_DEPARTMENT_OTHER)
Admission: RE | Admit: 2017-06-21 | Discharge: 2017-06-21 | Disposition: A | Discharge status: Home / Self Care | Payer: PPO | Source: Ambulatory Visit | Attending: Internal Medicine | Admitting: Internal Medicine

## 2017-06-21 DIAGNOSIS — R319 Hematuria, unspecified: Secondary | ICD-10-CM | POA: Insufficient documentation

## 2017-06-21 NOTE — Addendum Note (Signed)
Addended byDamita Dunnings D on: 06/21/2017 03:47 PM   Modules accepted: Orders

## 2017-06-22 ENCOUNTER — Telehealth: Payer: Self-pay

## 2017-06-22 NOTE — Telephone Encounter (Signed)
Pt added to schedule

## 2017-06-22 NOTE — Telephone Encounter (Signed)
Copied from [place] [id number]. Topic: General - Other >> Jun 22, 2017  8:57 AM Carolyn Stare wrote:  Pt call to say Dr Larose Kells told him to call back if there was any change and he call to say there is a lot of change and he would like a call back [phone]   He said Dr Larose Kells is treating him for a kidney infection

## 2017-06-22 NOTE — Telephone Encounter (Signed)
Spoke with the patient, developed dysuria and urinary frequency last night. No fever chills No flank pain, some lower abdomen discomfort.  No gross hematuria. After all, he may have stone in the bladder trying to come out.  Prostatitis is in the differential.  Recommend to continue straining his urine and come back tomorrow at 11 am (work-in) for a checkup, likely need a DRE and a PSA just to rule out prostatitis.

## 2017-06-22 NOTE — Telephone Encounter (Signed)
Pt requesting to speak with you

## 2017-06-23 ENCOUNTER — Encounter: Payer: Self-pay | Admitting: Internal Medicine

## 2017-06-23 ENCOUNTER — Ambulatory Visit (INDEPENDENT_AMBULATORY_CARE_PROVIDER_SITE_OTHER): Payer: PPO | Admitting: Internal Medicine

## 2017-06-23 VITALS — BP 126/70 | HR 75 | Temp 97.5°F | Resp 14 | Ht 70.0 in | Wt 216.4 lb

## 2017-06-23 DIAGNOSIS — R310 Gross hematuria: Secondary | ICD-10-CM

## 2017-06-23 LAB — POC URINALSYSI DIPSTICK (AUTOMATED)
Bilirubin, UA: NEGATIVE
Blood, UA: NEGATIVE
Glucose, UA: NEGATIVE
Ketones, UA: NEGATIVE
Leukocytes, UA: NEGATIVE
Nitrite, UA: NEGATIVE
Protein, UA: NEGATIVE
Spec Grav, UA: >=1.03 — AB (ref 1.010–1.025)
Urobilinogen, UA: 0.2 E.U./dL
pH, UA: 6 (ref 5.0–8.0)

## 2017-06-23 LAB — PSA: PSA: 1.84 ng/mL (ref 0.10–4.00)

## 2017-06-23 NOTE — Patient Instructions (Addendum)
GO TO THE LAB : Get the blood work     Proceed with the urologist referral

## 2017-06-23 NOTE — Progress Notes (Signed)
Subjective:    Patient ID: Bradley Navarro, male    DOB: [birth year], 77 y.o.   MRN: [id number]  DOS:  06/23/2017 Type of visit - description : acute Interval history: Since the last visit 06/19/2017, abdominal and right testicle discomfort gradually decrease however he developed dysuria, urinary frequency 06-21-2017, he did see small clots in the urine.  Symptoms gradually resolved yesterday, feeling well today.   Review of Systems No further gross hematuria No fever chills No nausea or vomiting No flank pain  Past Medical History:  Diagnosis Date  . Chronic back pain   . Elevated BP without diagnosis of hypertension 04/24/2017  . History of headache    frontal lobe cluster headaches  . History of kidney stones   . Post laminectomy syndrome   . Sciatica of right side 2016   S/P L5-S1 revision lam/PSF on 04-10-12 w/ relief of RLE pain post op, MRI 01/2014 shows minimal foraminal stenosis at L4-5 w/o recurrent stenosis at L5-S1    Past Surgical History:  Procedure Laterality Date  . APPENDECTOMY    . LUMBAR DISC SURGERY  2012   L-5, Dr Vennie Homans  . LUMBAR FUSION  2015   rod, plates  . r arm surger      Social History   Socioeconomic History  . Marital status: Married    Spouse name: Not on file  . Number of children: 2  . Years of education: Not on file  . Highest education level: Not on file  Social Needs  . Financial resource strain: Not on file  . Food insecurity - worry: Not on file  . Food insecurity - inability: Not on file  . Transportation needs - medical: Not on file  . Transportation needs - non-medical: Not on file  Occupational History  . Occupation: retired  Tobacco Use  . Smoking status: Former Research scientist (life sciences)  . Smokeless tobacco: Never Used  . Tobacco comment: quit in the 90, light smoker   Substance and Sexual Activity  . Alcohol use: No    Alcohol/week: 0.0 oz  . Drug use: No  . Sexual activity: Not on file  Other Topics Concern  . Not on file    Social History Narrative   Household: pt, wife, 2 cats   children 2 and 2 step daughters, has g-kids      Allergies as of 06/23/2017      Reactions   Penicillins Swelling   redness diffusely 1962      Medication List        Accurate as of 06/23/17 11:20 AM. Always use your most recent med list.          HYDROcodone-acetaminophen 10-325 MG tablet Commonly known as:  NORCO Take 1 tablet by mouth every 6 (six) hours as needed.   metoprolol tartrate 25 MG tablet Commonly known as:  LOPRESSOR Take 1 tablet (25 mg total) by mouth 2 (two) times daily.   morphine 30 MG 12 hr tablet Commonly known as:  MS CONTIN Take 30 mg by mouth 3 (three) times daily.   multivitamin with minerals Tabs tablet Take 1 tablet by mouth daily.   omeprazole 20 MG capsule Commonly known as:  PRILOSEC Take 1 capsule (20 mg total) by mouth 2 (two) times daily.   ondansetron 4 MG tablet Commonly known as:  ZOFRAN Take 1 tablet (4 mg total) by mouth every 8 (eight) hours as needed for nausea or vomiting.   tamsulosin 0.4 MG Caps capsule Commonly known  as:  FLOMAX Take 1 capsule (0.4 mg total) by mouth daily.   tizanidine 2 MG capsule Commonly known as:  ZANAFLEX Take 2-4 mg by mouth every 8 (eight) hours as needed. Not to exceed 3 doses in 24 hours.          Objective:   Physical Exam BP 126/70 (BP Location: Left Arm, Patient Position: Sitting, Cuff Size: Small)   Pulse 75   Temp (!) 97.5 F (36.4 C) (Oral)   Resp 14   Ht 5\' 10"  (1.778 m)   Wt 216 lb 6 oz (98.1 kg)   SpO2 98%   BMI 31.05 kg/m  General:   Well developed, well nourished . NAD.  HEENT:  Normocephalic . Face symmetric, atraumatic Abdomen:  Not distended, soft, non-tender. No rebound or rigidity.   DRE: Small, nontender prostate. Skin: Not pale. Not jaundice Neurologic:  alert & oriented X3.  Speech normal, gait appropriate for age and unassisted Psych--  Cognition and judgment appear intact.  Cooperative with  normal attention span and concentration.  Behavior appropriate. No anxious or depressed appearing.     Assessment & Plan:    Assessment>  Elevated BP. High cholesterol h/o HA-- s/p extensive w/u in the past. Has seen Dr Maureen Chatters and neuro @ [facility]   h/o kidney stones , multiple Chronic back pain-- sees pain management, see surgeries Chronic hoarseness: Never seen by ENT Sees dermatology   PLAN  Urolithiasis: Since the last visit, abdominal pain resolved, [place] was (-),urinalysis confirmed microhematuria.  Has not pass a stone that he can tell.  He had dysuria , urinary frequency for several hours and saw  few small blood clots in the urine, I wonders if a stone was missed on the [place] and he has a bladder stone.   On today's exam, the prostate is normal, Udid negative for blood today  By definition he has micro/macroscopic hematuria w/o a explanation Plan: Urology referral as already planned, check a PSA for completeness. Gross hematuria: For a few hours 2 days ago, see above

## 2017-06-23 NOTE — Progress Notes (Signed)
Pre visit review using our clinic review tool, if applicable. No additional management support is needed unless otherwise documented below in the visit note. 

## 2017-06-25 NOTE — Assessment & Plan Note (Signed)
Urolithiasis: Since the last visit, abdominal pain resolved, [place] was (-),urinalysis confirmed microhematuria.  Has not pass a stone that he can tell.  He had dysuria , urinary frequency for several hours and saw  few small blood clots in the urine, I wonders if a stone was missed on the [place] and he has a bladder stone.   On today's exam, the prostate is normal, Udid negative for blood today  By definition he has micro/macroscopic hematuria w/o a explanation Plan: Urology referral as already planned, check a PSA for completeness. Gross hematuria: For a few hours 2 days ago, see above

## 2017-07-05 ENCOUNTER — Other Ambulatory Visit: Payer: Self-pay | Admitting: Internal Medicine

## 2017-07-28 DIAGNOSIS — M961 Postlaminectomy syndrome, not elsewhere classified: Secondary | ICD-10-CM | POA: Diagnosis not present

## 2017-07-28 DIAGNOSIS — M5431 Sciatica, right side: Secondary | ICD-10-CM | POA: Diagnosis not present

## 2017-09-22 DIAGNOSIS — M545 Low back pain: Secondary | ICD-10-CM | POA: Diagnosis not present

## 2017-09-22 DIAGNOSIS — M961 Postlaminectomy syndrome, not elsewhere classified: Secondary | ICD-10-CM | POA: Diagnosis not present

## 2017-09-22 DIAGNOSIS — M5431 Sciatica, right side: Secondary | ICD-10-CM | POA: Diagnosis not present

## 2017-11-24 DIAGNOSIS — M5431 Sciatica, right side: Secondary | ICD-10-CM | POA: Diagnosis not present

## 2018-01-08 ENCOUNTER — Encounter: Payer: Self-pay | Admitting: Internal Medicine

## 2018-01-08 DIAGNOSIS — L570 Actinic keratosis: Secondary | ICD-10-CM | POA: Diagnosis not present

## 2018-01-08 DIAGNOSIS — D485 Neoplasm of uncertain behavior of skin: Secondary | ICD-10-CM | POA: Diagnosis not present

## 2018-01-19 DIAGNOSIS — M5431 Sciatica, right side: Secondary | ICD-10-CM | POA: Diagnosis not present

## 2018-03-20 ENCOUNTER — Other Ambulatory Visit: Payer: Self-pay | Admitting: Internal Medicine

## 2018-03-23 ENCOUNTER — Ambulatory Visit: Payer: Self-pay | Admitting: Internal Medicine

## 2018-03-23 DIAGNOSIS — M5431 Sciatica, right side: Secondary | ICD-10-CM | POA: Diagnosis not present

## 2018-03-23 DIAGNOSIS — G894 Chronic pain syndrome: Secondary | ICD-10-CM | POA: Diagnosis not present

## 2018-03-23 DIAGNOSIS — Z79899 Other long term (current) drug therapy: Secondary | ICD-10-CM | POA: Diagnosis not present

## 2018-03-23 DIAGNOSIS — Z6831 Body mass index (BMI) 31.0-31.9, adult: Secondary | ICD-10-CM | POA: Diagnosis not present

## 2018-03-23 DIAGNOSIS — Z79891 Long term (current) use of opiate analgesic: Secondary | ICD-10-CM | POA: Diagnosis not present

## 2018-03-23 NOTE — Telephone Encounter (Signed)
Pt went to his back doctor today and said his bp was high. Pt does not know the reading. Dr Larose Kells is md.

## 2018-03-23 NOTE — Telephone Encounter (Signed)
Pt. States he was at his orthopedic doctor this morning and his BP was 153/92. Has felt "a little swimmy headed for about 1 week, on and off." No other symptoms.Appointment made for Monday. Instructed to go to ED if symptoms worsen. Verbalizes understanding. Reason for Disposition . [3] Systolic BP  >= 354 OR Diastolic >= 80 AND [5] taking BP medications  Answer Assessment - Initial Assessment Questions 1. BLOOD PRESSURE: "What is the blood pressure?" "Did you take at least two measurements 5 minutes apart?"     Orthopedic doctor  153/92 2. ONSET: "When did you take your blood pressure?"     This morning 3. HOW: "How did you obtain the blood pressure?" (e.g., visiting nurse, automatic home BP monitor)     Doctor's office 4. HISTORY: "Do you have a history of high blood pressure?"     Yes 5. MEDICATIONS: "Are you taking any medications for blood pressure?" "Have you missed any doses recently?"     Yes 6. OTHER SYMPTOMS: "Do you have any symptoms?" (e.g., headache, chest pain, blurred vision, difficulty breathing, weakness)    "Swimmy headed" x 1 week 7. PREGNANCY: "Is there any chance you are pregnant?" "When was your last menstrual period?"     N/A  Protocols used: HIGH BLOOD PRESSURE-A-AH

## 2018-03-26 ENCOUNTER — Encounter: Payer: Self-pay | Admitting: Internal Medicine

## 2018-03-26 ENCOUNTER — Ambulatory Visit (INDEPENDENT_AMBULATORY_CARE_PROVIDER_SITE_OTHER): Payer: PPO | Admitting: Internal Medicine

## 2018-03-26 VITALS — BP 148/62 | HR 68 | Temp 97.8°F | Resp 16 | Ht 70.0 in | Wt 215.4 lb

## 2018-03-26 DIAGNOSIS — I10 Essential (primary) hypertension: Secondary | ICD-10-CM | POA: Diagnosis not present

## 2018-03-26 DIAGNOSIS — Z23 Encounter for immunization: Secondary | ICD-10-CM | POA: Diagnosis not present

## 2018-03-26 DIAGNOSIS — R030 Elevated blood-pressure reading, without diagnosis of hypertension: Secondary | ICD-10-CM | POA: Diagnosis not present

## 2018-03-26 MED ORDER — LOSARTAN POTASSIUM 50 MG PO TABS: 50.0000 mg | ORAL_TABLET | Freq: Every day | ORAL | 1 refills | Status: DC

## 2018-03-26 NOTE — Progress Notes (Signed)
Subjective:    Patient ID: Bradley Navarro, male    DOB: 07/31/1940, 77 y.o.   MRN: [id number]  DOS:  03/26/2018 Type of visit - description : acute Interval history: 4 days ago, went to his pain management doctor, systolic BP was 903. Since then, has check his blood pressure multiple times, range from 179/101, to 139/85. Chronic back pain: Not as well-controlled as he would like to.   Review of Systems Denies chest pain, difficulty breathing. No nausea, vomiting, diarrhea No lower extremity edema Good compliance with beta-blockers Has felt occasional dizzy but denies stroke symptoms such as headache, double vision, face numbness.   Past Medical History:  Diagnosis Date  . Chronic back pain   . Elevated BP without diagnosis of hypertension 04/24/2017  . History of headache    frontal lobe cluster headaches  . History of kidney stones   . Post laminectomy syndrome   . Sciatica of right side 2016   S/P L5-S1 revision lam/PSF on 04-10-12 w/ relief of RLE pain post op, MRI 01/2014 shows minimal foraminal stenosis at L4-5 w/o recurrent stenosis at L5-S1    Past Surgical History:  Procedure Laterality Date  . APPENDECTOMY    . LUMBAR DISC SURGERY  2012   L-5, Dr Vennie Homans  . LUMBAR FUSION  2015   rod, plates  . r arm surger      Social History   Socioeconomic History  . Marital status: Married    Spouse name: Not on file  . Number of children: 2  . Years of education: Not on file  . Highest education level: Not on file  Occupational History  . Occupation: retired  Scientific laboratory technician  . Financial resource strain: Not on file  . Food insecurity:    Worry: Not on file    Inability: Not on file  . Transportation needs:    Medical: Not on file    Non-medical: Not on file  Tobacco Use  . Smoking status: Former Research scientist (life sciences)  . Smokeless tobacco: Never Used  . Tobacco comment: quit in the 90, light smoker   Substance and Sexual Activity  . Alcohol use: No    Alcohol/week: 0.0  standard drinks  . Drug use: No  . Sexual activity: Not on file  Lifestyle  . Physical activity:    Days per week: Not on file    Minutes per session: Not on file  . Stress: Not on file  Relationships  . Social connections:    Talks on phone: Not on file    Gets together: Not on file    Attends religious service: Not on file    Active member of club or organization: Not on file    Attends meetings of clubs or organizations: Not on file    Relationship status: Not on file  . Intimate partner violence:    Fear of current or ex partner: Not on file    Emotionally abused: Not on file    Physically abused: Not on file    Forced sexual activity: Not on file  Other Topics Concern  . Not on file  Social History Narrative   Household: pt, wife, 2 cats   children 2 and 2 step daughters, has g-kids      Allergies as of 03/26/2018      Reactions   Penicillins Swelling   redness diffusely 1962      Medication List        Accurate as of 03/26/18 11:59  PM. Always use your most recent med list.          HYDROcodone-acetaminophen 10-325 MG tablet Commonly known as:  NORCO Take 1 tablet by mouth every 6 (six) hours as needed.   losartan 50 MG tablet Commonly known as:  COZAAR Take 1 tablet (50 mg total) by mouth daily.   metoprolol tartrate 25 MG tablet Commonly known as:  LOPRESSOR Take 1 tablet (25 mg total) by mouth 2 (two) times daily.   morphine 30 MG 12 hr tablet Commonly known as:  MS CONTIN Take 30 mg by mouth 3 (three) times daily.   multivitamin with minerals Tabs tablet Take 1 tablet by mouth daily.   omeprazole 20 MG capsule Commonly known as:  PRILOSEC Take 1 capsule (20 mg total) by mouth 2 (two) times daily.   ondansetron 4 MG tablet Commonly known as:  ZOFRAN Take 1 tablet (4 mg total) by mouth every 8 (eight) hours as needed for nausea or vomiting.   tizanidine 2 MG capsule Commonly known as:  ZANAFLEX Take 2-4 mg by mouth every 8 (eight) hours as  needed. Not to exceed 3 doses in 24 hours.          Objective:   Physical Exam BP (!) 148/62 (BP Location: Left Arm, Patient Position: Sitting, Cuff Size: Normal)   Pulse 68   Temp 97.8 F (36.6 C) (Oral)   Resp 16   Ht 5\' 10"  (1.778 m)   Wt 215 lb 6 oz (97.7 kg)   SpO2 98%   BMI 30.90 kg/m  General:   Well developed, NAD, see BMI.  HEENT:  Normocephalic . Face symmetric, atraumatic Lungs:  CTA B Normal respiratory effort, no intercostal retractions, no accessory muscle use. Heart: RRR,  no murmur.  No pretibial edema bilaterally  Skin: Not pale. Not jaundice Neurologic:  alert & oriented X3.  Speech normal, gait and transferring limited by pain, uses a cane.    Psych--  Cognition and judgment appear intact.  Cooperative with normal attention span and concentration.  Behavior appropriate. No anxious or depressed appearing.      Assessment & Plan:   Assessment HTN High cholesterol h/o HA-- s/p extensive w/u in the past. Has seen Dr Maureen Chatters and neuro @ [facility]   h/o kidney stones , multiple Chronic back pain-- sees pain management, see surgeries Chronic hoarseness: Never seen by ENT Sees dermatology   PLAN  HTN: Previously history of elevated BP, on beta-blockers, he now has HTN based on multiple recent elevated BP readings.  Other than feeling slightly dizzy he is asymptomatic.  He is already trying to eat a low-salt diet, unable to exercise due to MSK issues. Plan: BMP, continue beta-blockers, add losartan 50 mg, BMP in 2 weeks, monitor BPs. Urolithiasis, hematuria: Patient decided not to pursue urology referral, explained my concern: ? bladder cancer.  He verbalized understanding but again prefers not to be evaluate unless symptoms return. RTC 2 weeks for blood work, CPX by November as already scheduled

## 2018-03-26 NOTE — Progress Notes (Signed)
Pre visit review using our clinic review tool, if applicable. No additional management support is needed unless otherwise documented below in the visit note. 

## 2018-03-26 NOTE — Patient Instructions (Signed)
GO TO THE LAB : Get the blood work     GO TO THE FRONT DESK Schedule your next appointment for a blood check 2 weeks from today.  Continue the same medications  Add losartan 50 mg 1 tablet daily for blood pressure  Watch your salt intake  Check the  blood pressure 2 or 3 times   week   Be sure your blood pressure is between 110/65 and  135/85. If it is consistently higher or lower, let me know

## 2018-03-27 LAB — BASIC METABOLIC PANEL
BUN: 16 mg/dL (ref 6–23)
CO2: 27 mEq/L (ref 19–32)
Calcium: 9.2 mg/dL (ref 8.4–10.5)
Chloride: 102 mEq/L (ref 96–112)
Creatinine, Ser: 0.99 mg/dL (ref 0.40–1.50)
GFR: 78.1 mL/min (ref >60.00)
Glucose, Bld: 112 mg/dL — ABNORMAL HIGH (ref 70–99)
Potassium: 4.3 mEq/L (ref 3.5–5.1)
Sodium: 138 mEq/L (ref 135–145)

## 2018-03-27 NOTE — Assessment & Plan Note (Signed)
HTN: Previously history of elevated BP, on beta-blockers, he now has HTN based on multiple recent elevated BP readings.  Other than feeling slightly dizzy he is asymptomatic.  He is already trying to eat a low-salt diet, unable to exercise due to MSK issues. Plan: BMP, continue beta-blockers, add losartan 50 mg, BMP in 2 weeks, monitor BPs. Urolithiasis, hematuria: Patient decided not to pursue urology referral, explained my concern: ? bladder cancer.  He verbalized understanding but again prefers not to be evaluate unless symptoms return. RTC 2 weeks for blood work, CPX by November as already scheduled

## 2018-04-09 ENCOUNTER — Other Ambulatory Visit (INDEPENDENT_AMBULATORY_CARE_PROVIDER_SITE_OTHER): Payer: PPO

## 2018-04-09 DIAGNOSIS — R030 Elevated blood-pressure reading, without diagnosis of hypertension: Secondary | ICD-10-CM | POA: Diagnosis not present

## 2018-04-09 LAB — BASIC METABOLIC PANEL
BUN: 14 mg/dL (ref 6–23)
CO2: 28 mEq/L (ref 19–32)
Calcium: 9.7 mg/dL (ref 8.4–10.5)
Chloride: 102 mEq/L (ref 96–112)
Creatinine, Ser: 0.85 mg/dL (ref 0.40–1.50)
GFR: 93.11 mL/min (ref >60.00)
Glucose, Bld: 110 mg/dL — ABNORMAL HIGH (ref 70–99)
Potassium: 4.3 mEq/L (ref 3.5–5.1)
Sodium: 140 mEq/L (ref 135–145)

## 2018-04-18 ENCOUNTER — Other Ambulatory Visit: Payer: Self-pay | Admitting: Internal Medicine

## 2018-04-30 ENCOUNTER — Ambulatory Visit (INDEPENDENT_AMBULATORY_CARE_PROVIDER_SITE_OTHER): Payer: PPO | Admitting: Internal Medicine

## 2018-04-30 ENCOUNTER — Encounter: Payer: Self-pay | Admitting: Internal Medicine

## 2018-04-30 VITALS — BP 134/88 | HR 74 | Temp 98.1°F | Resp 16 | Ht 70.0 in | Wt 219.5 lb

## 2018-04-30 DIAGNOSIS — Z0000 Encounter for general adult medical examination without abnormal findings: Secondary | ICD-10-CM | POA: Diagnosis not present

## 2018-04-30 NOTE — Assessment & Plan Note (Addendum)
-  Td 2016; pnm 23: 2011; Prevnar 2016, had a flu shot ; shingrix discussed last year , did not get it ($$) -CCS:  Never had a colonoscopy, not interested  -Prostate cancer screening: DRE -PSA wnl 06-2017 done as w/u for hematuria; not interested in screening  -Diet: Discussed -Exercise :very limited   -Labs: LFTs, FLP, CBC, A1c - EKG: Sinus rhythm, looks different than previous, R BBB, T wave inversions noted in V2 V3.  Patient is symptomatic.  Will discuss with cardiology.

## 2018-04-30 NOTE — Progress Notes (Signed)
Pre visit review using our clinic review tool, if applicable. No additional management support is needed unless otherwise documented below in the visit note. 

## 2018-04-30 NOTE — Patient Instructions (Signed)
GO TO THE LAB : Get the blood work     GO TO THE FRONT DESK Schedule your next appointment for a  Check up in 6 months   

## 2018-04-30 NOTE — Progress Notes (Signed)
Subjective:    Patient ID: Bradley Navarro, male    DOB: 1940-06-23, 77 y.o.   MRN: [id number]  DOS:  04/30/2018 Type of visit - description : cpx Interval history: No major concerns    Review of Systems Reports no issues other than on chronic back pain  Other than above, a 14 point review of systems is negative    Past Medical History:  Diagnosis Date  . Chronic back pain   . Elevated BP without diagnosis of hypertension 04/24/2017  . History of headache    frontal lobe cluster headaches  . History of kidney stones   . Post laminectomy syndrome   . Sciatica of right side 2016   S/P L5-S1 revision lam/PSF on 04-10-12 w/ relief of RLE pain post op, MRI 01/2014 shows minimal foraminal stenosis at L4-5 w/o recurrent stenosis at L5-S1    Past Surgical History:  Procedure Laterality Date  . APPENDECTOMY    . LUMBAR DISC SURGERY  2012   L-5, Dr Vennie Homans  . LUMBAR FUSION  2015   rod, plates  . r arm surger      Social History   Socioeconomic History  . Marital status: Married    Spouse name: Not on file  . Number of children: 2  . Years of education: Not on file  . Highest education level: Not on file  Occupational History  . Occupation: retired  Scientific laboratory technician  . Financial resource strain: Not on file  . Food insecurity:    Worry: Not on file    Inability: Not on file  . Transportation needs:    Medical: Not on file    Non-medical: Not on file  Tobacco Use  . Smoking status: Former Research scientist (life sciences)  . Smokeless tobacco: Never Used  . Tobacco comment: quit in the 90, light smoker   Substance and Sexual Activity  . Alcohol use: No    Alcohol/week: 0.0 standard drinks  . Drug use: No  . Sexual activity: Not on file  Lifestyle  . Physical activity:    Days per week: Not on file    Minutes per session: Not on file  . Stress: Not on file  Relationships  . Social connections:    Talks on phone: Not on file    Gets together: Not on file    Attends religious service:  Not on file    Active member of club or organization: Not on file    Attends meetings of clubs or organizations: Not on file    Relationship status: Not on file  . Intimate partner violence:    Fear of current or ex partner: Not on file    Emotionally abused: Not on file    Physically abused: Not on file    Forced sexual activity: Not on file  Other Topics Concern  . Not on file  Social History Narrative   Household: pt, wife, 2 cats   children 2 and 2 step daughters, has g-kids     Family History  Problem Relation Age of Onset  . CAD Father 62       MI   . Dementia Mother   . Colon cancer Neg Hx   . Prostate cancer Neg Hx   . Diabetes Neg Hx      Allergies as of 04/30/2018      Reactions   Penicillins Swelling   redness diffusely 1962      Medication List  Accurate as of 04/30/18 11:59 PM. Always use your most recent med list.          HYDROcodone-acetaminophen 10-325 MG tablet Commonly known as:  NORCO Take 1 tablet by mouth every 6 (six) hours as needed.   losartan 25 MG tablet Commonly known as:  COZAAR TAKE 2 TABLETS BY MOUTH EVERY DAY   metoprolol tartrate 25 MG tablet Commonly known as:  LOPRESSOR Take 1 tablet (25 mg total) by mouth 2 (two) times daily.   morphine 30 MG 12 hr tablet Commonly known as:  MS CONTIN Take 30 mg by mouth 3 (three) times daily.   multivitamin with minerals Tabs tablet Take 1 tablet by mouth daily.   omeprazole 20 MG capsule Commonly known as:  PRILOSEC Take 1 capsule (20 mg total) by mouth 2 (two) times daily.   ondansetron 4 MG tablet Commonly known as:  ZOFRAN Take 1 tablet (4 mg total) by mouth every 8 (eight) hours as needed for nausea or vomiting.   tizanidine 2 MG capsule Commonly known as:  ZANAFLEX Take 2-4 mg by mouth every 8 (eight) hours as needed. Not to exceed 3 doses in 24 hours.          Objective:   Physical Exam BP 134/88 (BP Location: Left Arm, Patient Position: Sitting, Cuff Size:  Small)   Pulse 74   Temp 98.1 F (36.7 C) (Oral)   Resp 16   Ht 5\' 10"  (1.778 m)   Wt 219 lb 8 oz (99.6 kg)   SpO2 96%   BMI 31.49 kg/m  General: Well developed, NAD, BMI noted Neck: No  thyromegaly  HEENT:  Normocephalic . Face symmetric, atraumatic Lungs:  CTA B Normal respiratory effort, no intercostal retractions, no accessory muscle use. Heart: RRR,  no murmur.  No pretibial edema bilaterally  Abdomen:  Not distended, soft, non-tender. No rebound or rigidity.   Skin: Exposed areas without rash. Not pale. Not jaundice Neurologic:  alert & oriented X3.  Speech normal, gait and posture antalgic due to back pain.  Moves very slow Strength symmetric and appropriate for age.  Psych: Cognition and judgment appear intact.  Cooperative with normal attention span and concentration.  Behavior appropriate. No anxious or depressed appearing.     Assessment & Plan:   Assessment HTN High cholesterol h/o HA-- s/p extensive w/u in the past. Has seen Dr Maureen Chatters and neuro @ [facility]   h/o kidney stones , multiple Chronic back pain-- sees pain management, see surgeries Chronic hoarseness: Never seen by ENT Sees dermatology   PLAN  HTN: Well-controlled on losartan, metoprolol, checking labs High cholesterol: Diet controlled.  He is high risk for cardiovascular events mostly due to age, we talk about statins for primary prevention, he is receptive to the idea, potential s/e discussed, checking labs  Chronic back pain: Under pain management. He continue to see dermatology regularly RTC 6 months

## 2018-05-08 ENCOUNTER — Telehealth: Payer: Self-pay | Admitting: Internal Medicine

## 2018-05-08 DIAGNOSIS — R9431 Abnormal electrocardiogram [ECG] [EKG]: Secondary | ICD-10-CM

## 2018-05-08 NOTE — Assessment & Plan Note (Signed)
HTN: Well-controlled on losartan, metoprolol, checking labs High cholesterol: Diet controlled.  He is high risk for cardiovascular events mostly due to age, we talk about statins for primary prevention, he is receptive to the idea, potential s/e discussed, checking labs  Chronic back pain: Under pain management. He continue to see dermatology regularly RTC 6 months

## 2018-05-08 NOTE — Telephone Encounter (Signed)
EKG done during the physical exam few days ago discussed with cardiology, he has some T wave inversions and RBBB. They recommend to discontinue beta-blockers, increase ARBs, check a echo. Spoke with the patient, will decrease metoprolol to 1 tablet daily x10 days then stop. Kaylyn, -Call losartan 100 mg 1 tablet daily #30 and 2 refills -Schedule office visit with me 2 weeks from today -Order echocardiogram, DX abnormal EKG.

## 2018-05-09 ENCOUNTER — Telehealth: Payer: Self-pay

## 2018-05-09 MED ORDER — LOSARTAN POTASSIUM 100 MG PO TABS: 100.0000 mg | ORAL_TABLET | Freq: Every day | ORAL | 2 refills | Status: DC

## 2018-05-09 NOTE — Telephone Encounter (Signed)
Attempted to call pt, however, pt does not have a voicemail. Will attempt to call again later.

## 2018-05-09 NOTE — Telephone Encounter (Signed)
Tried calling Pt- pick and hang up.

## 2018-05-09 NOTE — Telephone Encounter (Signed)
Received message from [facility] refusing echo because nothing was discussed about him having one- he also does not have money to complete as well.

## 2018-05-09 NOTE — Telephone Encounter (Signed)
I talked to him yesterday about the plan, my recollection is that I did mention a echocardiogram; if I did not, I apologize. Please advise him that cardiology recommends a echo, if unable to pursue we will have to talk more when he comes back

## 2018-05-09 NOTE — Telephone Encounter (Signed)
-  Losartan 100mg  sent to [facility], echo ordered. Shanea or Bridgett- can you call Pt to schedule appt w/ PCP for 2 weeks. Thank you.

## 2018-05-10 NOTE — Telephone Encounter (Signed)
Okay, proceed with medication changes as recommended, hold echo, come back to the office in a couple of weeks for a visit.  Will decide what is next at that time

## 2018-05-10 NOTE — Telephone Encounter (Signed)
Pt. Returned call, and author explained cardiology's recommendation for echo. Pt. stated "I'll do it if I absolutely have to, but I really need to know if this is necessary, because I can't afford much right now". Routed to Dr. Larose Kells to advise on best course of action. Pt. Also made aware that losartan had been sent to pharmacy, and pt. Appreciative.

## 2018-05-10 NOTE — Telephone Encounter (Signed)
Author phoned pt. To relay Dr. Ethel Rana message. F/U appointment made for 12/12 at 9AM. No other concerns at this time.

## 2018-05-25 DIAGNOSIS — Z6831 Body mass index (BMI) 31.0-31.9, adult: Secondary | ICD-10-CM | POA: Diagnosis not present

## 2018-05-25 DIAGNOSIS — M5431 Sciatica, right side: Secondary | ICD-10-CM | POA: Diagnosis not present

## 2018-05-31 ENCOUNTER — Ambulatory Visit (INDEPENDENT_AMBULATORY_CARE_PROVIDER_SITE_OTHER): Payer: PPO | Admitting: Internal Medicine

## 2018-05-31 ENCOUNTER — Encounter: Payer: Self-pay | Admitting: Internal Medicine

## 2018-05-31 VITALS — BP 136/78 | HR 75 | Temp 98.5°F | Resp 16 | Ht 70.0 in | Wt 219.5 lb

## 2018-05-31 DIAGNOSIS — R9431 Abnormal electrocardiogram [ECG] [EKG]: Secondary | ICD-10-CM | POA: Diagnosis not present

## 2018-05-31 DIAGNOSIS — I10 Essential (primary) hypertension: Secondary | ICD-10-CM | POA: Diagnosis not present

## 2018-05-31 DIAGNOSIS — Z0000 Encounter for general adult medical examination without abnormal findings: Secondary | ICD-10-CM

## 2018-05-31 DIAGNOSIS — E785 Hyperlipidemia, unspecified: Secondary | ICD-10-CM

## 2018-05-31 LAB — BASIC METABOLIC PANEL
BUN: 14 mg/dL (ref 6–23)
CO2: 31 mEq/L (ref 19–32)
Calcium: 9.5 mg/dL (ref 8.4–10.5)
Chloride: 103 mEq/L (ref 96–112)
Creatinine, Ser: 0.78 mg/dL (ref 0.40–1.50)
GFR: 102.78 mL/min (ref >60.00)
Glucose, Bld: 129 mg/dL — ABNORMAL HIGH (ref 70–99)
Potassium: 4.4 mEq/L (ref 3.5–5.1)
Sodium: 139 mEq/L (ref 135–145)

## 2018-05-31 LAB — CBC WITH DIFFERENTIAL/PLATELET
Basophils Absolute: 0 10*3/uL (ref 0.0–0.1)
Basophils Relative: 0.6 % (ref 0.0–3.0)
Eosinophils Absolute: 0.1 10*3/uL (ref 0.0–0.7)
Eosinophils Relative: 1.8 % (ref 0.0–5.0)
HCT: 42.5 % (ref 39.0–52.0)
Hemoglobin: 14.8 g/dL (ref 13.0–17.0)
Lymphocytes Relative: 32.2 % (ref 12.0–46.0)
Lymphs Abs: 1.8 10*3/uL (ref 0.7–4.0)
MCHC: 34.9 g/dL (ref 30.0–36.0)
MCV: 93.8 fl (ref 78.0–100.0)
Monocytes Absolute: 0.5 10*3/uL (ref 0.1–1.0)
Monocytes Relative: 8.4 % (ref 3.0–12.0)
Neutro Abs: 3.2 10*3/uL (ref 1.4–7.7)
Neutrophils Relative %: 57 % (ref 43.0–77.0)
Platelets: 212 10*3/uL (ref 150.0–400.0)
RBC: 4.53 Mil/uL (ref 4.22–5.81)
RDW: 12.8 % (ref 11.5–15.5)
WBC: 5.6 10*3/uL (ref 4.0–10.5)

## 2018-05-31 LAB — LIPID PANEL
Cholesterol: 196 mg/dL (ref 0–200)
HDL: 30.4 mg/dL — ABNORMAL LOW (ref >39.00)
NonHDL: 165.23
Total CHOL/HDL Ratio: 6
Triglycerides: 317 mg/dL — ABNORMAL HIGH (ref 0.0–149.0)
VLDL: 63.4 mg/dL — ABNORMAL HIGH (ref 0.0–40.0)

## 2018-05-31 LAB — LDL CHOLESTEROL, DIRECT: Direct LDL: 127 mg/dL

## 2018-05-31 LAB — ALT: ALT: 15 U/L (ref 0–53)

## 2018-05-31 LAB — HEMOGLOBIN A1C: Hgb A1c MFr Bld: 5.9 % (ref 4.6–6.5)

## 2018-05-31 LAB — AST: AST: 18 U/L (ref 0–37)

## 2018-05-31 NOTE — Progress Notes (Signed)
Pre visit review using our clinic review tool, if applicable. No additional management support is needed unless otherwise documented below in the visit note. 

## 2018-05-31 NOTE — Progress Notes (Signed)
Subjective:    Patient ID: Bradley Navarro, male    DOB: 01/17/1941, 77 y.o.   MRN: [id number]  DOS:  05/31/2018 Type of visit - description : Follow-up Since the last office visit, his EKG was a slightly abnormal, cardiology was consulted informally, they recommend to stop beta-blockers and start losartan. Echo was also recommended but patient is quite reluctant. He already stop beta-blockers, he feels well.   Review of Systems  Denies chest pain no difficulty breathing No lower extremity edema.  No DOE. Ambulatory BPs within normal  Past Medical History:  Diagnosis Date  . Chronic back pain   . Elevated BP without diagnosis of hypertension 04/24/2017  . History of headache    frontal lobe cluster headaches  . History of kidney stones   . Post laminectomy syndrome   . Sciatica of right side 2016   S/P L5-S1 revision lam/PSF on 04-10-12 w/ relief of RLE pain post op, MRI 01/2014 shows minimal foraminal stenosis at L4-5 w/o recurrent stenosis at L5-S1    Past Surgical History:  Procedure Laterality Date  . APPENDECTOMY    . LUMBAR DISC SURGERY  2012   L-5, Dr Vennie Homans  . LUMBAR FUSION  2015   rod, plates  . r arm surger      Social History   Socioeconomic History  . Marital status: Married    Spouse name: Not on file  . Number of children: 2  . Years of education: Not on file  . Highest education level: Not on file  Occupational History  . Occupation: retired  Scientific laboratory technician  . Financial resource strain: Not on file  . Food insecurity:    Worry: Not on file    Inability: Not on file  . Transportation needs:    Medical: Not on file    Non-medical: Not on file  Tobacco Use  . Smoking status: Former Research scientist (life sciences)  . Smokeless tobacco: Never Used  . Tobacco comment: quit in the 90, light smoker   Substance and Sexual Activity  . Alcohol use: No    Alcohol/week: 0.0 standard drinks  . Drug use: No  . Sexual activity: Not on file  Lifestyle  . Physical activity:    Days per week: Not on file    Minutes per session: Not on file  . Stress: Not on file  Relationships  . Social connections:    Talks on phone: Not on file    Gets together: Not on file    Attends religious service: Not on file    Active member of club or organization: Not on file    Attends meetings of clubs or organizations: Not on file    Relationship status: Not on file  . Intimate partner violence:    Fear of current or ex partner: Not on file    Emotionally abused: Not on file    Physically abused: Not on file    Forced sexual activity: Not on file  Other Topics Concern  . Not on file  Social History Narrative   Household: pt, wife, 2 cats   children 2 and 2 step daughters, has g-kids      Allergies as of 05/31/2018      Reactions   Penicillins Swelling   redness diffusely 1962      Medication List       Accurate as of May 31, 2018  9:18 AM. Always use your most recent med list.  HYDROcodone-acetaminophen 10-325 MG tablet Commonly known as:  NORCO Take 1 tablet by mouth every 6 (six) hours as needed.   losartan 100 MG tablet Commonly known as:  COZAAR Take 1 tablet (100 mg total) by mouth daily.   metoprolol tartrate 25 MG tablet Commonly known as:  LOPRESSOR Take 1 tablet (25 mg total) by mouth 2 (two) times daily.   morphine 30 MG 12 hr tablet Commonly known as:  MS CONTIN Take 30 mg by mouth 3 (three) times daily.   multivitamin with minerals Tabs tablet Take 1 tablet by mouth daily.   omeprazole 20 MG capsule Commonly known as:  PRILOSEC Take 1 capsule (20 mg total) by mouth 2 (two) times daily.   ondansetron 4 MG tablet Commonly known as:  ZOFRAN Take 1 tablet (4 mg total) by mouth every 8 (eight) hours as needed for nausea or vomiting.   tizanidine 2 MG capsule Commonly known as:  ZANAFLEX Take 2-4 mg by mouth every 8 (eight) hours as needed. Not to exceed 3 doses in 24 hours.           Objective:   Physical Exam BP  136/78 (BP Location: Left Arm, Patient Position: Sitting, Cuff Size: Normal)   Pulse 75   Temp 98.5 F (36.9 C) (Oral)   Resp 16   Ht 5\' 10"  (1.778 m)   Wt 219 lb 8 oz (99.6 kg)   SpO2 96%   BMI 31.49 kg/m  General:   Well developed, NAD, BMI noted. HEENT:  Normocephalic . Face symmetric, atraumatic Lungs:  CTA B Normal respiratory effort, no intercostal retractions, no accessory muscle use. Heart: RRR,  no murmur.  No pretibial edema bilaterally  Skin: Not pale. Not jaundice Neurologic:  alert & oriented X3.  Speech normal, gait appropriate for age and unassisted Psych--  Cognition and judgment appear intact.  Cooperative with normal attention span and concentration.  Behavior appropriate. No anxious or depressed appearing.      Assessment & Plan:    Assessment HTN High cholesterol h/o HA-- s/p extensive w/u in the past. Has seen Dr Maureen Chatters and neuro @ [facility]   h/o kidney stones , multiple Chronic back pain-- sees pain management, see surgeries Chronic hoarseness: Never seen by ENT Sees dermatology   PLAN Abnormal EKG: some TWI, RBBB.  Cards rec a echo,  d/c beta-blockers and rx  Losartan which the patient is doing, he is feeling well. ASX EKG today: Unchanged from 04/30/2018. Echocardiogram is very expensive for the patient and he told me he would do it only if "absolutely necessary".  That is a very difficult question to answer, at this point I offered him the option to see cardiology and discussed with them versus observations since he is asymptomatic and EKG is a stable. pt elected obs. HTN: As above,Currently on losartan, check a BMP. We will also do all the blood work that we ordered at the previous visit.(not fasting) RTC as scheduled May 2020.Marland Kitchen

## 2018-05-31 NOTE — Patient Instructions (Addendum)
  Please schedule Medicare Wellness with Glenard Haring.   GO TO THE LAB : Get the blood work     Check the  blood pressure 2 or 3 times a month  Be sure your blood pressure is between 110/65 and  135/85. If it is consistently higher or lower, let me know

## 2018-05-31 NOTE — Addendum Note (Signed)
Addended byDamita Dunnings D on: 05/31/2018 09:34 AM   Modules accepted: Orders

## 2018-06-01 NOTE — Assessment & Plan Note (Signed)
Abnormal EKG: some TWI, RBBB.  Cards rec a echo,  d/c beta-blockers and rx  Losartan which the patient is doing, he is feeling well. ASX EKG today: Unchanged from 04/30/2018. Echocardiogram is very expensive for the patient and he told me he would do it only if "absolutely necessary".  That is a very difficult question to answer, at this point I offered him the option to see cardiology and discussed with them versus observations since he is asymptomatic and EKG is a stable. pt elected obs. HTN: As above,Currently on losartan, check a BMP. We will also do all the blood work that we ordered at the previous visit.(not fasting) RTC as scheduled May 2020.Marland Kitchen

## 2018-06-04 MED ORDER — ATORVASTATIN CALCIUM 10 MG PO TABS: 10.0000 mg | ORAL_TABLET | Freq: Every day | ORAL | 3 refills | Status: DC

## 2018-06-04 NOTE — Addendum Note (Signed)
Addended byDamita Dunnings D on: 06/04/2018 11:28 AM   Modules accepted: Orders

## 2018-07-16 ENCOUNTER — Other Ambulatory Visit (INDEPENDENT_AMBULATORY_CARE_PROVIDER_SITE_OTHER): Payer: PPO

## 2018-07-16 DIAGNOSIS — E785 Hyperlipidemia, unspecified: Secondary | ICD-10-CM

## 2018-07-16 LAB — ALT: ALT: 14 U/L (ref 0–53)

## 2018-07-16 LAB — LIPID PANEL
Cholesterol: 145 mg/dL (ref 0–200)
HDL: 35.7 mg/dL — ABNORMAL LOW (ref >39.00)
NonHDL: 108.95
Total CHOL/HDL Ratio: 4
Triglycerides: 203 mg/dL — ABNORMAL HIGH (ref 0.0–149.0)
VLDL: 40.6 mg/dL — ABNORMAL HIGH (ref 0.0–40.0)

## 2018-07-16 LAB — LDL CHOLESTEROL, DIRECT: Direct LDL: 85 mg/dL

## 2018-07-16 LAB — AST: AST: 15 U/L (ref 0–37)

## 2018-07-18 MED ORDER — ATORVASTATIN CALCIUM 10 MG PO TABS: 10.0000 mg | ORAL_TABLET | Freq: Every day | ORAL | 3 refills | Status: DC

## 2018-07-18 NOTE — Addendum Note (Signed)
Addended byDamita Dunnings D on: 07/18/2018 07:33 AM   Modules accepted: Orders

## 2018-07-20 DIAGNOSIS — M5431 Sciatica, right side: Secondary | ICD-10-CM | POA: Diagnosis not present

## 2018-07-20 DIAGNOSIS — Z6831 Body mass index (BMI) 31.0-31.9, adult: Secondary | ICD-10-CM | POA: Diagnosis not present

## 2018-08-01 ENCOUNTER — Other Ambulatory Visit: Payer: Self-pay | Admitting: Internal Medicine

## 2018-09-19 DIAGNOSIS — M545 Low back pain: Secondary | ICD-10-CM | POA: Diagnosis not present

## 2018-09-19 DIAGNOSIS — M961 Postlaminectomy syndrome, not elsewhere classified: Secondary | ICD-10-CM | POA: Diagnosis not present

## 2018-09-19 DIAGNOSIS — M5431 Sciatica, right side: Secondary | ICD-10-CM | POA: Diagnosis not present

## 2018-10-12 DIAGNOSIS — M5431 Sciatica, right side: Secondary | ICD-10-CM | POA: Diagnosis not present

## 2018-10-29 ENCOUNTER — Ambulatory Visit (INDEPENDENT_AMBULATORY_CARE_PROVIDER_SITE_OTHER): Payer: PPO | Admitting: Internal Medicine

## 2018-10-29 ENCOUNTER — Other Ambulatory Visit: Payer: Self-pay

## 2018-10-29 DIAGNOSIS — I10 Essential (primary) hypertension: Secondary | ICD-10-CM | POA: Diagnosis not present

## 2018-10-29 DIAGNOSIS — E7800 Pure hypercholesterolemia, unspecified: Secondary | ICD-10-CM | POA: Diagnosis not present

## 2018-10-29 DIAGNOSIS — R739 Hyperglycemia, unspecified: Secondary | ICD-10-CM

## 2018-10-29 NOTE — Progress Notes (Signed)
Subjective:    Patient ID: Bradley Navarro, male    DOB: [birth year], 78 y.o.   MRN: [id number]  DOS:  10/29/2018 Type of visit - description: Virtual Visit via Video Note  I connected with@ on 10/30/18 at 11:00 AM EDT by a video enabled telemedicine application and verified that I am speaking with the correct person using two identifiers.   THIS ENCOUNTER IS A VIRTUAL VISIT DUE TO COVID-19 - PATIENT WAS NOT SEEN IN THE OFFICE. PATIENT HAS CONSENTED TO VIRTUAL VISIT / TELEMEDICINE VISIT   Location of patient: home  Location of provider: office  I discussed the limitations of evaluation and management by telemedicine and the availability of in person appointments. The patient expressed understanding and agreed to proceed.  History of Present Illness: Follow-up Since the last office visit, he reports he is doing very well. We reviewed together he is medication least, recent labs.  Also he is ambulatory BPs. COVID-19: Doing well with quarantine, basically, has not been out of his house.   Review of Systems  Denies fever chills No cough No nausea, vomiting, diarrhea Denies chest pain, difficulty breathing.  No lower extremity edema or palpitations  Past Medical History:  Diagnosis Date  . Chronic back pain   . Elevated BP without diagnosis of hypertension 04/24/2017  . History of headache    frontal lobe cluster headaches  . History of kidney stones   . Post laminectomy syndrome   . Sciatica of right side 2016   S/P L5-S1 revision lam/PSF on 04-10-12 w/ relief of RLE pain post op, MRI 01/2014 shows minimal foraminal stenosis at L4-5 w/o recurrent stenosis at L5-S1    Past Surgical History:  Procedure Laterality Date  . APPENDECTOMY    . LUMBAR DISC SURGERY  2012   L-5, Dr Vennie Homans  . LUMBAR FUSION  2015   rod, plates  . r arm surger      Social History   Socioeconomic History  . Marital status: Married    Spouse name: Not on file  . Number of children: 2  . Years  of education: Not on file  . Highest education level: Not on file  Occupational History  . Occupation: retired  Scientific laboratory technician  . Financial resource strain: Not on file  . Food insecurity:    Worry: Not on file    Inability: Not on file  . Transportation needs:    Medical: Not on file    Non-medical: Not on file  Tobacco Use  . Smoking status: Former Research scientist (life sciences)  . Smokeless tobacco: Never Used  . Tobacco comment: quit in the 90, light smoker   Substance and Sexual Activity  . Alcohol use: No    Alcohol/week: 0.0 standard drinks  . Drug use: No  . Sexual activity: Not on file  Lifestyle  . Physical activity:    Days per week: Not on file    Minutes per session: Not on file  . Stress: Not on file  Relationships  . Social connections:    Talks on phone: Not on file    Gets together: Not on file    Attends religious service: Not on file    Active member of club or organization: Not on file    Attends meetings of clubs or organizations: Not on file    Relationship status: Not on file  . Intimate partner violence:    Fear of current or ex partner: Not on file    Emotionally abused: Not  on file    Physically abused: Not on file    Forced sexual activity: Not on file  Other Topics Concern  . Not on file  Social History Narrative   Household: pt, wife, 2 cats   children 2 and 2 step daughters, has g-kids      Allergies as of 10/29/2018      Reactions   Penicillins Swelling   redness diffusely 1962      Medication List       Accurate as of Oct 29, 2018 11:59 PM. If you have any questions, ask your nurse or doctor.        atorvastatin 10 MG tablet Commonly known as:  LIPITOR Take 1 tablet (10 mg total) by mouth at bedtime.   HYDROcodone-acetaminophen 10-325 MG tablet Commonly known as:  NORCO Take 1 tablet by mouth every 6 (six) hours as needed.   losartan 100 MG tablet Commonly known as:  COZAAR Take 1 tablet (100 mg total) by mouth daily.   morphine 30 MG 12 hr  tablet Commonly known as:  MS CONTIN Take 30 mg by mouth 3 (three) times daily.   multivitamin with minerals Tabs tablet Take 1 tablet by mouth daily.   omeprazole 20 MG capsule Commonly known as:  PRILOSEC Take 1 capsule (20 mg total) by mouth 2 (two) times daily.   ondansetron 4 MG tablet Commonly known as:  ZOFRAN Take 1 tablet (4 mg total) by mouth every 8 (eight) hours as needed for nausea or vomiting.   tizanidine 2 MG capsule Commonly known as:  ZANAFLEX Take 2-4 mg by mouth every 8 (eight) hours as needed. Not to exceed 3 doses in 24 hours.           Objective:   Physical Exam There were no vitals taken for this visit. This is a virtual phone visit, alert oriented x3, no apparent distress, speaking in complete sentences    Assessment     Assessment Hyperglycemia (A1c 5.28 May 2018) HTN High cholesterol h/o HA-- s/p extensive w/u in the past. Has seen Dr Maureen Chatters and neuro @ [facility]   h/o kidney stones , multiple Chronic back pain-- sees pain management, see surgeries Chronic hoarseness: Never seen by ENT Sees dermatology   PLAN Hyperglycemia: A1c 5.9 on 05/31/2018. HTN: Currently on losartan, subsequent BMP was very good, ambulatory BPs, 120, 130.  Recommend no change High cholesterol: Started Lipitor in December, initial LDL 127, subsequently it was 85 with normal LFTs.  No change. Abnormal EKG: See previous entries, he is completely asymptomatic, observation. RTC 6 months, will call and set up.        I discussed the assessment and treatment plan with the patient. The patient was provided an opportunity to ask questions and all were answered. The patient agreed with the plan and demonstrated an understanding of the instructions.   The patient was advised to call back or seek an in-person evaluation if the symptoms worsen or if the condition fails to improve as anticipated.  I provided 15 minutes of non-face-to-face time during this encounter.  Kathlene November, MD

## 2018-10-30 DIAGNOSIS — E119 Type 2 diabetes mellitus without complications: Secondary | ICD-10-CM | POA: Insufficient documentation

## 2018-10-30 DIAGNOSIS — E7800 Pure hypercholesterolemia, unspecified: Secondary | ICD-10-CM | POA: Insufficient documentation

## 2018-10-30 DIAGNOSIS — R739 Hyperglycemia, unspecified: Secondary | ICD-10-CM | POA: Insufficient documentation

## 2018-10-30 NOTE — Assessment & Plan Note (Signed)
Hyperglycemia: A1c 5.9 on 05/31/2018. HTN: Currently on losartan, subsequent BMP was very good, ambulatory BPs, 120, 130.  Recommend no change High cholesterol: Started Lipitor in December, initial LDL 127, subsequently it was 85 with normal LFTs.  No change. Abnormal EKG: See previous entries, he is completely asymptomatic, observation. RTC 6 months, will call and set up.

## 2018-12-14 DIAGNOSIS — M545 Low back pain: Secondary | ICD-10-CM | POA: Diagnosis not present

## 2018-12-14 DIAGNOSIS — M5431 Sciatica, right side: Secondary | ICD-10-CM | POA: Diagnosis not present

## 2018-12-18 ENCOUNTER — Other Ambulatory Visit: Payer: Self-pay | Admitting: Rehabilitation

## 2018-12-18 DIAGNOSIS — M5431 Sciatica, right side: Secondary | ICD-10-CM

## 2019-01-15 ENCOUNTER — Other Ambulatory Visit: Payer: PPO

## 2019-01-27 ENCOUNTER — Other Ambulatory Visit: Payer: Self-pay | Admitting: Internal Medicine

## 2019-02-07 ENCOUNTER — Ambulatory Visit (INDEPENDENT_AMBULATORY_CARE_PROVIDER_SITE_OTHER): Payer: PPO | Admitting: Internal Medicine

## 2019-02-07 ENCOUNTER — Telehealth: Payer: Self-pay

## 2019-02-07 ENCOUNTER — Other Ambulatory Visit: Payer: Self-pay

## 2019-02-07 ENCOUNTER — Encounter: Payer: Self-pay | Admitting: Internal Medicine

## 2019-02-07 VITALS — BP 125/73 | HR 100 | Temp 97.0°F | Resp 16 | Ht 70.0 in | Wt 218.1 lb

## 2019-02-07 DIAGNOSIS — R319 Hematuria, unspecified: Secondary | ICD-10-CM | POA: Diagnosis not present

## 2019-02-07 DIAGNOSIS — N390 Urinary tract infection, site not specified: Secondary | ICD-10-CM | POA: Diagnosis not present

## 2019-02-07 LAB — POC URINALSYSI DIPSTICK (AUTOMATED)
Bilirubin, UA: NEGATIVE
Glucose, UA: NEGATIVE
Ketones, UA: NEGATIVE
Nitrite, UA: NEGATIVE
Protein, UA: POSITIVE — AB
Spec Grav, UA: <=1.005 — AB (ref 1.010–1.025)
Urobilinogen, UA: 0.2 E.U./dL
pH, UA: 6 (ref 5.0–8.0)

## 2019-02-07 MED ORDER — SULFAMETHOXAZOLE-TRIMETHOPRIM 400-80 MG PO TABS: 1.0000 | ORAL_TABLET | Freq: Two times a day (BID) | ORAL | 0 refills | Status: DC

## 2019-02-07 NOTE — Telephone Encounter (Signed)
Copied from [place]. Topic: General - Other >> Feb 07, 2019  8:07 AM Keene Breath wrote: Reason for CRM: Patient stated that he has a kidney infection and needs some medication sent to pharmacy.   Patient said it started yesterday.  Please call if there are any other questions.  CB# [phone]

## 2019-02-07 NOTE — Telephone Encounter (Signed)
Needs appt please

## 2019-02-07 NOTE — Patient Instructions (Addendum)
Drink plenty of fluids  Start taking the antibiotic twice a day for 2 weeks  Call if not gradually better  Call if fever chills, severe stomach pain

## 2019-02-07 NOTE — Progress Notes (Signed)
Subjective:    Patient ID: Bradley Navarro, male    DOB: [birth year], 78 y.o.   MRN: [id number]  DOS:  02/07/2019 Type of visit - description: Acute visit (converted from virtual to in person visit) Symptoms started yesterday with urinary frequency "every 2 minutes". + Dysuria He has been straining his urine and has not seen stones but he saw "a bunch of clear pus", mucus?  Denies penile discharge per se, the mucus/pus is only when he urinates.  He has been able to drink plenty of fluids.    Review of Systems No fever chills No chest pain no difficulty breathing Mild nausea.  Denies vomiting, diarrhea.  Appetite is very poor, has not eaten today but is drinking plenty of fluids. No flank pain. Some suprapubic discomfort before each urination.  Past Medical History:  Diagnosis Date  . Chronic back pain   . Elevated BP without diagnosis of hypertension 04/24/2017  . History of headache    frontal lobe cluster headaches  . History of kidney stones   . Post laminectomy syndrome   . Sciatica of right side 2016   S/P L5-S1 revision lam/PSF on 04-10-12 w/ relief of RLE pain post op, MRI 01/2014 shows minimal foraminal stenosis at L4-5 w/o recurrent stenosis at L5-S1    Past Surgical History:  Procedure Laterality Date  . APPENDECTOMY    . LUMBAR DISC SURGERY  2012   L-5, Dr Vennie Homans  . LUMBAR FUSION  2015   rod, plates  . r arm surger      Social History   Socioeconomic History  . Marital status: Married    Spouse name: Not on file  . Number of children: 2  . Years of education: Not on file  . Highest education level: Not on file  Occupational History  . Occupation: retired  Scientific laboratory technician  . Financial resource strain: Not on file  . Food insecurity    Worry: Not on file    Inability: Not on file  . Transportation needs    Medical: Not on file    Non-medical: Not on file  Tobacco Use  . Smoking status: Former Research scientist (life sciences)  . Smokeless tobacco: Never Used  . Tobacco  comment: quit in the 90, light smoker   Substance and Sexual Activity  . Alcohol use: No    Alcohol/week: 0.0 standard drinks  . Drug use: No  . Sexual activity: Not on file  Lifestyle  . Physical activity    Days per week: Not on file    Minutes per session: Not on file  . Stress: Not on file  Relationships  . Social Herbalist on phone: Not on file    Gets together: Not on file    Attends religious service: Not on file    Active member of club or organization: Not on file    Attends meetings of clubs or organizations: Not on file    Relationship status: Not on file  . Intimate partner violence    Fear of current or ex partner: Not on file    Emotionally abused: Not on file    Physically abused: Not on file    Forced sexual activity: Not on file  Other Topics Concern  . Not on file  Social History Narrative   Household: pt, wife, 2 cats   children 2 and 2 step daughters, has g-kids      Allergies as of 02/07/2019      Reactions  Penicillins Swelling   redness diffusely 1962      Medication List       Accurate as of February 07, 2019  1:56 PM. If you have any questions, ask your nurse or doctor.        atorvastatin 10 MG tablet Commonly known as: LIPITOR Take 1 tablet (10 mg total) by mouth at bedtime.   HYDROcodone-acetaminophen 10-325 MG tablet Commonly known as: NORCO Take 1 tablet by mouth every 6 (six) hours as needed.   losartan 100 MG tablet Commonly known as: COZAAR Take 1 tablet (100 mg total) by mouth daily.   morphine 30 MG 12 hr tablet Commonly known as: MS CONTIN Take 30 mg by mouth 3 (three) times daily.   multivitamin with minerals Tabs tablet Take 1 tablet by mouth daily.   omeprazole 20 MG capsule Commonly known as: PRILOSEC Take 1 capsule (20 mg total) by mouth 2 (two) times daily.   ondansetron 4 MG tablet Commonly known as: ZOFRAN Take 1 tablet (4 mg total) by mouth every 8 (eight) hours as needed for nausea or  vomiting.   tizanidine 2 MG capsule Commonly known as: ZANAFLEX Take 2-4 mg by mouth every 8 (eight) hours as needed. Not to exceed 3 doses in 24 hours.           Objective:   Physical Exam BP 125/73 (BP Location: Left Arm, Patient Position: Sitting, Cuff Size: Normal)   Pulse 100   Temp (!) 97 F (36.1 C) (Temporal)   Resp 16   Ht 5\' 10"  (1.778 m)   Wt 218 lb 2 oz (98.9 kg)   SpO2 97%   BMI 31.30 kg/m  General:   Well developed, BMI noted.  Mild distress upon arrival, holding his abdomen, I asked if he was hurting severely and he said no, just discomfort. HEENT:  Normocephalic . Face symmetric, atraumatic Lungs:  CTA B Normal respiratory effort, no intercostal retractions, no accessory muscle use. Heart: RRR,  no murmur.  no pretibial edema bilaterally  Abdomen:  Not distended, is soft, nontender, no mass or rebound. No CVA tenderness Skin: Not pale. Not jaundice DRE: Sphincter normal, no stools, prostate not tender Neurologic:  alert & oriented X3.  Speech normal, gait appropriate for age and unassisted Psych--  Cognition and judgment appear intact.  Cooperative with normal attention span and concentration.  Behavior appropriate. No anxious or depressed appearing.     Assessment       Assessment Hyperglycemia (A1c 5.28 May 2018) HTN High cholesterol h/o HA-- s/p extensive w/u in the past. Has seen Dr Maureen Chatters and neuro @ [facility]   h/o kidney stones , multiple Chronic back pain-- sees pain management, see surgeries Chronic hoarseness: Never seen by ENT Sees dermatology   PLAN UTI: Symptoms consistent with UTI, no prostatitis on physical exam.  He was on mild distress when he arrived reporting abdominal discomfort but abdominal exam was benign and at the end he seemed in no distress. Reports symptoms are consistent with previous UTIs. Udip: Blood, leukocytes Plan: Drink plenty of fluids, Bactrim DS, UA urine culture, call if not gradually  improving. See AVS Next appointment already scheduled for November 2020

## 2019-02-07 NOTE — Telephone Encounter (Signed)
Appt scheduled

## 2019-02-08 LAB — URINALYSIS, ROUTINE W REFLEX MICROSCOPIC
Bilirubin Urine: NEGATIVE
Ketones, ur: NEGATIVE
Nitrite: NEGATIVE
Specific Gravity, Urine: <=1.005 — AB (ref 1.000–1.030)
Total Protein, Urine: NEGATIVE
Urine Glucose: NEGATIVE
Urobilinogen, UA: 0.2 (ref 0.0–1.0)
pH: 6 (ref 5.0–8.0)

## 2019-02-09 LAB — URINE CULTURE
MICRO NUMBER:: 793447
SPECIMEN QUALITY:: ADEQUATE

## 2019-02-09 NOTE — Assessment & Plan Note (Signed)
UTI: Symptoms consistent with UTI, no prostatitis on physical exam.  He was on mild distress when he arrived reporting abdominal discomfort but abdominal exam was benign and at the end he seemed in no distress. Reports symptoms are consistent with previous UTIs. Udip: Blood, leukocytes Plan: Drink plenty of fluids, Bactrim DS, UA urine culture, call if not gradually improving. See AVS Next appointment already scheduled for November 2020

## 2019-02-11 DIAGNOSIS — M5431 Sciatica, right side: Secondary | ICD-10-CM | POA: Diagnosis not present

## 2019-02-11 DIAGNOSIS — Z6831 Body mass index (BMI) 31.0-31.9, adult: Secondary | ICD-10-CM | POA: Diagnosis not present

## 2019-03-08 ENCOUNTER — Encounter: Payer: Self-pay | Admitting: Family Medicine

## 2019-03-08 ENCOUNTER — Other Ambulatory Visit: Payer: Self-pay

## 2019-03-08 ENCOUNTER — Ambulatory Visit (INDEPENDENT_AMBULATORY_CARE_PROVIDER_SITE_OTHER): Payer: PPO | Admitting: Family Medicine

## 2019-03-08 VITALS — BP 118/68 | HR 111 | Temp 96.8°F | Ht 70.0 in | Wt 217.0 lb

## 2019-03-08 DIAGNOSIS — N3001 Acute cystitis with hematuria: Secondary | ICD-10-CM

## 2019-03-08 LAB — POC URINALSYSI DIPSTICK (AUTOMATED)
Bilirubin, UA: NEGATIVE
Glucose, UA: NEGATIVE
Ketones, UA: NEGATIVE
Nitrite, UA: POSITIVE
Protein, UA: POSITIVE — AB
Spec Grav, UA: 1.01 (ref 1.010–1.025)
Urobilinogen, UA: 0.2 E.U./dL
pH, UA: 7 (ref 5.0–8.0)

## 2019-03-08 MED ORDER — NITROFURANTOIN MACROCRYSTAL 100 MG PO CAPS: 100.0000 mg | ORAL_CAPSULE | Freq: Two times a day (BID) | ORAL | 0 refills | Status: AC

## 2019-03-08 NOTE — Patient Instructions (Addendum)
Stay hydrated.   Warning signs/symptoms: Uncontrollable nausea/vomiting, fevers, worsening symptoms despite treatment, confusion.  Give [place] around 2 business days to get culture back to you.  I recommend getting the flu shot in mid October. This suggestion would change if the CDC comes out with a different recommendation.   Let us know if you need anything.

## 2019-03-08 NOTE — Progress Notes (Signed)
Chief Complaint  Patient presents with  . Dysuria  . Urinary Frequency    Bradley Navarro is a 78 y.o. male here for possible UTI.  Duration: 2 days. Symptoms: urinary frequency, urinary retention and dysuria Denies: hematuria, urinary hesitancy, fever, nausea, vomiting, urinary incontinence and flank pain Has had UTI's before.  Is circumcised.   ROS:  Constitutional: denies fever GU: As noted in HPI  Past Medical History:  Diagnosis Date  . Chronic back pain   . Elevated BP without diagnosis of hypertension 04/24/2017  . History of headache    frontal lobe cluster headaches  . History of kidney stones   . Post laminectomy syndrome   . Sciatica of right side 2016   S/P L5-S1 revision lam/PSF on 04-10-12 w/ relief of RLE pain post op, MRI 01/2014 shows minimal foraminal stenosis at L4-5 w/o recurrent stenosis at L5-S1    BP 118/68 (BP Location: Left Arm, Patient Position: Sitting, Cuff Size: Large)   Pulse (!) 111   Temp (!) 96.8 F (36 C) (Temporal)   Ht 5\' 10"  (1.778 m)   Wt 217 lb (98.4 kg)   SpO2 94%   BMI 31.14 kg/m  General: Awake, alert, appears stated age Heart: RRR Lungs: CTAB, normal respiratory effort, no accessory muscle usage Abd: BS+, soft, NT, ND, no masses or organomegaly MSK: No CVA tenderness, neg Lloyd's sign Psych: Age appropriate judgment and insight  Acute cystitis with hematuria - Plan: nitrofurantoin (MACRODANTIN) 100 MG capsule  7 days of above, bid.  Offered uro referral due to recurrence, he declined at this time but will let us know if he changes his mind.  Stay hydrated. Seek immediate care if pt starts to develop fevers, new/worsening symptoms, uncontrollable N/V. F/u prn. The patient voiced understanding and agreement to the plan.  [place], DO 03/08/19 10:11 AM

## 2019-03-10 LAB — URINE CULTURE
MICRO NUMBER:: 897734
SPECIMEN QUALITY:: ADEQUATE

## 2019-03-21 ENCOUNTER — Encounter: Payer: Self-pay | Admitting: Internal Medicine

## 2019-03-21 ENCOUNTER — Ambulatory Visit (HOSPITAL_BASED_OUTPATIENT_CLINIC_OR_DEPARTMENT_OTHER)
Admission: RE | Admit: 2019-03-21 | Discharge: 2019-03-21 | Disposition: A | Discharge status: Home / Self Care | Payer: PPO | Source: Ambulatory Visit | Attending: Internal Medicine | Admitting: Internal Medicine

## 2019-03-21 ENCOUNTER — Other Ambulatory Visit: Payer: Self-pay

## 2019-03-21 ENCOUNTER — Ambulatory Visit (INDEPENDENT_AMBULATORY_CARE_PROVIDER_SITE_OTHER): Payer: PPO | Admitting: Internal Medicine

## 2019-03-21 ENCOUNTER — Ambulatory Visit (HOSPITAL_BASED_OUTPATIENT_CLINIC_OR_DEPARTMENT_OTHER): Admission: RE | Admit: 2019-03-21 | Payer: PPO | Source: Ambulatory Visit

## 2019-03-21 VITALS — BP 131/57 | HR 83 | Temp 97.2°F | Resp 16 | Ht 70.0 in | Wt 217.5 lb

## 2019-03-21 DIAGNOSIS — N390 Urinary tract infection, site not specified: Secondary | ICD-10-CM | POA: Diagnosis not present

## 2019-03-21 DIAGNOSIS — N3001 Acute cystitis with hematuria: Secondary | ICD-10-CM

## 2019-03-21 DIAGNOSIS — N452 Orchitis: Secondary | ICD-10-CM | POA: Insufficient documentation

## 2019-03-21 DIAGNOSIS — N5089 Other specified disorders of the male genital organs: Secondary | ICD-10-CM | POA: Diagnosis not present

## 2019-03-21 DIAGNOSIS — Z09 Encounter for follow-up examination after completed treatment for conditions other than malignant neoplasm: Secondary | ICD-10-CM | POA: Diagnosis not present

## 2019-03-21 LAB — BASIC METABOLIC PANEL
BUN: 14 mg/dL (ref 6–23)
CO2: 28 mEq/L (ref 19–32)
Calcium: 9.5 mg/dL (ref 8.4–10.5)
Chloride: 99 mEq/L (ref 96–112)
Creatinine, Ser: 0.84 mg/dL (ref 0.40–1.50)
GFR: 88.59 mL/min (ref >60.00)
Glucose, Bld: 86 mg/dL (ref 70–99)
Potassium: 4.2 mEq/L (ref 3.5–5.1)
Sodium: 139 mEq/L (ref 135–145)

## 2019-03-21 LAB — URINALYSIS, ROUTINE W REFLEX MICROSCOPIC
Bilirubin Urine: NEGATIVE
Ketones, ur: NEGATIVE
Nitrite: POSITIVE — AB
Specific Gravity, Urine: <=1.005 — AB (ref 1.000–1.030)
Total Protein, Urine: 30 — AB
Urine Glucose: NEGATIVE
Urobilinogen, UA: 0.2 (ref 0.0–1.0)
pH: 6.5 (ref 5.0–8.0)

## 2019-03-21 LAB — POC URINALSYSI DIPSTICK (AUTOMATED)
Bilirubin, UA: NEGATIVE
Glucose, UA: NEGATIVE
Ketones, UA: NEGATIVE
Nitrite, UA: NEGATIVE
Protein, UA: POSITIVE — AB
Spec Grav, UA: 1.01 (ref 1.010–1.025)
Urobilinogen, UA: 0.2 E.U./dL
pH, UA: 6 (ref 5.0–8.0)

## 2019-03-21 LAB — CBC WITH DIFFERENTIAL/PLATELET
Basophils Absolute: 0.1 10*3/uL (ref 0.0–0.1)
Basophils Relative: 0.9 % (ref 0.0–3.0)
Eosinophils Absolute: 0 10*3/uL (ref 0.0–0.7)
Eosinophils Relative: 0.2 % (ref 0.0–5.0)
HCT: 39.7 % (ref 39.0–52.0)
Hemoglobin: 13.3 g/dL (ref 13.0–17.0)
Lymphocytes Relative: 17.9 % (ref 12.0–46.0)
Lymphs Abs: 2.6 10*3/uL (ref 0.7–4.0)
MCHC: 33.5 g/dL (ref 30.0–36.0)
MCV: 96 fl (ref 78.0–100.0)
Monocytes Absolute: 1.1 10*3/uL — ABNORMAL HIGH (ref 0.1–1.0)
Monocytes Relative: 7.5 % (ref 3.0–12.0)
Neutro Abs: 10.5 10*3/uL — ABNORMAL HIGH (ref 1.4–7.7)
Neutrophils Relative %: 73.5 % (ref 43.0–77.0)
Platelets: 263 10*3/uL (ref 150.0–400.0)
RBC: 4.14 Mil/uL — ABNORMAL LOW (ref 4.22–5.81)
RDW: 12.5 % (ref 11.5–15.5)
WBC: 14.4 10*3/uL — ABNORMAL HIGH (ref 4.0–10.5)

## 2019-03-21 MED ORDER — LEVOFLOXACIN 500 MG PO TABS: 500.0000 mg | ORAL_TABLET | Freq: Every day | ORAL | 0 refills | Status: DC

## 2019-03-21 NOTE — Patient Instructions (Signed)
GO TO THE LAB : Get the blood work      STOP BY THE FIRST FLOOR: Arrange the ultrasound to be done today or at most tomorrow morning  Drink plenty fluids  Tylenol as needed for pain  You may like to put a towel under your scrotum to help with discomfort  Start taking antibiotic called Levaquin 1 every day.  Call anytime if severe pain, fever, chills, palpitations, increased swelling.

## 2019-03-21 NOTE — Progress Notes (Signed)
Subjective:    Patient ID: Bradley Navarro, male    DOB: 02-27-1941, 78 y.o.   MRN: [id number]  DOS:  03/21/2019 Type of visit - description: Acute The patient was diagnosed with a UTI 02/07/2019, urine culture showed E. coli, received Bactrim for 2 weeks. Symptoms never completely resolved.  Was seen again 03/08/2019 with UTI symptoms, urine culture showed again E. coli, was prescribed Macrobid for a week.  He is here with urinary frequency, dysuria for the last couple of days. Has noted his urine to have "pieces of pus". Denies clots or blood in the urine. Also yesterday his right testicle become quite swollen and tender to palpation.   Review of Systems  No actual fever chills No flank pain No nausea, vomiting, diarrhea.  Past Medical History:  Diagnosis Date  . Chronic back pain   . Elevated BP without diagnosis of hypertension 04/24/2017  . History of headache    frontal lobe cluster headaches  . History of kidney stones   . Post laminectomy syndrome   . Sciatica of right side 2016   S/P L5-S1 revision lam/PSF on 04-10-12 w/ relief of RLE pain post op, MRI 01/2014 shows minimal foraminal stenosis at L4-5 w/o recurrent stenosis at L5-S1    Past Surgical History:  Procedure Laterality Date  . APPENDECTOMY    . LUMBAR DISC SURGERY  2012   L-5, Dr Vennie Homans  . LUMBAR FUSION  2015   rod, plates  . r arm surger      Social History   Socioeconomic History  . Marital status: Married    Spouse name: Not on file  . Number of children: 2  . Years of education: Not on file  . Highest education level: Not on file  Occupational History  . Occupation: retired  Scientific laboratory technician  . Financial resource strain: Not on file  . Food insecurity    Worry: Not on file    Inability: Not on file  . Transportation needs    Medical: Not on file    Non-medical: Not on file  Tobacco Use  . Smoking status: Former Research scientist (life sciences)  . Smokeless tobacco: Never Used  . Tobacco comment: quit in the  90, light smoker   Substance and Sexual Activity  . Alcohol use: No    Alcohol/week: 0.0 standard drinks  . Drug use: No  . Sexual activity: Not on file  Lifestyle  . Physical activity    Days per week: Not on file    Minutes per session: Not on file  . Stress: Not on file  Relationships  . Social Herbalist on phone: Not on file    Gets together: Not on file    Attends religious service: Not on file    Active member of club or organization: Not on file    Attends meetings of clubs or organizations: Not on file    Relationship status: Not on file  . Intimate partner violence    Fear of current or ex partner: Not on file    Emotionally abused: Not on file    Physically abused: Not on file    Forced sexual activity: Not on file  Other Topics Concern  . Not on file  Social History Narrative   Household: pt, wife, 2 cats   children 2 and 2 step daughters, has g-kids      Allergies as of 03/21/2019      Reactions   Penicillins Swelling  redness diffusely 1962      Medication List       Accurate as of March 21, 2019  1:25 PM. If you have any questions, ask your nurse or doctor.        atorvastatin 10 MG tablet Commonly known as: LIPITOR Take 1 tablet (10 mg total) by mouth at bedtime.   HYDROcodone-acetaminophen 10-325 MG tablet Commonly known as: NORCO Take 1 tablet by mouth every 6 (six) hours as needed.   losartan 100 MG tablet Commonly known as: COZAAR Take 1 tablet (100 mg total) by mouth daily.   morphine 30 MG 12 hr tablet Commonly known as: MS CONTIN Take 30 mg by mouth 3 (three) times daily.   multivitamin with minerals Tabs tablet Take 1 tablet by mouth daily.   omeprazole 20 MG capsule Commonly known as: PRILOSEC Take 1 capsule (20 mg total) by mouth 2 (two) times daily.   tizanidine 2 MG capsule Commonly known as: ZANAFLEX Take 2-4 mg by mouth every 8 (eight) hours as needed. Not to exceed 3 doses in 24 hours.            Objective:   Physical Exam BP (!) 131/57 (BP Location: Left Arm, Patient Position: Sitting, Cuff Size: Normal)   Pulse 83   Temp (!) 97.2 F (36.2 C) (Temporal)   Resp 16   Ht 5\' 10"  (1.778 m)   Wt 217 lb 8 oz (98.7 kg)   SpO2 100%   BMI 31.21 kg/m  General:   Well developed, NAD, BMI noted.  HEENT:  Normocephalic . Face symmetric, atraumatic Abdomen:  Not distended, soft, non-tender. No rebound or rigidity.   GU: Penis normal Left testicle soft, nontender Left testicle: Definitely swollen, size slightly larger than a golf ball.  + TTP. Skin: Not pale. Not jaundice Neurologic:  alert & oriented X3.  Speech normal, gait appropriate for age and unassisted Psych--  Cognition and judgment appear intact.  Cooperative with normal attention span and concentration.  Behavior appropriate. No anxious or depressed appearing.     Assessment       Assessment Hyperglycemia (A1c 5.28 May 2018) HTN High cholesterol h/o HA-- s/p extensive w/u in the past. Has seen Dr Maureen Chatters and neuro @ [facility]   h/o kidney stones , multiple Chronic back pain-- sees pain management, see surgeries Chronic hoarseness: Never seen by ENT Sees dermatology   PLAN UTI, suspect orchitis See HPI, having LUTS since 01-2019, had two UCX showing  E. coli, s/p  Bactrim for 2 weeks, Macrobid for 1 week. Symptoms never went away completely. Symptoms increased for 2 days, right testicular swelling for 1 day. He has either a partially treated vs  recurrent UTI and acute orchitis.  No history of prostate problems, remote history of kidney stones. Urinary dip: Cloudy, + WBCs Plan: UA, urine culture, start ultrasound scrotum, DDX orchitis, rule out torsion. Levaquin for 2 weeks although he may require 4 weeks. BMP and CBC Urology referral ER if severe fever, chills, patient aware of potential for severe infection. Addendum:  Ultrasound showed no orchitis or testicular torsion but a complex septated  fluid at the right scrotum , nonspecific.  I wonder about an abscess.  White count elevated at 14.4, BMP normal. We were able to get a urology appointment for tomorrow at 9.30, I appreciate urology help. I personally notified the patient.  States that he might not be able to be there at that time, he takes care of his wife.  Provided  [facility] number strongly encouraged to call them now and get a time that is convenient  for him.  If worse, needs to go to the ER.

## 2019-03-21 NOTE — Assessment & Plan Note (Addendum)
UTI, suspect orchitis See HPI, having LUTS since 01-2019, had two UCX showing  E. coli, s/p  Bactrim for 2 weeks, Macrobid for 1 week. Symptoms never went away completely. Symptoms increased for 2 days, right testicular swelling for 1 day. He has either a partially treated vs  recurrent UTI and acute orchitis.  No history of prostate problems, remote history of kidney stones. Urinary dip: Cloudy, + WBCs Plan: UA, urine culture, start ultrasound scrotum, DDX orchitis, rule out torsion. Levaquin for 2 weeks although he may require 4 weeks. BMP and CBC Urology referral ER if severe fever, chills, patient aware of potential for severe infection. Addendum:  Ultrasound showed no orchitis or testicular torsion but a complex septated fluid at the right scrotum , nonspecific.  I wonder about an abscess.  White count elevated at 14.4, BMP normal. We were able to get a urology appointment for tomorrow at 9.30, I appreciate urology help. I personally notified the patient.  States that he might not be able to be there at that time, he takes care of his wife.  Provided [facility] telephone number strongly encouraged to call them now and get a time that is convenient  for him.  If worse, needs to go to the ER.

## 2019-03-21 NOTE — Progress Notes (Signed)
Pre visit review using our clinic review tool, if applicable. No additional management support is needed unless otherwise documented below in the visit note. 

## 2019-03-22 DIAGNOSIS — N430 Encysted hydrocele: Secondary | ICD-10-CM | POA: Diagnosis not present

## 2019-03-22 DIAGNOSIS — R8279 Other abnormal findings on microbiological examination of urine: Secondary | ICD-10-CM | POA: Diagnosis not present

## 2019-03-22 DIAGNOSIS — N452 Orchitis: Secondary | ICD-10-CM | POA: Diagnosis not present

## 2019-03-22 DIAGNOSIS — N50811 Right testicular pain: Secondary | ICD-10-CM | POA: Diagnosis not present

## 2019-03-23 LAB — URINE CULTURE
MICRO NUMBER:: 944025
SPECIMEN QUALITY:: ADEQUATE

## 2019-03-27 ENCOUNTER — Telehealth: Payer: Self-pay | Admitting: Internal Medicine

## 2019-03-27 NOTE — Telephone Encounter (Signed)
Spoke w/ Pt- he saw Dr. Gloriann Loan at [facility] on 10/2 and was given Flomax- he is doing well- says he is about 90% better- is still "sore down there" but swelling is better, and is to follow-up with Dr. Gloriann Loan in 3 weeks. He thanked me and Dr. Larose Kells for our help.

## 2019-03-27 NOTE — Telephone Encounter (Signed)
Noted, thank you

## 2019-03-27 NOTE — Telephone Encounter (Signed)
See last visit, check on the patient, was able to see urology?  Feeling better?

## 2019-04-12 DIAGNOSIS — M5431 Sciatica, right side: Secondary | ICD-10-CM | POA: Diagnosis not present

## 2019-04-12 DIAGNOSIS — M4716 Other spondylosis with myelopathy, lumbar region: Secondary | ICD-10-CM | POA: Diagnosis not present

## 2019-04-12 DIAGNOSIS — R351 Nocturia: Secondary | ICD-10-CM | POA: Diagnosis not present

## 2019-04-12 DIAGNOSIS — R3912 Poor urinary stream: Secondary | ICD-10-CM | POA: Diagnosis not present

## 2019-04-12 DIAGNOSIS — N401 Enlarged prostate with lower urinary tract symptoms: Secondary | ICD-10-CM | POA: Diagnosis not present

## 2019-04-17 ENCOUNTER — Other Ambulatory Visit: Payer: Self-pay

## 2019-04-18 ENCOUNTER — Other Ambulatory Visit: Payer: Self-pay

## 2019-04-18 ENCOUNTER — Ambulatory Visit (INDEPENDENT_AMBULATORY_CARE_PROVIDER_SITE_OTHER): Payer: PPO | Admitting: Internal Medicine

## 2019-04-18 ENCOUNTER — Encounter: Payer: Self-pay | Admitting: Internal Medicine

## 2019-04-18 VITALS — BP 144/73 | HR 88 | Temp 97.2°F | Resp 16 | Ht 70.0 in | Wt 218.0 lb

## 2019-04-18 DIAGNOSIS — N452 Orchitis: Secondary | ICD-10-CM | POA: Diagnosis not present

## 2019-04-18 DIAGNOSIS — M542 Cervicalgia: Secondary | ICD-10-CM

## 2019-04-18 MED ORDER — PREDNISONE 10 MG PO TABS: ORAL_TABLET | ORAL | 0 refills | Status: DC

## 2019-04-18 NOTE — Assessment & Plan Note (Signed)
Neck pain: Likely a sprain or early radiculopathy. Plan: Continue with pain medication for low back pain.  Add prednisone, heating pad, icy hot as he is doing, call if not better UTI, orchitis: Seen by urology 04/12/2019, see note, testicular exam was normal Flu shot: He is prescribed prednisone today, postpone for few days

## 2019-04-18 NOTE — Progress Notes (Signed)
Subjective:    Patient ID: Bradley Navarro, male    DOB: Dec 04, 1940, 78 y.o.   MRN: [id number]  DOS:  04/18/2019 Type of visit - description: Acute Symptoms started in 7 to 10 days ago, he woke up with pain at the left posterior area of the neck with some radiation to the left trapezoid area. Pain is worse with head motion. He is applying icy hot and does not seem to be better. He continue taking his pain medication for low back pain.  No previous history of neck surgery or major problems with neck pain.  Review of Systems Denies fever chills No injury or fall No upper or lower extremity paresthesias No rash on the neck Gait is at baseline  Past Medical History:  Diagnosis Date  . Chronic back pain   . Elevated BP without diagnosis of hypertension 04/24/2017  . History of headache    frontal lobe cluster headaches  . History of kidney stones   . Post laminectomy syndrome   . Sciatica of right side 2016   S/P L5-S1 revision lam/PSF on 04-10-12 w/ relief of RLE pain post op, MRI 01/2014 shows minimal foraminal stenosis at L4-5 w/o recurrent stenosis at L5-S1    Past Surgical History:  Procedure Laterality Date  . APPENDECTOMY    . LUMBAR DISC SURGERY  2012   L-5, Dr Vennie Homans  . LUMBAR FUSION  2015   rod, plates  . r arm surger      Social History   Socioeconomic History  . Marital status: Married    Spouse name: Not on file  . Number of children: 2  . Years of education: Not on file  . Highest education level: Not on file  Occupational History  . Occupation: retired  Scientific laboratory technician  . Financial resource strain: Not on file  . Food insecurity    Worry: Not on file    Inability: Not on file  . Transportation needs    Medical: Not on file    Non-medical: Not on file  Tobacco Use  . Smoking status: Former Research scientist (life sciences)  . Smokeless tobacco: Never Used  . Tobacco comment: quit in the 90, light smoker   Substance and Sexual Activity  . Alcohol use: No   Alcohol/week: 0.0 standard drinks  . Drug use: No  . Sexual activity: Not on file  Lifestyle  . Physical activity    Days per week: Not on file    Minutes per session: Not on file  . Stress: Not on file  Relationships  . Social Herbalist on phone: Not on file    Gets together: Not on file    Attends religious service: Not on file    Active member of club or organization: Not on file    Attends meetings of clubs or organizations: Not on file    Relationship status: Not on file  . Intimate partner violence    Fear of current or ex partner: Not on file    Emotionally abused: Not on file    Physically abused: Not on file    Forced sexual activity: Not on file  Other Topics Concern  . Not on file  Social History Narrative   Household: pt, wife, 2 cats   children 2 and 2 step daughters, has g-kids      Allergies as of 04/18/2019      Reactions   Penicillins Swelling   redness diffusely 1962  Medication List       Accurate as of April 18, 2019  5:36 PM. If you have any questions, ask your nurse or doctor.        STOP taking these medications   levofloxacin 500 MG tablet Commonly known as: LEVAQUIN Stopped by: Kathlene November, MD     TAKE these medications   atorvastatin 10 MG tablet Commonly known as: LIPITOR Take 1 tablet (10 mg total) by mouth at bedtime.   HYDROcodone-acetaminophen 10-325 MG tablet Commonly known as: NORCO Take 1 tablet by mouth every 6 (six) hours as needed.   losartan 100 MG tablet Commonly known as: COZAAR Take 1 tablet (100 mg total) by mouth daily.   morphine 30 MG 12 hr tablet Commonly known as: MS CONTIN Take 30 mg by mouth 3 (three) times daily.   multivitamin with minerals Tabs tablet Take 1 tablet by mouth daily.   omeprazole 20 MG capsule Commonly known as: PRILOSEC Take 1 capsule (20 mg total) by mouth 2 (two) times daily.   predniSONE 10 MG tablet Commonly known as: DELTASONE 4 tablets x 2 days, 3 tabs x 2  days, 2 tabs x 2 days, 1 tab x 2 days Started by: Kathlene November, MD   tamsulosin 0.4 MG Caps capsule Commonly known as: FLOMAX Take 0.4 mg by mouth daily.   tizanidine 2 MG capsule Commonly known as: ZANAFLEX Take 2-4 mg by mouth every 8 (eight) hours as needed. Not to exceed 3 doses in 24 hours.           Objective:   Physical Exam Neck:     BP (!) 144/73 (BP Location: Left Arm, Patient Position: Sitting, Cuff Size: Normal)   Pulse 88   Temp (!) 97.2 F (36.2 C) (Temporal)   Resp 16   Ht 5\' 10"  (1.778 m)   Wt 218 lb (98.9 kg)   SpO2 96%   BMI 31.28 kg/m  General:   Well developed, NAD, BMI noted. HEENT:  Normocephalic . Face symmetric, atraumatic Neck: Symmetric, no TTP at the cervical spine.  Range of motion minimally limited otherwise normal.  Skin: Not pale. Not jaundice MSK: Shoulders full range of motion, with left shoulder elevation, neck pain got slightly worse however Neurologic:  alert & oriented X3.  Speech normal, gait at baseline, assisted by a cane.  Motor symmetric, DTRs symmetric. Psych--  Cognition and judgment appear intact.  Cooperative with normal attention span and concentration.  Behavior appropriate. No anxious or depressed appearing.      Assessment      Assessment Hyperglycemia (A1c 5.28 May 2018) HTN High cholesterol h/o HA-- s/p extensive w/u in the past. Has seen Dr Maureen Chatters and neuro @ [facility]   h/o kidney stones , multiple Chronic back pain-- sees pain management, see surgeries Chronic hoarseness: Never seen by ENT Sees dermatology   PLAN Neck pain: Likely a sprain or early radiculopathy. Plan: Continue with pain medication for low back pain.  Add prednisone, heating pad, icy hot as he is doing, call if not better UTI, orchitis: Seen by urology 04/12/2019, see note, testicular exam was normal Flu shot: He is prescribed prednisone today, postpone for few days

## 2019-04-18 NOTE — Patient Instructions (Signed)
For neck pain:  Start prednisone as prescribed  Continue pain medication  Apply a heating pad on the area  Okay to continue using icy-hot  If you are not gradually better or if the symptoms get worse: Please contact your spine doctors or me.  Postpone your flu shot for about 10 days

## 2019-04-18 NOTE — Progress Notes (Signed)
Pre visit review using our clinic review tool, if applicable. No additional management support is needed unless otherwise documented below in the visit note. 

## 2019-05-02 ENCOUNTER — Other Ambulatory Visit: Payer: Self-pay

## 2019-05-02 ENCOUNTER — Encounter: Payer: Self-pay | Admitting: Internal Medicine

## 2019-05-02 ENCOUNTER — Ambulatory Visit (INDEPENDENT_AMBULATORY_CARE_PROVIDER_SITE_OTHER): Payer: PPO | Admitting: Internal Medicine

## 2019-05-02 VITALS — BP 136/61 | HR 96 | Temp 97.5°F | Resp 16 | Ht 70.0 in | Wt 215.5 lb

## 2019-05-02 DIAGNOSIS — Z0000 Encounter for general adult medical examination without abnormal findings: Secondary | ICD-10-CM | POA: Diagnosis not present

## 2019-05-02 DIAGNOSIS — R739 Hyperglycemia, unspecified: Secondary | ICD-10-CM

## 2019-05-02 DIAGNOSIS — Z23 Encounter for immunization: Secondary | ICD-10-CM

## 2019-05-02 DIAGNOSIS — E7800 Pure hypercholesterolemia, unspecified: Secondary | ICD-10-CM

## 2019-05-02 LAB — LIPID PANEL
Cholesterol: 152 mg/dL (ref 0–200)
HDL: 34.7 mg/dL — ABNORMAL LOW (ref >39.00)
NonHDL: 117.58
Total CHOL/HDL Ratio: 4
Triglycerides: 230 mg/dL — ABNORMAL HIGH (ref 0.0–149.0)
VLDL: 46 mg/dL — ABNORMAL HIGH (ref 0.0–40.0)

## 2019-05-02 LAB — AST: AST: 19 U/L (ref 0–37)

## 2019-05-02 LAB — ALT: ALT: 18 U/L (ref 0–53)

## 2019-05-02 LAB — TSH: TSH: 3.17 u[IU]/mL (ref 0.35–4.50)

## 2019-05-02 LAB — LDL CHOLESTEROL, DIRECT: Direct LDL: 89 mg/dL

## 2019-05-02 LAB — HEMOGLOBIN A1C: Hgb A1c MFr Bld: 6.1 % (ref 4.6–6.5)

## 2019-05-02 NOTE — Progress Notes (Signed)
Subjective:    Patient ID: Bradley Navarro, male    DOB: December 24, 1940, 78 y.o.   MRN: [id number]  DOS:  05/02/2019 Type of visit - description: Here for CPX In general feels well, was seen with neck pain before, neck pain improved.    Review of Systems Denies any LUTS, chest pain, difficulty breathing, palpitations, no GI symptoms  Other than above, a 14 point review of systems is negative    Past Medical History:  Diagnosis Date  . Chronic back pain   . Elevated BP without diagnosis of hypertension 04/24/2017  . History of headache    frontal lobe cluster headaches  . History of kidney stones   . Post laminectomy syndrome   . Sciatica of right side 2016   S/P L5-S1 revision lam/PSF on 04-10-12 w/ relief of RLE pain post op, MRI 01/2014 shows minimal foraminal stenosis at L4-5 w/o recurrent stenosis at L5-S1    Past Surgical History:  Procedure Laterality Date  . APPENDECTOMY    . LUMBAR DISC SURGERY  2012   L-5, Dr Vennie Homans  . LUMBAR FUSION  2015   rod, plates  . r arm surger      Social History   Socioeconomic History  . Marital status: Married    Spouse name: Not on file  . Number of children: 2  . Years of education: Not on file  . Highest education level: Not on file  Occupational History  . Occupation: retired  Scientific laboratory technician  . Financial resource strain: Not on file  . Food insecurity    Worry: Not on file    Inability: Not on file  . Transportation needs    Medical: Not on file    Non-medical: Not on file  Tobacco Use  . Smoking status: Former Research scientist (life sciences)  . Smokeless tobacco: Never Used  . Tobacco comment: quit in the 90, light smoker   Substance and Sexual Activity  . Alcohol use: No    Alcohol/week: 0.0 standard drinks  . Drug use: No  . Sexual activity: Not on file  Lifestyle  . Physical activity    Days per week: Not on file    Minutes per session: Not on file  . Stress: Not on file  Relationships  . Social Herbalist on phone:  Not on file    Gets together: Not on file    Attends religious service: Not on file    Active member of club or organization: Not on file    Attends meetings of clubs or organizations: Not on file    Relationship status: Not on file  . Intimate partner violence    Fear of current or ex partner: Not on file    Emotionally abused: Not on file    Physically abused: Not on file    Forced sexual activity: Not on file  Other Topics Concern  . Not on file  Social History Narrative   Household: pt, wife, 2 cats   children 2 and 2 step daughters, has g-kids     Family History  Problem Relation Age of Onset  . CAD Father 37       MI   . Dementia Mother   . Colon cancer Neg Hx   . Prostate cancer Neg Hx   . Diabetes Neg Hx      Allergies as of 05/02/2019      Reactions   Penicillins Swelling   redness diffusely 1962  Medication List       Accurate as of May 02, 2019 11:59 PM. If you have any questions, ask your nurse or doctor.        atorvastatin 10 MG tablet Commonly known as: LIPITOR Take 1 tablet (10 mg total) by mouth at bedtime.   HYDROcodone-acetaminophen 10-325 MG tablet Commonly known as: NORCO Take 1 tablet by mouth every 6 (six) hours as needed.   losartan 100 MG tablet Commonly known as: COZAAR Take 1 tablet (100 mg total) by mouth daily.   morphine 30 MG 12 hr tablet Commonly known as: MS CONTIN Take 30 mg by mouth 3 (three) times daily.   multivitamin with minerals Tabs tablet Take 1 tablet by mouth daily.   omeprazole 20 MG capsule Commonly known as: PRILOSEC Take 1 capsule (20 mg total) by mouth 2 (two) times daily.   predniSONE 10 MG tablet Commonly known as: DELTASONE 4 tablets x 2 days, 3 tabs x 2 days, 2 tabs x 2 days, 1 tab x 2 days   tamsulosin 0.4 MG Caps capsule Commonly known as: FLOMAX Take 0.4 mg by mouth daily.   tizanidine 2 MG capsule Commonly known as: ZANAFLEX Take 2-4 mg by mouth every 8 (eight) hours as needed.  Not to exceed 3 doses in 24 hours.           Objective:   Physical Exam BP 136/61 (BP Location: Left Arm, Patient Position: Sitting, Cuff Size: Normal)   Pulse 96   Temp (!) 97.5 F (36.4 C) (Temporal)   Resp 16   Ht 5\' 10"  (1.778 m)   Wt 215 lb 8 oz (97.8 kg)   SpO2 97%   BMI 30.92 kg/m   General: Well developed, NAD, BMI noted Neck: No  thyromegaly  HEENT:  Normocephalic . Face symmetric, atraumatic Lungs:  CTA B Normal respiratory effort, no intercostal retractions, no accessory muscle use. Heart: RRR,  no murmur.  No pretibial edema bilaterally  Abdomen:  Not distended, soft, non-tender. No rebound or rigidity.   Skin: Exposed areas without rash. Not pale. Not jaundice Neurologic:  alert & oriented X3.  Speech normal, gait at baseline, assisted by a cane Strength symmetric and appropriate for age.  Psych: Cognition and judgment appear intact.  Cooperative with normal attention span and concentration.  Behavior appropriate. No anxious or depressed appearing.     Assessment      Assessment Hyperglycemia (A1c 5.28 May 2018) HTN High cholesterol h/o HA-- s/p extensive w/u in the past. Has seen Dr Maureen Chatters and neuro @ [facility]   h/o kidney stones , multiple Chronic back pain-- sees pain management, see surgeries Chronic hoarseness: Never seen by ENT Sees dermatology  Abnormal EKG: TWI, RBBB, see note from 05-2018   PLAN Here for CPX Hyperglycemia: Checking labs HTN: On losartan, controlled. High cholesterol: Checking labs Neck pain: See last visit, resolved RTC 8 months

## 2019-05-02 NOTE — Progress Notes (Signed)
Pre visit review using our clinic review tool, if applicable. No additional management support is needed unless otherwise documented below in the visit note. 

## 2019-05-02 NOTE — Patient Instructions (Addendum)
Please schedule Medicare Wellness with Glenard Haring.   GO TO THE LAB : Get the blood work     GO TO THE FRONT DESK Schedule your next appointment   for a checkup in 8 months  Check your blood pressure twice a month BP GOAL is between 110/65 and  135/85. If it is consistently higher or lower, let me know

## 2019-05-03 NOTE — Assessment & Plan Note (Signed)
Here for CPX Hyperglycemia: Checking labs HTN: On losartan, controlled. High cholesterol: Checking labs Neck pain: See last visit, resolved RTC 8 months

## 2019-05-03 NOTE — Assessment & Plan Note (Signed)
-  Td 2016 - pnm 23: 2011 - Prevnar 2016 - shingrex: cost is an issue  -  flu shot today -[facility]:  Never had a colonoscopy, again d/w pt: not interested  -Prostate cancer screening: DRE done acutely for a UTI 01-2019, normal.  Subsequently saw urology.  -Diet: Discussed -Exercise :very limited   -Labs: LFTs, FLP, A1c, TSH

## 2019-06-07 DIAGNOSIS — M4716 Other spondylosis with myelopathy, lumbar region: Secondary | ICD-10-CM | POA: Diagnosis not present

## 2019-06-07 DIAGNOSIS — M5431 Sciatica, right side: Secondary | ICD-10-CM | POA: Diagnosis not present

## 2019-07-14 ENCOUNTER — Other Ambulatory Visit: Payer: Self-pay | Admitting: Internal Medicine

## 2019-08-02 ENCOUNTER — Ambulatory Visit: Payer: PPO | Attending: Internal Medicine

## 2019-08-02 DIAGNOSIS — Z23 Encounter for immunization: Secondary | ICD-10-CM

## 2019-08-02 NOTE — Progress Notes (Signed)
   Covid-19 Vaccination Clinic  Name:  Bradley Navarro    MRN: [id number] DOB: 10-29-1940  08/02/2019  [name] was observed post Covid-19 immunization for 15 minutes without incidence. He was provided with Vaccine Information Sheet and instruction to access the V-Safe system.   [name] was instructed to call 911 with any severe reactions post vaccine: Marland Kitchen Difficulty breathing  . Swelling of your face and throat  . A fast heartbeat  . A bad rash all over your body  . Dizziness and weakness    Immunizations Administered    Name Date Dose VIS Date Route   Pfizer COVID-19 Vaccine 08/02/2019 10:54 AM 0.3 mL 05/31/2019 Intramuscular   Manufacturer: [place]   Lot: [id number]   [place]: [id number]

## 2019-08-09 DIAGNOSIS — M5431 Sciatica, right side: Secondary | ICD-10-CM | POA: Diagnosis not present

## 2019-08-09 DIAGNOSIS — M4807 Spinal stenosis, lumbosacral region: Secondary | ICD-10-CM | POA: Diagnosis not present

## 2019-08-09 DIAGNOSIS — G894 Chronic pain syndrome: Secondary | ICD-10-CM | POA: Diagnosis not present

## 2019-08-09 DIAGNOSIS — Z79899 Other long term (current) drug therapy: Secondary | ICD-10-CM | POA: Diagnosis not present

## 2019-08-09 DIAGNOSIS — Z79891 Long term (current) use of opiate analgesic: Secondary | ICD-10-CM | POA: Diagnosis not present

## 2019-08-15 ENCOUNTER — Telehealth: Payer: Self-pay | Admitting: Internal Medicine

## 2019-08-15 NOTE — Progress Notes (Signed)
Error.     Raynicia Dukes [employer] Scheduler

## 2019-08-24 ENCOUNTER — Ambulatory Visit: Payer: PPO | Attending: Internal Medicine

## 2019-08-24 DIAGNOSIS — Z23 Encounter for immunization: Secondary | ICD-10-CM | POA: Insufficient documentation

## 2019-08-24 NOTE — Progress Notes (Signed)
   Covid-19 Vaccination Clinic  Name:  Bradley Navarro    MRN: [id number] DOB: 1941/03/21  08/24/2019  Bradley Navarro was observed post Covid-19 immunization for 15 minutes without incident. He was provided with Vaccine Information Sheet and instruction to access the V-Safe system.   Bradley Navarro was instructed to call 911 with any severe reactions post vaccine: Marland Kitchen Difficulty breathing  . Swelling of face and throat  . A fast heartbeat  . A bad rash all over body  . Dizziness and weakness   Immunizations Administered    Name Date Dose VIS Date Route   Pfizer COVID-19 Vaccine 08/24/2019  2:39 PM 0.3 mL 05/31/2019 Intramuscular   Manufacturer: [place]   Lot: [id number]   [place]: [id number]

## 2019-10-02 NOTE — Progress Notes (Signed)
Nurse connected with patient 10/03/19 at 11:00 AM EDT by a telephone enabled telemedicine application and verified that I am speaking with the correct person using two identifiers. Patient stated full name and DOB. Patient gave permission to continue with virtual visit. Patient's location was at home and Nurse's location was at [place] office.  Subjective:   Bradley Navarro is a 79 y.o. male who presents for Medicare Annual/Subsequent preventive examination.  Collects stamps. Reads 3-4 books per week.  Review of Systems:  Cardiac Risk Factors include: advanced age (>82men, >23 women);hypertension;male gender Home Safety/Smoke Alarms: Feels safe in home. Smoke alarms in place.  Lives w/ wife in 2 story condo.  Does okay w/ stairs. 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   Male:   CCS-   declines  PSA-  Lab Results  Component Value Date   PSA 1.84 06/23/2017   PSA 2.78 05/24/2010       Objective:    Vitals: Unable to assess. This visit is enabled though telemedicine due to Covid 19.   Advanced Directives 10/03/2019 04/20/2015  Does Patient Have a Medical Advance Directive? Yes No  Type of Paramedic of [place];Living will -  Does patient want to make changes to medical advance directive? No - Patient declined -  Copy of [place] in Chart? No - copy requested -  Would patient like information on creating a medical advance directive? - Yes - Scientist, clinical (histocompatibility and immunogenetics) given    Tobacco Social History   Tobacco Use  Smoking Status Former Smoker  Smokeless Tobacco Never Used  Tobacco Comment   quit in the 90, light smoker      Counseling given: Not Answered Comment: quit in the 90, light smoker    Clinical Intake: Pain : No/denies pain      Past Medical History:  Diagnosis Date  . Chronic back pain   . Elevated BP without diagnosis of hypertension 04/24/2017  . History of headache    frontal lobe cluster headaches  . History of  kidney stones   . Post laminectomy syndrome   . Sciatica of right side 2016   S/P L5-S1 revision lam/PSF on 04-10-12 w/ relief of RLE pain post op, MRI 01/2014 shows minimal foraminal stenosis at L4-5 w/o recurrent stenosis at L5-S1   Past Surgical History:  Procedure Laterality Date  . APPENDECTOMY    . LUMBAR DISC SURGERY  2012   L-5, Dr Vennie Homans  . LUMBAR FUSION  2015   rod, plates  . r arm surger     Family History  Problem Relation Age of Onset  . CAD Father 67       MI   . Dementia Mother   . Colon cancer Neg Hx   . Prostate cancer Neg Hx   . Diabetes Neg Hx    Social History   Socioeconomic History  . Marital status: Married    Spouse name: Not on file  . Number of children: 2  . Years of education: Not on file  . Highest education level: Not on file  Occupational History  . Occupation: retired  Tobacco Use  . Smoking status: Former Research scientist (life sciences)  . Smokeless tobacco: Never Used  . Tobacco comment: quit in the 90, light smoker   Substance and Sexual Activity  . Alcohol use: No    Alcohol/week: 0.0 standard drinks  . Drug use: No  . Sexual activity: Not on file  Other Topics Concern  . Not on file  Social History Narrative   Household: pt, wife, 2 cats   children 2 and 2 step daughters, has g-kids   Investment banker, operational of Radio broadcast assistant Strain: Low Risk   . Difficulty of Paying Living Expenses: Not hard at all  Food Insecurity: No Food Insecurity  . Worried About Charity fundraiser in the Last Year: Never true  . Ran Out of Food in the Last Year: Never true  Transportation Needs: No Transportation Needs  . Lack of Transportation (Medical): No  . Lack of Transportation (Non-Medical): No  Physical Activity:   . Days of Exercise per Week:   . Minutes of Exercise per Session:   Stress:   . Feeling of Stress :   Social Connections:   . Frequency of Communication with Friends and Family:   . Frequency of Social Gatherings with Friends and Family:    . Attends Religious Services:   . Active Member of Clubs or Organizations:   . Attends Archivist Meetings:   Marland Kitchen Marital Status:     Outpatient Encounter Medications as of 10/03/2019  Medication Sig  . atorvastatin (LIPITOR) 10 MG tablet Take 1 tablet (10 mg total) by mouth at bedtime.  Marland Kitchen HYDROcodone-acetaminophen (NORCO) 10-325 MG per tablet Take 1 tablet by mouth every 6 (six) hours as needed.  Marland Kitchen losartan (COZAAR) 100 MG tablet Take 1 tablet (100 mg total) by mouth daily.  Marland Kitchen morphine (MS CONTIN) 30 MG 12 hr tablet Take 30 mg by mouth in the morning and at bedtime.   . Multiple Vitamin (MULITIVITAMIN WITH MINERALS) TABS Take 1 tablet by mouth daily.  Marland Kitchen omeprazole (PRILOSEC) 20 MG capsule Take 1 capsule (20 mg total) by mouth 2 (two) times daily.  . tamsulosin (FLOMAX) 0.4 MG CAPS capsule Take 0.4 mg by mouth daily.  . tizanidine (ZANAFLEX) 2 MG capsule Take 2-4 mg by mouth every 8 (eight) hours as needed. Not to exceed 3 doses in 24 hours.  . [DISCONTINUED] predniSONE (DELTASONE) 10 MG tablet 4 tablets x 2 days, 3 tabs x 2 days, 2 tabs x 2 days, 1 tab x 2 days (Patient not taking: Reported on 05/02/2019)   No facility-administered encounter medications on file as of 10/03/2019.    Activities of Daily Living In your present state of health, do you have any difficulty performing the following activities: 10/03/2019 05/02/2019  Hearing? N N  Vision? N N  Difficulty concentrating or making decisions? N N  Walking or climbing stairs? N N  Dressing or bathing? N N  Doing errands, shopping? - Scientist, forensic and eating ? N -  Using the Toilet? N -  In the past six months, have you accidently leaked urine? N -  Do you have problems with loss of bowel control? N -  Managing your Medications? N -  Managing your Finances? N -  Housekeeping or managing your Housekeeping? N -  Some recent data might be hidden    Patient Care Team: Colon Branch, MD as PCP - General Czinsky,  Stephani Police, [place] as Physician Assistant (Orthopedic Surgery) Zonia Kief, MD as Consulting Physician (Rehabilitation) Lucas Mallow, MD as Consulting Physician (Urology)   Assessment:   This is a routine wellness examination for Tadarius. Physical assessment deferred to PCP.   Exercise Activities and Dietary recommendations Current Exercise Habits: The patient does not participate in regular exercise at present, Exercise limited by: None identified Diet (meal preparation, eat out, water intake,  caffeinated beverages, dairy products, fruits and vegetables): 24 hr recall Breakfast: bacon and cheese biscuit Lunch: spaghetti Dinner:  yogurt  Goals    . Continue eating healthy and remain active.  (pt-stated)    . DIET - INCREASE WATER INTAKE       Fall Risk Fall Risk  10/03/2019 05/02/2019 04/30/2018 04/24/2017 04/19/2016  Falls in the past year? 0 0 0 No Yes  Number falls in past yr: 0 - - - 2 or more  Injury with Fall? 0 - - - No  Risk Factor Category  - - - - -  Risk for fall due to : - - - - -  Risk for fall due to: Comment - - - - -  Follow up Education provided;Falls prevention discussed Falls evaluation completed Falls evaluation completed;Education provided;Falls prevention discussed - -   Depression Screen PHQ 2/9 Scores 10/03/2019 05/02/2019 04/30/2018 04/24/2017  PHQ - 2 Score 0 0 0 0    Cognitive Function Ad8 score reviewed for issues:  Issues making decisions:no  Less interest in hobbies / activities:no  Repeats questions, stories (family complaining):no  Trouble using ordinary gadgets (microwave, computer, phone):no  Forgets the month or year: no  Mismanaging finances: no  Remembering appts:no  Daily problems with thinking and/or memory:no Ad8 score is=0     MMSE - Mini Mental State Exam 04/20/2015  Orientation to time 5  Orientation to Place 5  Registration 3  Attention/ Calculation 5  Recall 3  Language- name 2 objects 2  Language- repeat 1   Language- follow 3 step command 3  Language- read & follow direction 1  Write a sentence 1  Copy design 1  Total score 30        Immunization History  Administered Date(s) Administered  . Fluad Quad(high Dose 65+) 05/02/2019  . Influenza Split 03/30/2011, 03/28/2012  . Influenza Whole 03/02/2010  . Influenza, High Dose Seasonal PF 03/17/2015, 04/19/2016, 03/16/2017, 03/26/2018  . Influenza,inj,Quad PF,6+ Mos 03/20/2013, 03/25/2014  . PFIZER SARS-COV-2 Vaccination 08/02/2019, 08/24/2019  . Pneumococcal Conjugate-13 04/17/2015  . Pneumococcal Polysaccharide-23 03/02/2010  . Td 04/17/2015    Screening Tests Health Maintenance  Topic Date Due  . INFLUENZA VACCINE  01/19/2020  . TETANUS/TDAP  04/16/2025  . PNA vac Low Risk Adult  Completed       Plan:    Please schedule your next medicare wellness visit with me in 1 yr.  Continue to eat heart healthy diet (full of fruits, vegetables, whole grains, lean protein, water--limit salt, fat, and sugar intake) and increase physical activity as tolerated.  Continue doing brain stimulating activities (puzzles, reading, adult coloring books, staying active) to keep memory sharp.   Bring a copy of your living will and/or healthcare power of attorney to your next office visit.   I have personally reviewed and noted the following in the patient's chart:   . Medical and social history . Use of alcohol, tobacco or illicit drugs  . Current medications and supplements . Functional ability and status . Nutritional status . Physical activity . Advanced directives . List of other physicians . Hospitalizations, surgeries, and ER visits in previous 12 months . Vitals . Screenings to include cognitive, depression, and falls . Referrals and appointments  In addition, I have reviewed and discussed with patient certain preventive protocols, quality metrics, and best practice recommendations. A written personalized care plan for preventive  services as well as general preventive health recommendations were provided to patient.     Vevelyn Royals,  Laurette Schimke, RN  10/03/2019

## 2019-10-03 ENCOUNTER — Other Ambulatory Visit: Payer: Self-pay

## 2019-10-03 ENCOUNTER — Encounter: Payer: Self-pay | Admitting: *Deleted

## 2019-10-03 ENCOUNTER — Ambulatory Visit (INDEPENDENT_AMBULATORY_CARE_PROVIDER_SITE_OTHER): Payer: PPO | Admitting: *Deleted

## 2019-10-03 DIAGNOSIS — Z0000 Encounter for general adult medical examination without abnormal findings: Secondary | ICD-10-CM

## 2019-10-03 NOTE — Patient Instructions (Signed)
Please schedule your next medicare wellness visit with me in 1 yr.  Continue to eat heart healthy diet (full of fruits, vegetables, whole grains, lean protein, water--limit salt, fat, and sugar intake) and increase physical activity as tolerated.  Continue doing brain stimulating activities (puzzles, reading, adult coloring books, staying active) to keep memory sharp.   Bring a copy of your living will and/or healthcare power of attorney to your next office visit.   Bradley Navarro , Thank you for taking time to come for your Medicare Wellness Visit. I appreciate your ongoing commitment to your health goals. Please review the following plan we discussed and let me know if I can assist you in the future.   These are the goals we discussed: Goals    . Continue eating healthy and remain active.  (pt-stated)    . DIET - INCREASE WATER INTAKE       This is a list of the screening recommended for you and due dates:  Health Maintenance  Topic Date Due  . Flu Shot  01/19/2020  . Tetanus Vaccine  04/16/2025  . Pneumonia vaccines  Completed    Preventive Care 63 Years and Older, Male Preventive care refers to lifestyle choices and visits with your health care provider that can promote health and wellness. This includes:  A yearly physical exam. This is also called an annual well check.  Regular dental and eye exams.  Immunizations.  Screening for certain conditions.  Healthy lifestyle choices, such as diet and exercise. What can I expect for my preventive care visit? Physical exam Your health care provider will check:  Height and weight. These may be used to calculate body mass index (BMI), which is a measurement that tells if you are at a healthy weight.  Heart rate and blood pressure.  Your skin for abnormal spots. Counseling Your health care provider may ask you questions about:  Alcohol, tobacco, and drug use.  Emotional well-being.  Home and relationship well-being.   Sexual activity.  Eating habits.  History of falls.  Memory and ability to understand (cognition).  Work and work Statistician. What immunizations do I need?  Influenza (flu) vaccine  This is recommended every year. Tetanus, diphtheria, and pertussis (Tdap) vaccine  You may need a Td booster every 10 years. Varicella (chickenpox) vaccine  You may need this vaccine if you have not already been vaccinated. Zoster (shingles) vaccine  You may need this after age 83. Pneumococcal conjugate (PCV13) vaccine  One dose is recommended after age 38. Pneumococcal polysaccharide (PPSV23) vaccine  One dose is recommended after age 10. Measles, mumps, and rubella (MMR) vaccine  You may need at least one dose of MMR if you were born in 1957 or later. You may also need a second dose. Meningococcal conjugate (MenACWY) vaccine  You may need this if you have certain conditions. Hepatitis A vaccine  You may need this if you have certain conditions or if you travel or work in places where you may be exposed to hepatitis A. Hepatitis B vaccine  You may need this if you have certain conditions or if you travel or work in places where you may be exposed to hepatitis B. Haemophilus influenzae type b (Hib) vaccine  You may need this if you have certain conditions. You may receive vaccines as individual doses or as more than one vaccine together in one shot (combination vaccines). Talk with your health care provider about the risks and benefits of combination vaccines. What tests do  I need? Blood tests  Lipid and cholesterol levels. These may be checked every 5 years, or more frequently depending on your overall health.  Hepatitis C test.  Hepatitis B test. Screening  Lung cancer screening. You may have this screening every year starting at age 100 if you have a 30-pack-year history of smoking and currently smoke or have quit within the past 15 years.  Colorectal cancer screening. All  adults should have this screening starting at age 39 and continuing until age 75. Your health care provider may recommend screening at age 70 if you are at increased risk. You will have tests every 1-10 years, depending on your results and the type of screening test.  Prostate cancer screening. Recommendations will vary depending on your family history and other risks.  Diabetes screening. This is done by checking your blood sugar (glucose) after you have not eaten for a while (fasting). You may have this done every 1-3 years.  Abdominal aortic aneurysm (AAA) screening. You may need this if you are a current or former smoker.  Sexually transmitted disease (STD) testing. Follow these instructions at home: Eating and drinking  Eat a diet that includes fresh fruits and vegetables, whole grains, lean protein, and low-fat dairy products. Limit your intake of foods with high amounts of sugar, saturated fats, and salt.  Take vitamin and mineral supplements as recommended by your health care provider.  Do not drink alcohol if your health care provider tells you not to drink.  If you drink alcohol: ? Limit how much you have to 0-2 drinks a day. ? Be aware of how much alcohol is in your drink. In the [place] one drink equals one 12 oz bottle of beer (355 mL), one 5 oz glass of wine (148 mL), or one 1 oz glass of hard liquor (44 mL). Lifestyle  Take daily care of your teeth and gums.  Stay active. Exercise for at least 30 minutes on 5 or more days each week.  Do not use any products that contain nicotine or tobacco, such as cigarettes, e-cigarettes, and chewing tobacco. If you need help quitting, ask your health care provider.  If you are sexually active, practice safe sex. Use a condom or other form of protection to prevent STIs (sexually transmitted infections).  Talk with your health care provider about taking a low-dose aspirin or statin. What's next?  Visit your health care provider once a  year for a well check visit.  Ask your health care provider how often you should have your eyes and teeth checked.  Stay up to date on all vaccines. This information is not intended to replace advice given to you by your health care provider. Make sure you discuss any questions you have with your health care provider. Document Revised: 05/31/2018 Document Reviewed: 05/31/2018 Elsevier Patient Education  2020 [employer].

## 2019-10-11 DIAGNOSIS — M5431 Sciatica, right side: Secondary | ICD-10-CM | POA: Diagnosis not present

## 2019-10-11 DIAGNOSIS — M4807 Spinal stenosis, lumbosacral region: Secondary | ICD-10-CM | POA: Diagnosis not present

## 2019-12-13 DIAGNOSIS — M5431 Sciatica, right side: Secondary | ICD-10-CM | POA: Diagnosis not present

## 2019-12-13 DIAGNOSIS — M4807 Spinal stenosis, lumbosacral region: Secondary | ICD-10-CM | POA: Diagnosis not present

## 2019-12-31 ENCOUNTER — Encounter: Payer: Self-pay | Admitting: Internal Medicine

## 2019-12-31 ENCOUNTER — Other Ambulatory Visit: Payer: Self-pay

## 2019-12-31 ENCOUNTER — Ambulatory Visit (INDEPENDENT_AMBULATORY_CARE_PROVIDER_SITE_OTHER): Payer: PPO | Admitting: Internal Medicine

## 2019-12-31 VITALS — BP 148/80 | HR 76 | Temp 98.3°F | Resp 18 | Ht 70.0 in | Wt 220.1 lb

## 2019-12-31 DIAGNOSIS — M545 Low back pain: Secondary | ICD-10-CM

## 2019-12-31 DIAGNOSIS — R739 Hyperglycemia, unspecified: Secondary | ICD-10-CM

## 2019-12-31 DIAGNOSIS — I10 Essential (primary) hypertension: Secondary | ICD-10-CM

## 2019-12-31 DIAGNOSIS — G8929 Other chronic pain: Secondary | ICD-10-CM

## 2019-12-31 LAB — CBC WITH DIFFERENTIAL/PLATELET
Basophils Absolute: 0 10*3/uL (ref 0.0–0.1)
Basophils Relative: 0.7 % (ref 0.0–3.0)
Eosinophils Absolute: 0.1 10*3/uL (ref 0.0–0.7)
Eosinophils Relative: 1.4 % (ref 0.0–5.0)
HCT: 39.8 % (ref 39.0–52.0)
Hemoglobin: 13.9 g/dL (ref 13.0–17.0)
Lymphocytes Relative: 33.5 % (ref 12.0–46.0)
Lymphs Abs: 1.9 10*3/uL (ref 0.7–4.0)
MCHC: 34.9 g/dL (ref 30.0–36.0)
MCV: 92.8 fl (ref 78.0–100.0)
Monocytes Absolute: 0.3 10*3/uL (ref 0.1–1.0)
Monocytes Relative: 6.1 % (ref 3.0–12.0)
Neutro Abs: 3.3 10*3/uL (ref 1.4–7.7)
Neutrophils Relative %: 58.3 % (ref 43.0–77.0)
Platelets: 201 10*3/uL (ref 150.0–400.0)
RBC: 4.29 Mil/uL (ref 4.22–5.81)
RDW: 12.8 % (ref 11.5–15.5)
WBC: 5.7 10*3/uL (ref 4.0–10.5)

## 2019-12-31 LAB — BASIC METABOLIC PANEL
BUN: 14 mg/dL (ref 6–23)
CO2: 28 mEq/L (ref 19–32)
Calcium: 9.3 mg/dL (ref 8.4–10.5)
Chloride: 103 mEq/L (ref 96–112)
Creatinine, Ser: 0.81 mg/dL (ref 0.40–1.50)
GFR: 92.2 mL/min (ref >60.00)
Glucose, Bld: 159 mg/dL — ABNORMAL HIGH (ref 70–99)
Potassium: 4.2 mEq/L (ref 3.5–5.1)
Sodium: 139 mEq/L (ref 135–145)

## 2019-12-31 NOTE — Assessment & Plan Note (Signed)
Hyperglycemia: Last A1c stable. HTN: BP today is 148/80, at home is consistently in the 130s.  Continue losartan, check a BMP CBC Chronic back pain: Stable, sees pain management. Preventive care: Had Covid vaccinations. RTC CPX November 2021

## 2019-12-31 NOTE — Progress Notes (Signed)
Subjective:    Patient ID: Bradley Navarro, male    DOB: [birth year], 79 y.o.   MRN: [id number]  DOS:  12/31/2019 Type of visit - description: Follow-up Since the last office visit he is stable. Today we talk about hypertension. He sees pain management regularly ands things are stable  BP Readings from Last 3 Encounters:  12/31/19 (!) 148/80  05/02/19 136/61  04/18/19 (!) 144/73     Review of Systems Denies chest pain, difficulty breathing or lower extremity edema  Past Medical History:  Diagnosis Date  . Chronic back pain   . Elevated BP without diagnosis of hypertension 04/24/2017  . History of headache    frontal lobe cluster headaches  . History of kidney stones   . Post laminectomy syndrome   . Sciatica of right side 2016   S/P L5-S1 revision lam/PSF on 04-10-12 w/ relief of RLE pain post op, MRI 01/2014 shows minimal foraminal stenosis at L4-5 w/o recurrent stenosis at L5-S1    Past Surgical History:  Procedure Laterality Date  . APPENDECTOMY    . LUMBAR DISC SURGERY  2012   L-5, Dr Vennie Homans  . LUMBAR FUSION  2015   rod, plates  . r arm surger      Allergies as of 12/31/2019      Reactions   Penicillins Swelling   redness diffusely 1962      Medication List       Accurate as of December 31, 2019  9:48 PM. If you have any questions, ask your nurse or doctor.        atorvastatin 10 MG tablet Commonly known as: LIPITOR Take 1 tablet (10 mg total) by mouth at bedtime.   HYDROcodone-acetaminophen 10-325 MG tablet Commonly known as: NORCO Take 1 tablet by mouth every 6 (six) hours as needed.   losartan 100 MG tablet Commonly known as: COZAAR Take 1 tablet (100 mg total) by mouth daily.   morphine 30 MG 12 hr tablet Commonly known as: MS CONTIN Take 30 mg by mouth in the morning and at bedtime.   multivitamin with minerals Tabs tablet Take 1 tablet by mouth daily.   omeprazole 20 MG capsule Commonly known as: PRILOSEC Take 1 capsule (20 mg total)  by mouth 2 (two) times daily.   tamsulosin 0.4 MG Caps capsule Commonly known as: FLOMAX Take 0.4 mg by mouth daily.   tizanidine 2 MG capsule Commonly known as: ZANAFLEX Take 2-4 mg by mouth every 8 (eight) hours as needed. Not to exceed 3 doses in 24 hours.          Objective:   Physical Exam BP (!) 148/80 (BP Location: Left Arm, Patient Position: Sitting, Cuff Size: Normal)   Pulse 76   Temp 98.3 F (36.8 C) (Oral)   Resp 18   Ht 5\' 10"  (1.778 m)   Wt 220 lb 2 oz (99.8 kg)   SpO2 97%   BMI 31.58 kg/m  General:   Well developed, NAD, BMI noted. HEENT:  Normocephalic . Face symmetric, atraumatic Lungs:  CTA B Normal respiratory effort, no intercostal retractions, no accessory muscle use. Heart: RRR,  no murmur.  Lower extremities: no pretibial edema bilaterally  Skin: Not pale. Not jaundice Neurologic:  alert & oriented X3.  Speech normal, gait somewhat limited by pain, uses a cane Psych--  Cognition and judgment appear intact.  Cooperative with normal attention span and concentration.  Behavior appropriate. No anxious or depressed appearing.  Assessment     Assessment Hyperglycemia (A1c 5.28 May 2018) HTN High cholesterol h/o HA-- s/p extensive w/u in the past. Has seen Dr Maureen Chatters and neuro @ [facility]   h/o kidney stones , multiple Chronic back pain-- sees pain management, see surgeries Chronic hoarseness: Never seen by ENT Sees dermatology  Abnormal EKG: TWI, RBBB, see note from 05-2018   PLAN Hyperglycemia: Last A1c stable. HTN: BP today is 148/80, at home is consistently in the 130s.  Continue losartan, check a BMP CBC Chronic back pain: Stable, sees pain management. Preventive care: Had Covid vaccinations. RTC CPX November 2021  This visit occurred during the SARS-CoV-2 public health emergency.  Safety protocols were in place, including screening questions prior to the visit, additional usage of staff PPE, and extensive cleaning of exam  room while observing appropriate contact time as indicated for disinfecting solutions.

## 2019-12-31 NOTE — Patient Instructions (Addendum)
Check the  blood pressure slowly BP GOAL is between 110/65 and  135/85. If it is consistently higher or lower, let me know  GO TO THE LAB : Get the blood work     Bradley Navarro, [place] back for a physical exam by November 2021

## 2019-12-31 NOTE — Progress Notes (Signed)
Pre visit review using our clinic review tool, if applicable. No additional management support is needed unless otherwise documented below in the visit note. 

## 2020-01-19 DIAGNOSIS — C4492 Squamous cell carcinoma of skin, unspecified: Secondary | ICD-10-CM

## 2020-01-19 HISTORY — DX: Squamous cell carcinoma of skin, unspecified: C44.92

## 2020-02-12 DIAGNOSIS — L570 Actinic keratosis: Secondary | ICD-10-CM | POA: Diagnosis not present

## 2020-02-12 DIAGNOSIS — C44629 Squamous cell carcinoma of skin of left upper limb, including shoulder: Secondary | ICD-10-CM | POA: Diagnosis not present

## 2020-02-12 DIAGNOSIS — D485 Neoplasm of uncertain behavior of skin: Secondary | ICD-10-CM | POA: Diagnosis not present

## 2020-02-12 DIAGNOSIS — C44320 Squamous cell carcinoma of skin of unspecified parts of face: Secondary | ICD-10-CM | POA: Diagnosis not present

## 2020-02-21 DIAGNOSIS — G894 Chronic pain syndrome: Secondary | ICD-10-CM | POA: Diagnosis not present

## 2020-02-21 DIAGNOSIS — M5431 Sciatica, right side: Secondary | ICD-10-CM | POA: Diagnosis not present

## 2020-02-21 DIAGNOSIS — Z79891 Long term (current) use of opiate analgesic: Secondary | ICD-10-CM | POA: Diagnosis not present

## 2020-02-21 DIAGNOSIS — M4807 Spinal stenosis, lumbosacral region: Secondary | ICD-10-CM | POA: Diagnosis not present

## 2020-02-21 DIAGNOSIS — Z79899 Other long term (current) drug therapy: Secondary | ICD-10-CM | POA: Diagnosis not present

## 2020-02-28 DIAGNOSIS — C4492 Squamous cell carcinoma of skin, unspecified: Secondary | ICD-10-CM | POA: Insufficient documentation

## 2020-03-19 DIAGNOSIS — C44629 Squamous cell carcinoma of skin of left upper limb, including shoulder: Secondary | ICD-10-CM | POA: Diagnosis not present

## 2020-03-24 DIAGNOSIS — Z5189 Encounter for other specified aftercare: Secondary | ICD-10-CM | POA: Diagnosis not present

## 2020-03-26 ENCOUNTER — Other Ambulatory Visit: Payer: Self-pay

## 2020-03-26 ENCOUNTER — Ambulatory Visit (INDEPENDENT_AMBULATORY_CARE_PROVIDER_SITE_OTHER): Payer: PPO

## 2020-03-26 DIAGNOSIS — Z23 Encounter for immunization: Secondary | ICD-10-CM | POA: Diagnosis not present

## 2020-04-09 ENCOUNTER — Other Ambulatory Visit: Payer: Self-pay | Admitting: Internal Medicine

## 2020-04-20 IMAGING — US US SCROTUM W/ DOPPLER COMPLETE
1 series · 14 of 25 positions shown · non-contrast
Comparison: None.

CLINICAL DATA: Right testicular pain and swelling for 3 days.

EXAM:
SCROTAL ULTRASOUND
DOPPLER ULTRASOUND OF THE TESTICLES
TECHNIQUE: Complete ultrasound examination of the testicles, epididymis, and
other scrotal structures was performed. Color and spectral Doppler
ultrasound were also utilized to evaluate blood flow to the
testicles.

[Series 1: us scrotum w/ doppler complete · 14 of 120 slices shown]
[im 1/120]
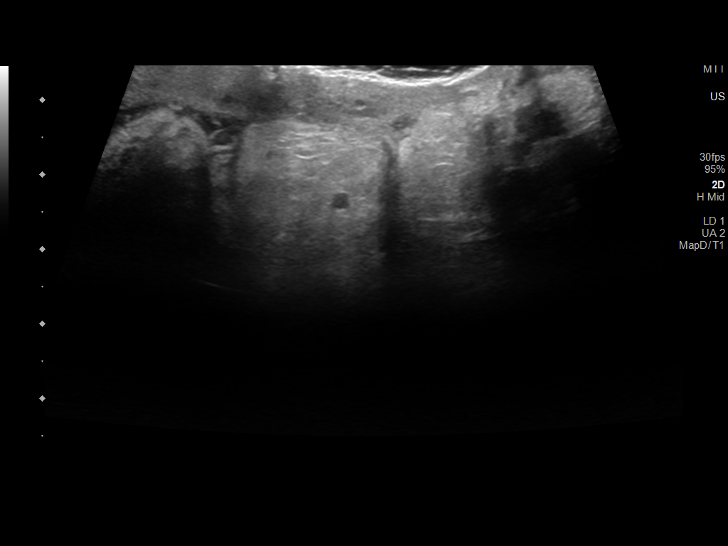
[im 10/120]
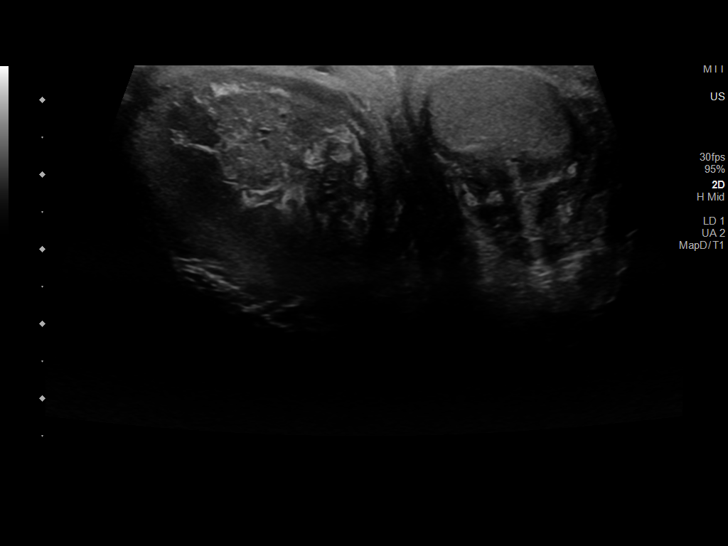
[im 20/120]
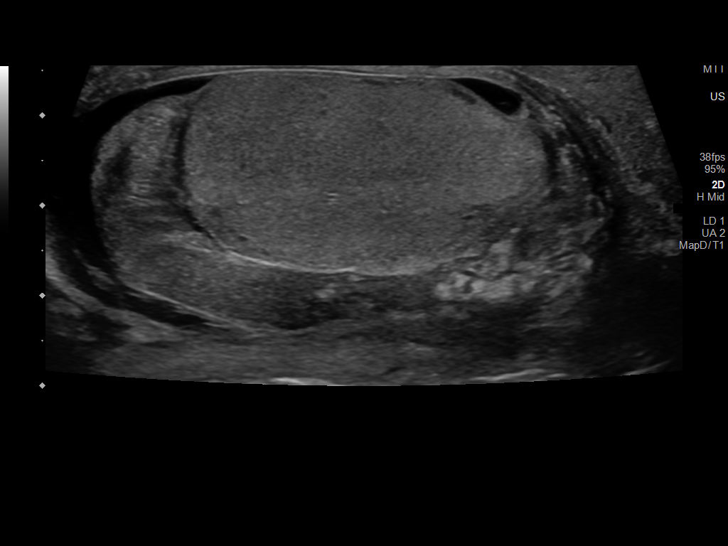
[im 30/120]
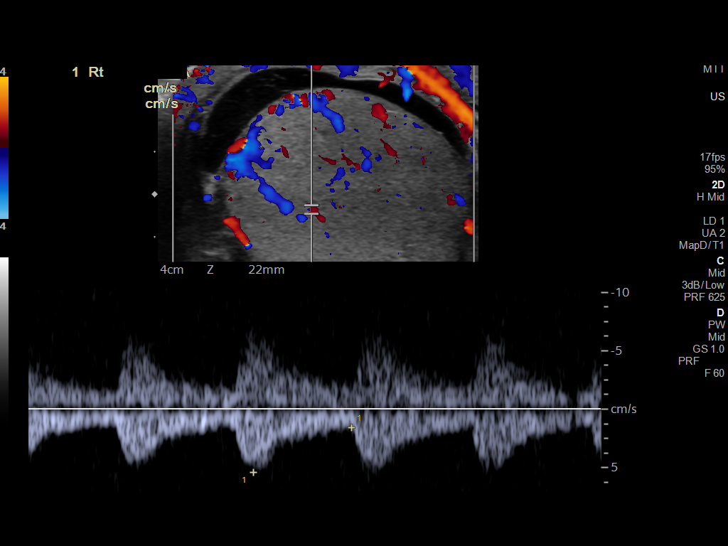
[im 40/120]
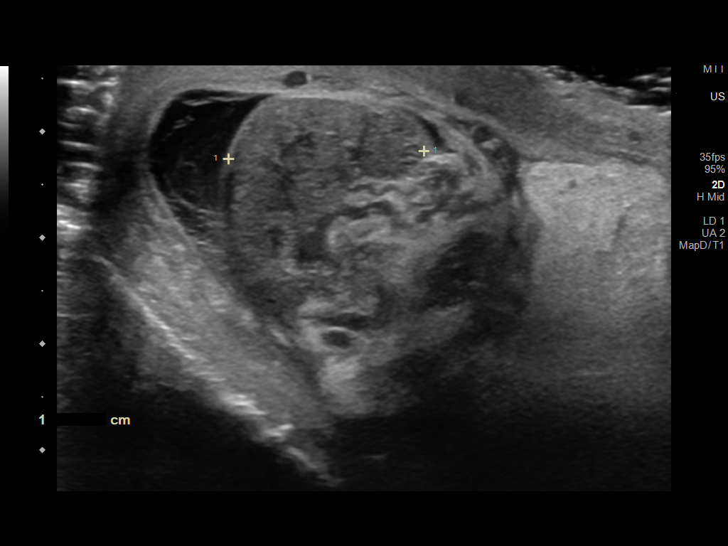
[im 45/120]
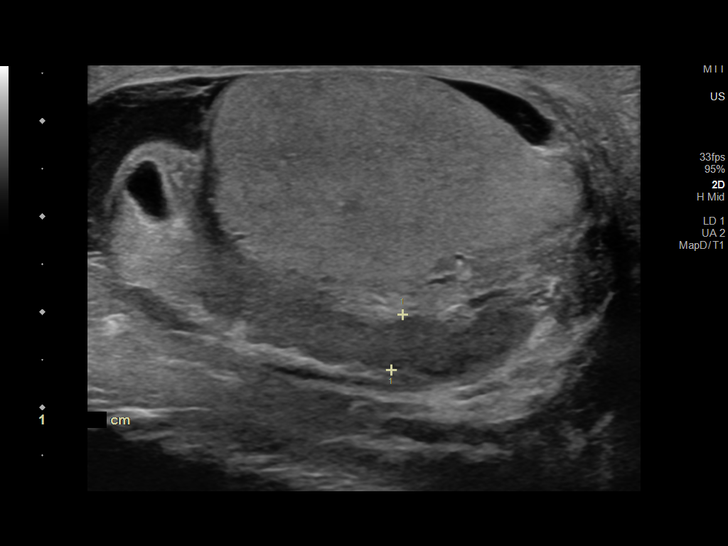
[im 55/120]
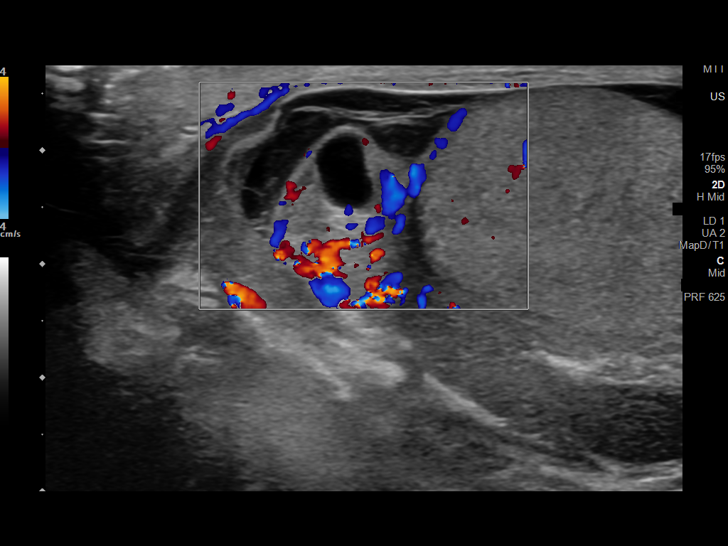
[im 65/120]
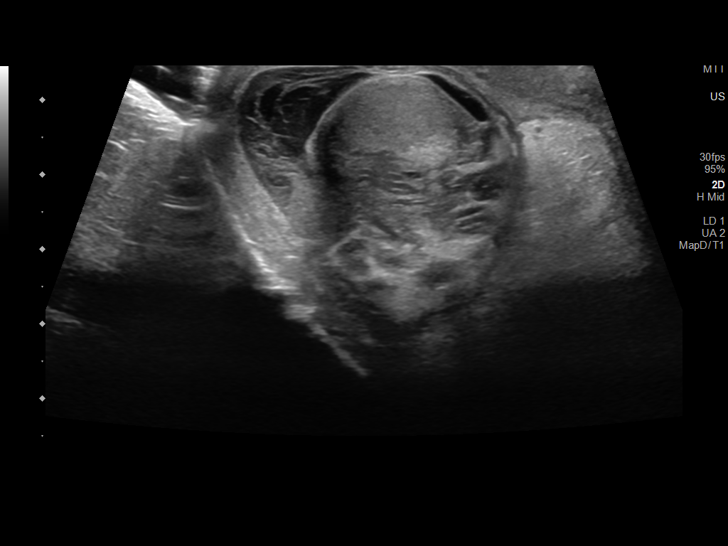
[im 75/120]
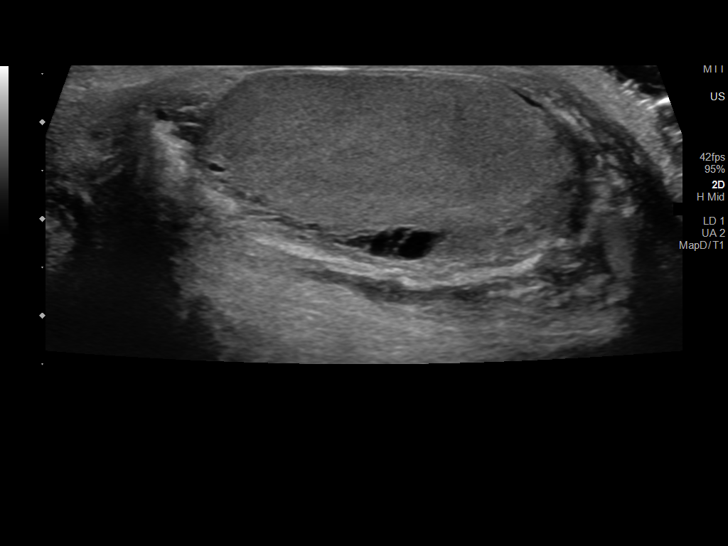
[im 80/120]
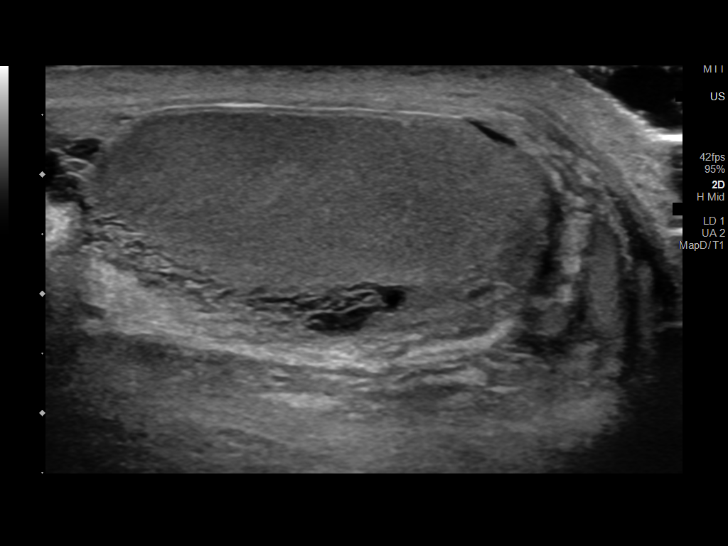
[im 90/120]
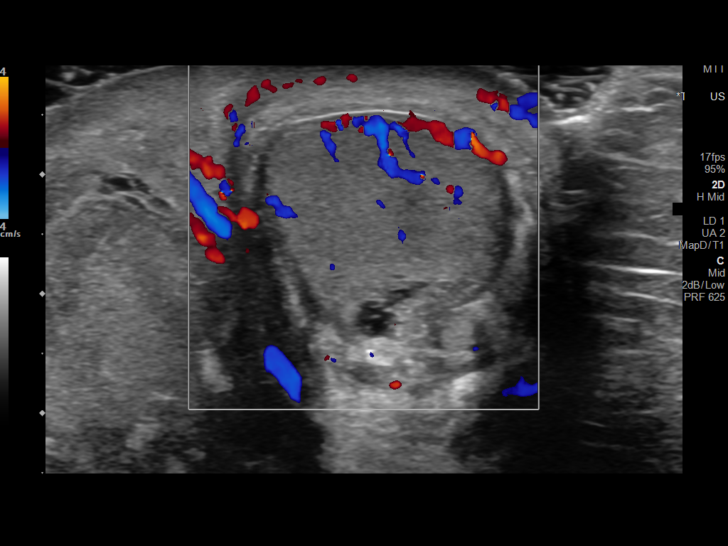
[im 100/120]
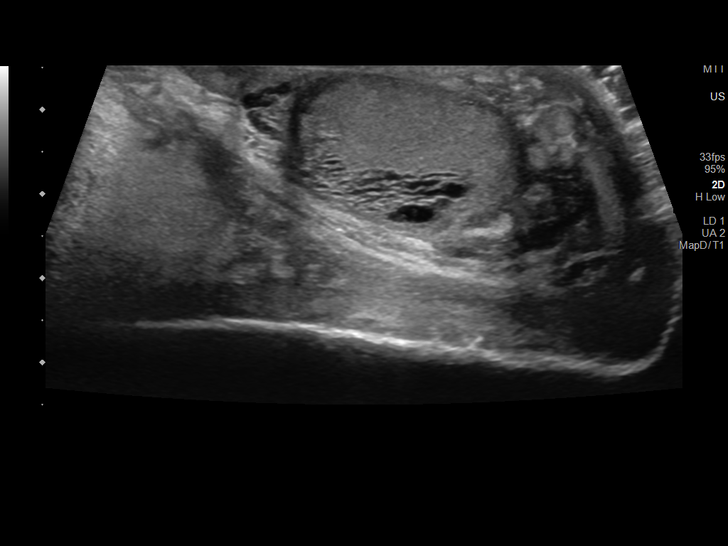
[im 110/120]
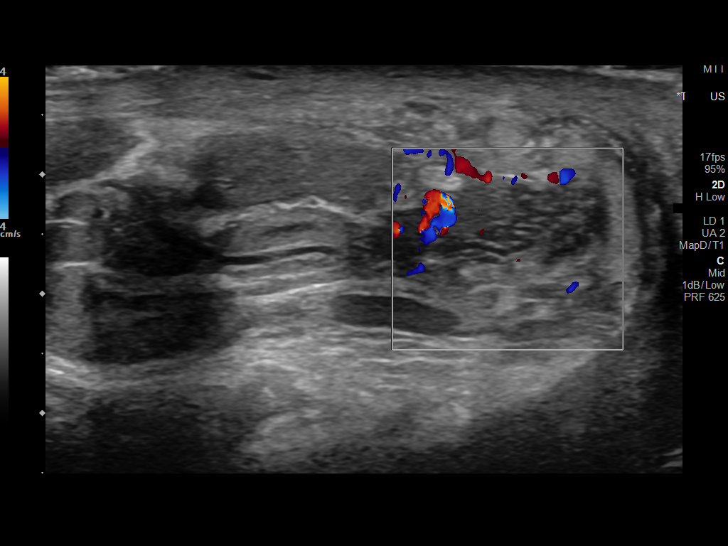
[im 120/120]
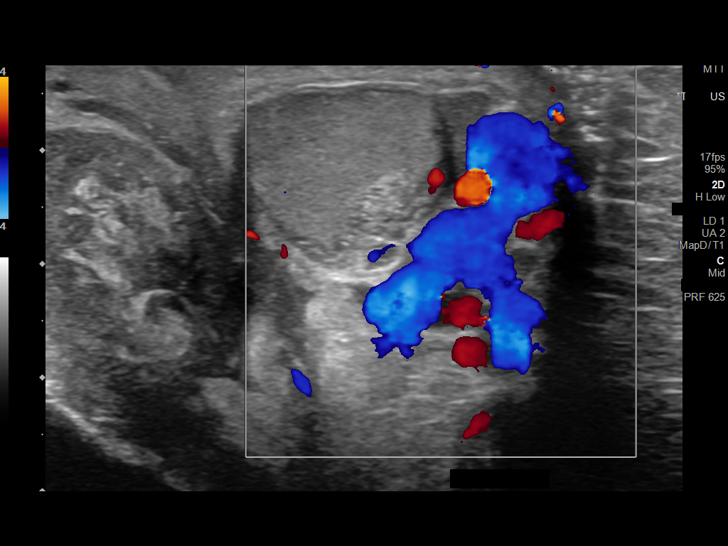

[14 of 25 positions shown; findings below may reference images not displayed]

FINDINGS: Right testicle

Measurements: 4.1 x 2.5 x 3.2 cm. No mass or microlithiasis
visualized.

Left testicle

Measurements: 4.1 x 2.2 x 2.1 cm. No mass or microlithiasis
visualized.

Right epididymis: There is a 0.7 cm cyst in the right epididymal
head.

Left epididymis:  Normal in size and appearance.

Hydrocele:  There is complex septated fluid in the right scrotum.

Varicocele:  There is left varicocele.

Pulsed Doppler interrogation of both testes demonstrates normal low
resistance arterial and venous waveforms bilaterally.
IMPRESSION: No evidence of bilateral orchitis.

No evidence of testicular torsion.

Complex septated fluid identified in the right scrotum, nonspecific.

## 2020-04-22 ENCOUNTER — Other Ambulatory Visit: Payer: Self-pay | Admitting: Internal Medicine

## 2020-04-24 DIAGNOSIS — M5431 Sciatica, right side: Secondary | ICD-10-CM | POA: Diagnosis not present

## 2020-04-24 DIAGNOSIS — M4807 Spinal stenosis, lumbosacral region: Secondary | ICD-10-CM | POA: Diagnosis not present

## 2020-05-14 ENCOUNTER — Other Ambulatory Visit: Payer: Self-pay | Admitting: Internal Medicine

## 2020-05-21 ENCOUNTER — Telehealth: Payer: Self-pay | Admitting: Internal Medicine

## 2020-05-21 NOTE — Progress Notes (Signed)
°  Chronic Care Management   Note  05/21/2020 Name: JALON SQUIER MRN: [id number] DOB: [birth year]  [name] is a 79 y.o. year old male who is a primary care patient of Larose Kells, Alda Berthold, MD. I reached out to [name] by phone today in response to a referral sent by Mr. Mustafa Potts Col[place]'s PCP, Colon Branch, MD.   Mr. Rayner was given information about Chronic Care Management services today including:  1. CCM service includes personalized support from designated clinical staff supervised by his physician, including individualized plan of care and coordination with other care providers 2. 24/7 contact phone numbers for assistance for urgent and routine care needs. 3. Service will only be billed when office clinical staff spend 20 minutes or more in a month to coordinate care. 4. Only one practitioner may furnish and bill the service in a calendar month. 5. The patient may stop CCM services at any time (effective at the end of the month) by phone call to the office staff.   Patient agreed to services and verbal consent obtained.   Follow up plan:   Carley Perdue [employer] Scheduler

## 2020-06-07 ENCOUNTER — Other Ambulatory Visit: Payer: Self-pay | Admitting: Internal Medicine

## 2020-06-17 ENCOUNTER — Other Ambulatory Visit: Payer: Self-pay | Admitting: Internal Medicine

## 2020-06-26 DIAGNOSIS — M5431 Sciatica, right side: Secondary | ICD-10-CM | POA: Diagnosis not present

## 2020-06-26 DIAGNOSIS — M4807 Spinal stenosis, lumbosacral region: Secondary | ICD-10-CM | POA: Diagnosis not present

## 2020-06-29 ENCOUNTER — Ambulatory Visit: Payer: PPO | Admitting: Pharmacist

## 2020-06-29 DIAGNOSIS — E7800 Pure hypercholesterolemia, unspecified: Secondary | ICD-10-CM

## 2020-06-29 DIAGNOSIS — R739 Hyperglycemia, unspecified: Secondary | ICD-10-CM

## 2020-06-29 DIAGNOSIS — I10 Essential (primary) hypertension: Secondary | ICD-10-CM

## 2020-06-29 NOTE — Patient Instructions (Addendum)
Visit Information  Goals Addressed            This Visit's Progress   . Chronic Care Management Pharmacy Care Plan       CARE PLAN ENTRY (see longitudinal plan of care for additional care plan information)  Current Barriers:  . Chronic Disease Management support, education, and care coordination needs related to Hypertension, Hyperlipidemia, Pre-Diabetes, GERD, Chronic Back Pain, BPH   Hypertension BP Readings from Last 3 Encounters:  12/31/19 (!) 148/80  05/02/19 136/61  04/18/19 (!) 144/73   . Pharmacist Clinical Goal(s): o Over the next 90 days, patient will work with PharmD and providers to maintain BP goal <140/90 . Current regimen:  o Losartan 100mg  daily . Interventions: o Discussed BP goal  . Patient self care activities - Over the next 90 days, patient will: o Check BP 2-3 times per week, document, and provide at future appointments o Ensure daily salt intake < 2300 mg/Bradley Navarro  Hyperlipidemia Lab Results  Component Value Date/Time   LDLDIRECT 89.0 05/02/2019 09:12 AM   . Pharmacist Clinical Goal(s): o Over the next 90 days, patient will work with PharmD and providers to maintain LDL goal < 100 . Current regimen:  o Atorvastatin 10mg  daily at bedtime . Interventions: o Discussed LDL goal  . Patient self care activities - Over the next 90 days, patient will: o Maintain cholesterol medication regimen.   Pre-Diabetes Lab Results  Component Value Date/Time   HGBA1C 6.1 05/02/2019 09:12 AM   HGBA1C 5.9 05/31/2018 09:40 AM   . Pharmacist Clinical Goal(s): o Over the next 90 days, patient will work with PharmD and providers to maintain A1c goal <6.5% . Current regimen:  o Diet and exercise management   . Interventions: o Discussed a1c goal  . Patient self care activities - Over the next 90 days, patient will: o Maintain a1c less than 6.5%  Medication management . Pharmacist Clinical Goal(s): o Over the next 90 days, patient will work with PharmD and  providers to maintain optimal medication adherence . [facility] . Interventions o Comprehensive medication review performed. o Continue current medication management strategy . Patient self care activities - Over the next 90 days, patient will: o Focus on medication adherence by filling and taking medications appropriately  o Take medications as prescribed o Report any questions or concerns to PharmD and/or provider(s)  Initial goal documentation        Bradley Navarro was given information about Chronic Care Management services today including:  1. CCM service includes personalized support from designated clinical staff supervised by his physician, including individualized plan of care and coordination with other care providers 2. 24/7 contact phone numbers for assistance for urgent and routine care needs. 3. Standard insurance, coinsurance, copays and deductibles apply for chronic care management only during months in which we provide at least 20 minutes of these services. Most insurances cover these services at 100%, however patients may be responsible for any copay, coinsurance and/or deductible if applicable. This service may help you avoid the need for more expensive face-to-face services. 4. Only one practitioner may furnish and bill the service in a calendar month. 5. The patient may stop CCM services at any time (effective at the end of the month) by phone call to the office staff.  Patient agreed to services and verbal consent obtained.   The patient verbalized understanding of instructions, educational materials, and care plan provided today and agreed to receive a mailed copy of patient instructions, educational  materials, and care plan.  Telephone follow up appointment with pharmacy team member scheduled for: 09/28/20  Bradley Navarro, [facility]

## 2020-06-29 NOTE — Chronic Care Management (AMB) (Signed)
Chronic Care Management Pharmacy  Name: Bradley Navarro  MRN: [id number] DOB: [birth year]  Chief Complaint/ HPI  Bradley Navarro,  80 y.o. , male presents for their Initial CCM visit with the clinical pharmacist via telephone.  PCP : Bradley Branch, MD  Their chronic conditions include: Hypertension, Hyperlipidemia, Pre-Diabetes, GERD, Chronic Back Pain, BPH  Office Visits: 12/31/19: Visit w/ Dr. Larose Navarro - No med changes noted.   Consult Visit: 04/24/20: Pain Management visit w/ Bradley Navarro  03/24/20: Derm visit w/ Dr. Renda Navarro  02/21/20: Pain Management visit w/ Bradley Navarro  02/12/20: Derm visit w/ Dr. Renda Navarro  Medications: Outpatient Encounter Medications as of 06/29/2020  Medication Sig Note  . atorvastatin (LIPITOR) 10 MG tablet Take 1 tablet (10 mg total) by mouth at bedtime.   Marland Kitchen HYDROcodone-acetaminophen (NORCO) 10-325 MG per tablet Take 1 tablet by mouth every 6 (six) hours as needed.   Marland Kitchen losartan (COZAAR) 100 MG tablet Take 1 tablet (100 mg total) by mouth daily.   Marland Kitchen morphine (MS CONTIN) 30 MG 12 hr tablet Take 30 mg by mouth in the morning and at bedtime.    . Multiple Vitamin (MULITIVITAMIN WITH MINERALS) TABS Take 1 tablet by mouth daily.   Marland Kitchen omeprazole (PRILOSEC) 20 MG capsule Take 1 capsule (20 mg total) by mouth 2 (two) times daily. 06/29/2020: Taking once daily  . tamsulosin (FLOMAX) 0.4 MG CAPS capsule Take 0.4 mg by mouth daily.   . tizanidine (ZANAFLEX) 2 MG capsule Take 2-4 mg by mouth every 8 (eight) hours as needed. Not to exceed 3 doses in 24 hours.    No facility-administered encounter medications on file as of 06/29/2020.   SDOH Screenings   Alcohol Screen: Not on file  Depression (PHQ2-9): Low Risk   . PHQ-2 Score: 0  Financial Resource Strain: Low Risk   . Difficulty of Paying Living Expenses: Not very hard  Food Insecurity: No Food Insecurity  . Worried About Charity fundraiser in the Last Year: Never true  . Ran Out of Food in the Last  Year: Never true  Housing: Low Risk   . Last Housing Risk Score: 0  Physical Activity: Not on file  Social Connections: Not on file  Stress: Not on file  Tobacco Use: Medium Risk  . Smoking Tobacco Use: Former Smoker  . Smokeless Tobacco Use: Never Used  Transportation Needs: No Transportation Needs  . Lack of Transportation (Medical): No  . Lack of Transportation (Non-Medical): No     Current Diagnosis/Assessment:  Goals Addressed            This Visit's Progress   . Chronic Care Management Pharmacy Care Plan       CARE PLAN ENTRY (see longitudinal plan of care for additional care plan information)  Current Barriers:  . Chronic Disease Management support, education, and care coordination needs related to Hypertension, Hyperlipidemia, Pre-Diabetes, GERD, Chronic Back Pain, BPH   Hypertension BP Readings from Last 3 Encounters:  12/31/19 (!) 148/80  05/02/19 136/61  04/18/19 (!) 144/73   . Pharmacist Clinical Goal(s): o Over the next 90 days, patient will work with PharmD and providers to maintain BP goal <140/90 . Current regimen:  o Losartan 169m daily . Interventions: o Discussed BP goal  . Patient self care activities - Over the next 90 days, patient will: o Check BP 2-3 times per week, document, and provide at future appointments o Ensure daily salt intake < 2300 mg/Bradley Navarro  Hyperlipidemia Lab Results  Component Value Date/Time   LDLDIRECT 89.0 05/02/2019 09:12 AM   . Pharmacist Clinical Goal(s): o Over the next 90 days, patient will work with PharmD and providers to maintain LDL goal < 100 . Current regimen:  o Atorvastatin 36m daily at bedtime . Interventions: o Discussed LDL goal  . Patient self care activities - Over the next 90 days, patient will: o Maintain cholesterol medication regimen.   Pre-Diabetes Lab Results  Component Value Date/Time   HGBA1C 6.1 05/02/2019 09:12 AM   HGBA1C 5.9 05/31/2018 09:40 AM   . Pharmacist Clinical  Goal(s): o Over the next 90 days, patient will work with PharmD and providers to maintain A1c goal <6.5% . Current regimen:  o Diet and exercise management   . Interventions: o Discussed a1c goal  . Patient self care activities - Over the next 90 days, patient will: o Maintain a1c less than 6.5%  Medication management . Pharmacist Clinical Goal(s): o Over the next 90 days, patient will work with PharmD and providers to maintain optimal medication adherence . [facility] . Interventions o Comprehensive medication review performed. o Continue current medication management strategy . Patient self care activities - Over the next 90 days, patient will: o Focus on medication adherence by filling and taking medications appropriately  o Take medications as prescribed o Report any questions or concerns to PharmD and/or provider(s)  Initial goal documentation       Social Hx:  Married. Cares for wife who has had multiple strokes.  Hypertension   BP goal is:  <140/90  Office blood pressures are  BP Readings from Last 3 Encounters:  12/31/19 (!) 148/80  05/02/19 136/61  04/18/19 (!) 144/73   Patient checks BP at home several times per month Patient home BP readings are ranging: Unable to assess "143/80 or something like"  Patient has failed these meds in the past: metoprolol (T wave inversions and RBBB) Patient is currently uncontrolled on the following medications:  . Losartan 1049mdaily  We discussed BP goal  Denies headaches, chest pains, and dizziness  Plan -Continue current medications  -Check BP 2-3 times a week and record   Hyperlipidemia   LDL goal < 100  Last lipids Lab Results  Component Value Date   CHOL 152 05/02/2019   HDL 34.70 (L) 05/02/2019   LDLDIRECT 89.0 05/02/2019   TRIG 230.0 (H) 05/02/2019   CHOLHDL 4 05/02/2019   Hepatic Function Latest Ref Rng & Units 05/02/2019 07/16/2018 05/31/2018  Total Protein 6.0 - 8.3 g/dL - - -   Albumin 3.5 - 5.2 g/dL - - -  AST 0 - 37 U/L 19 15 18   ALT 0 - 53 U/L 18 14 15   Alk Phosphatase 39 - 117 U/L - - -  Total Bilirubin 0.2 - 1.2 mg/dL - - -     The 10-year ASCVD risk score (GMikey BussingC Jr., et al., 2013) is: 42.3%   Values used to calculate the score:     Age: [age]     Sex: Male     Is Non-Hispanic African American: No     Diabetic: No     Tobacco smoker: No     Systolic Blood Pressure: 14924mHg     Is BP treated: Yes     HDL Cholesterol: 34.7 mg/dL     Total Cholesterol: 152 mg/dL   Patient has failed these meds in past: None noted  Patient is currently controlled on the following medications:  . Atorvastatin  80m daily at bedtime  We discussed:  LDL goal  Plan -Continue current medications  Pre-Diabetes   A1c goal <6.5%  Recent Relevant Labs: Lab Results  Component Value Date/Time   HGBA1C 6.1 05/02/2019 09:12 AM   HGBA1C 5.9 05/31/2018 09:40 AM   GFR 92.20 12/31/2019 08:11 AM   GFR 88.59 03/21/2019 01:51 PM    Patient has failed these meds in past: None noted  Patient is currently controlled on the following medications: . None noted   We discussed: A1c goal   "I do like my sweets now!"  Diet Tries to eat something different every Brandon Wiechman B -  Bacon and pimento cheese biscuit; oatmeal; honey bun; cereal (cheerios, frosted flakes) "I like my biscuits" eats them about 3 times per week L - Fish and chips with baked beans; steak; chicken; pork; soup D - Doesn't eat this meal Snacks - rarely  Drinks - half/half sweet tea, soda daily (sprite or cheerwine), water, no coffee or juice  Exercise Has stairs in house  Plan -Continue current medications   GERD   Patient has failed these meds in past: None noted  Patient is currently controlled on the following medications:  . Omeprazole 219mtwice daily  No symptoms while taking  "I'd be lost without that one" referring to omperazole  Plan -Continue current medications   Chronic Back Pain    Followed by Pain Management (Dr. SaMaia Petties Patient has failed these meds in past: None noted  Patient is currently controlled on the following medications: . Hydrocodone 10/32527mvery 6 hours as needed (taking twice daily) . Morphine 53m97mice daily . Tizanidine 2mg 76mmg e73my 8 hours as needed (taking twice daily)  Plan -Continue current medications  BPH   PSA  Date Value Ref Range Status  06/23/2017 1.84 0.10 - 4.00 ng/mL Final    Comment:    Test performed using Access Hybritech PSA Assay, a parmagnetic partical, chemiluminecent immunoassay.  05/24/2010 2.78 0.10 - 4.00 ng/mL Final     Patient has failed these meds in past: None noted  Patient is currently controlled on the following medications:  . Tamsulosin 0.4mg da36m  Overnight urination: 1; sometimes not at all; depends on how much liquid drank before bed time Bladder Emptying: yes Stream vs Dribbling: stream  Plan -Continue current medications   Vaccines   Reviewed and discussed patient's vaccination history.    Immunization History  Administered Date(s) Administered  . Fluad Quad(high Dose 65+) 05/02/2019, 03/26/2020  . Influenza Split 03/30/2011, 03/28/2012  . Influenza Whole 03/02/2010  . Influenza, High Dose Seasonal PF 03/17/2015, 04/19/2016, 03/16/2017, 03/26/2018  . Influenza,inj,Quad PF,6+ Mos 03/20/2013, 03/25/2014  . PFIZER SARS-COV-2 Vaccination 08/02/2019, 08/24/2019, 03/20/2020  . Pneumococcal Conjugate-13 04/17/2015  . Pneumococcal Polysaccharide-23 03/02/2010  . Td 04/17/2015   [facility] Shingrix? No, too expensive  Plan -Update [facility] record with booster vaccination   Medication Management   [facility]:  [id number] [address], [place] [phone] Continue current medication management strategy   Follow up: 3 month phone  visit  Braelynne Garinger Melvenia BeamarmD, BCACP Clinical Pharmacist [facility][phone]

## 2020-07-10 ENCOUNTER — Other Ambulatory Visit: Payer: Self-pay | Admitting: Internal Medicine

## 2020-07-21 ENCOUNTER — Encounter: Payer: Self-pay | Admitting: Internal Medicine

## 2020-07-21 ENCOUNTER — Other Ambulatory Visit: Payer: Self-pay

## 2020-07-21 ENCOUNTER — Ambulatory Visit (INDEPENDENT_AMBULATORY_CARE_PROVIDER_SITE_OTHER): Payer: PPO | Admitting: Internal Medicine

## 2020-07-21 VITALS — BP 116/64 | HR 63 | Temp 98.3°F | Resp 18 | Ht 70.0 in | Wt 215.4 lb

## 2020-07-21 DIAGNOSIS — I10 Essential (primary) hypertension: Secondary | ICD-10-CM | POA: Diagnosis not present

## 2020-07-21 DIAGNOSIS — Z125 Encounter for screening for malignant neoplasm of prostate: Secondary | ICD-10-CM

## 2020-07-21 DIAGNOSIS — R739 Hyperglycemia, unspecified: Secondary | ICD-10-CM | POA: Diagnosis not present

## 2020-07-21 DIAGNOSIS — Z23 Encounter for immunization: Secondary | ICD-10-CM | POA: Diagnosis not present

## 2020-07-21 DIAGNOSIS — E7800 Pure hypercholesterolemia, unspecified: Secondary | ICD-10-CM

## 2020-07-21 DIAGNOSIS — Z0000 Encounter for general adult medical examination without abnormal findings: Secondary | ICD-10-CM

## 2020-07-21 NOTE — Patient Instructions (Signed)
Check the  blood pressure 2 or 3 times once a month. BP GOAL is between 110/65 and  135/85. If it is consistently higher or lower, let me know    GO TO THE LAB : Get the blood work     [place], [place] back for a checkup in 6 months

## 2020-07-21 NOTE — Progress Notes (Signed)
Pre visit review using our clinic review tool, if applicable. No additional management support is needed unless otherwise documented below in the visit note. 

## 2020-07-21 NOTE — Progress Notes (Signed)
Subjective:    Patient ID: Bradley Navarro, male    DOB: 05-09-1941, 80 y.o.   MRN: [id number]  DOS:  07/21/2020 Type of visit - description: CPX No new concerns. Continue with back pain, good days and bad days, managed elsewhere. On Flomax, occasional difficulty but otherwise feels that the medication has helped significantly   Review of Systems  Other than above, a 14 point review of systems is negative      Past Medical History:  Diagnosis Date  . Chronic back pain   . Elevated BP without diagnosis of hypertension 04/24/2017  . History of headache    frontal lobe cluster headaches  . History of kidney stones   . Post laminectomy syndrome   . SCC (squamous cell carcinoma) 01/2020  . Sciatica of right side 2016   S/P L5-S1 revision lam/PSF on 04-10-12 w/ relief of RLE pain post op, MRI 01/2014 shows minimal foraminal stenosis at L4-5 w/o recurrent stenosis at L5-S1    Past Surgical History:  Procedure Laterality Date  . APPENDECTOMY    . LUMBAR DISC SURGERY  2012   L-5, Dr Vennie Homans  . LUMBAR FUSION  2015   rod, plates  . r arm surger      Allergies as of 07/21/2020      Reactions   Penicillins Swelling   redness diffusely 1962      Medication List       Accurate as of July 21, 2020 11:59 PM. If you have any questions, ask your nurse or doctor.        atorvastatin 10 MG tablet Commonly known as: LIPITOR Take 1 tablet (10 mg total) by mouth at bedtime.   HYDROcodone-acetaminophen 10-325 MG tablet Commonly known as: NORCO Take 1 tablet by mouth every 6 (six) hours as needed.   losartan 100 MG tablet Commonly known as: COZAAR Take 1 tablet (100 mg total) by mouth daily.   morphine 30 MG 12 hr tablet Commonly known as: MS CONTIN Take 30 mg by mouth in the morning and at bedtime.   multivitamin with minerals Tabs tablet Take 1 tablet by mouth daily.   omeprazole 20 MG capsule Commonly known as: PRILOSEC Take 1 capsule (20 mg total) by mouth 2 (two)  times daily.   tamsulosin 0.4 MG Caps capsule Commonly known as: FLOMAX Take 0.4 mg by mouth daily.   tizanidine 2 MG capsule Commonly known as: ZANAFLEX Take 2-4 mg by mouth every 8 (eight) hours as needed. Not to exceed 3 doses in 24 hours.          Objective:   Physical Exam BP 116/64 (BP Location: Left Arm, Patient Position: Sitting, Cuff Size: Normal)   Pulse 63   Temp 98.3 F (36.8 C) (Oral)   Resp 18   Ht 5\' 10"  (1.778 m)   Wt 215 lb 6 oz (97.7 kg)   SpO2 97%   BMI 30.90 kg/m  General: Well developed, NAD, BMI noted Neck: No  thyromegaly  HEENT:  Normocephalic . Face symmetric, atraumatic Lungs:  CTA B Normal respiratory effort, no intercostal retractions, no accessory muscle use. Heart: RRR,  no murmur.  Abdomen:  Not distended, soft, non-tender. No rebound or rigidity.   Lower extremities: no pretibial edema bilaterally  Skin: Exposed areas without rash. Not pale. Not jaundice Neurologic:  alert & oriented X3.  Speech normal, gait and transfer limited by back pain Strength symmetric and appropriate for age.  Psych: Cognition and judgment appear intact.  Cooperative with normal attention span and concentration.  Behavior appropriate. No anxious or depressed appearing.     Assessment    Assessment Hyperglycemia (A1c 5.28 May 2018) HTN High cholesterol h/o HA-- s/p extensive w/u in the past. Has seen Dr Maureen Chatters and neuro @ [facility]   h/o kidney stones , multiple Chronic back pain-- sees pain management, see surgeries Chronic hoarseness: Never seen by ENT Sees dermatology SCC Abnormal EKG: TWI, RBBB, see note from 05-2018 BPH, LUTS: Saw urology, last visit 2020.   PLAN Here for CPX Hyperglycemia: Check A1c HTN: BP today is very good, recommend ambulatory BPs, continue losartan.  Labs. High cholesterol: On atorvastatin, check a lipid panel, nonfasting. BPH, LUTS: Last visit with urology more than a year ago, symptoms controlled w/  Flomax,  RF as needed RTC 6 months  This visit occurred during the SARS-CoV-2 public health emergency.  Safety protocols were in place, including screening questions prior to the visit, additional usage of staff PPE, and extensive cleaning of exam room while observing appropriate contact time as indicated for disinfecting solutions.

## 2020-07-22 ENCOUNTER — Encounter: Payer: Self-pay | Admitting: Internal Medicine

## 2020-07-22 LAB — LIPID PANEL
Cholesterol: 131 mg/dL (ref 0–200)
HDL: 31.8 mg/dL — ABNORMAL LOW (ref >39.00)
NonHDL: 98.89
Total CHOL/HDL Ratio: 4
Triglycerides: 224 mg/dL — ABNORMAL HIGH (ref 0.0–149.0)
VLDL: 44.8 mg/dL — ABNORMAL HIGH (ref 0.0–40.0)

## 2020-07-22 LAB — LDL CHOLESTEROL, DIRECT: Direct LDL: 74 mg/dL

## 2020-07-22 LAB — COMPREHENSIVE METABOLIC PANEL
ALT: 12 U/L (ref 0–53)
AST: 13 U/L (ref 0–37)
Albumin: 4.3 g/dL (ref 3.5–5.2)
Alkaline Phosphatase: 79 U/L (ref 39–117)
BUN: 18 mg/dL (ref 6–23)
CO2: 31 mEq/L (ref 19–32)
Calcium: 9.4 mg/dL (ref 8.4–10.5)
Chloride: 103 mEq/L (ref 96–112)
Creatinine, Ser: 0.94 mg/dL (ref 0.40–1.50)
GFR: 77.7 mL/min (ref >60.00)
Glucose, Bld: 141 mg/dL — ABNORMAL HIGH (ref 70–99)
Potassium: 4.1 mEq/L (ref 3.5–5.1)
Sodium: 141 mEq/L (ref 135–145)
Total Bilirubin: 0.8 mg/dL (ref 0.2–1.2)
Total Protein: 6.7 g/dL (ref 6.0–8.3)

## 2020-07-22 LAB — HEMOGLOBIN A1C: Hgb A1c MFr Bld: 6.6 % — ABNORMAL HIGH (ref 4.6–6.5)

## 2020-07-22 LAB — PSA: PSA: 2.6 ng/mL (ref 0.10–4.00)

## 2020-07-22 NOTE — Assessment & Plan Note (Signed)
Here for CPX Hyperglycemia: Check A1c HTN: BP today is very good, recommend ambulatory BPs, continue losartan.  Labs. High cholesterol: On atorvastatin, check a lipid panel, nonfasting. BPH, LUTS: Last visit with urology more than a year ago, symptoms controlled w/  Flomax, RF as needed RTC 6 months

## 2020-07-22 NOTE — Assessment & Plan Note (Signed)
-  Td 2016 - pnm 23: 2011, booster 07-2020.  Prevnar 2016 - shingrex:  cost is an issue  -  had covid vax x 3 - s/p flu shot  -CCS:  Never had a colonoscopy, again d/w pt: not interested  -Prostate cancer screening:  check a PSA. - labs: CMP, FLP, A1c, PSA  -Diet: Discussed -Exercise :very limited   due to MSK issues

## 2020-07-25 ENCOUNTER — Other Ambulatory Visit: Payer: Self-pay | Admitting: Internal Medicine

## 2020-08-05 ENCOUNTER — Other Ambulatory Visit: Payer: Self-pay | Admitting: Internal Medicine

## 2020-08-12 ENCOUNTER — Encounter: Payer: Self-pay | Admitting: Internal Medicine

## 2020-08-12 ENCOUNTER — Ambulatory Visit (HOSPITAL_BASED_OUTPATIENT_CLINIC_OR_DEPARTMENT_OTHER)
Admission: RE | Admit: 2020-08-12 | Discharge: 2020-08-12 | Disposition: A | Discharge status: Home / Self Care | Payer: PPO | Source: Ambulatory Visit | Attending: Internal Medicine | Admitting: Internal Medicine

## 2020-08-12 ENCOUNTER — Ambulatory Visit (INDEPENDENT_AMBULATORY_CARE_PROVIDER_SITE_OTHER): Payer: PPO | Admitting: Internal Medicine

## 2020-08-12 ENCOUNTER — Other Ambulatory Visit: Payer: Self-pay

## 2020-08-12 VITALS — BP 122/70 | HR 87 | Temp 98.1°F | Resp 18 | Ht 70.0 in | Wt 216.2 lb

## 2020-08-12 DIAGNOSIS — M1811 Unilateral primary osteoarthritis of first carpometacarpal joint, right hand: Secondary | ICD-10-CM

## 2020-08-12 DIAGNOSIS — M79641 Pain in right hand: Secondary | ICD-10-CM

## 2020-08-12 NOTE — Progress Notes (Signed)
Subjective:    Patient ID: Bradley Navarro, male    DOB: June 02, 1941, 80 y.o.   MRN: [id number]  DOS:  08/12/2020 Type of visit - description: Acute Symptoms a started a week ago: Pain at the base of the right thumb. Worse in the morning, better as he warms up and put some icy hot .  Denies any swelling, redness or injury. He does use his hands a lot because he helps his wife who is disabled   Review of Systems See above   Past Medical History:  Diagnosis Date   Chronic back pain    Elevated BP without diagnosis of hypertension 04/24/2017   History of headache    frontal lobe cluster headaches   History of kidney stones    Post laminectomy syndrome    SCC (squamous cell carcinoma) 01/2020   Sciatica of right side 2016   S/P L5-S1 revision lam/PSF on 04-10-12 w/ relief of RLE pain post op, MRI 01/2014 shows minimal foraminal stenosis at L4-5 w/o recurrent stenosis at L5-S1    Past Surgical History:  Procedure Laterality Date   [place] SURGERY  2012   L-5, Dr Vennie Homans   LUMBAR FUSION  2015   rod, plates   r arm surger      Allergies as of 08/12/2020      Reactions   Penicillins Swelling   redness diffusely 1962      Medication List       Accurate as of August 12, 2020 11:59 PM. If you have any questions, ask your nurse or doctor.        atorvastatin 10 MG tablet Commonly known as: LIPITOR Take 1 tablet (10 mg total) by mouth at bedtime.   HYDROcodone-acetaminophen 10-325 MG tablet Commonly known as: NORCO Take 1 tablet by mouth every 6 (six) hours as needed.   losartan 100 MG tablet Commonly known as: COZAAR Take 1 tablet (100 mg total) by mouth daily.   morphine 30 MG 12 hr tablet Commonly known as: MS CONTIN Take 30 mg by mouth in the morning and at bedtime.   multivitamin with minerals Tabs tablet Take 1 tablet by mouth daily.   omeprazole 20 MG capsule Commonly known as: PRILOSEC Take 1 capsule (20 mg total)  by mouth 2 (two) times daily.   tamsulosin 0.4 MG Caps capsule Commonly known as: FLOMAX Take 0.4 mg by mouth daily.   tizanidine 2 MG capsule Commonly known as: ZANAFLEX Take 2-4 mg by mouth every 8 (eight) hours as needed. Not to exceed 3 doses in 24 hours.          Objective:   Physical Exam BP 122/70 (BP Location: Left Arm, Patient Position: Sitting, Cuff Size: Normal)    Pulse 87    Temp 98.1 F (36.7 C) (Oral)    Resp 18    Ht 5\' 10"  (1.778 m)    Wt 216 lb 4 oz (98.1 kg)    SpO2 98%    BMI 31.03 kg/m  General:   Well developed, NAD, BMI noted. HEENT:  Normocephalic . Face symmetric, atraumatic MSK: Both hands w/ bony changes consistent with DJD. Right hand: No synovitis, + TTP at the base of the thumb by the Carpometacarpal joint.  No effusion, redness or warmness. Right wrist: No effusion, no redness Skin: Not pale. Not jaundice Neurologic:  alert & oriented X3.  Speech normal, gait appropriate for age and unassisted Psych--  Cognition and judgment  appear intact.  Cooperative with normal attention span and concentration.  Behavior appropriate. No anxious or depressed appearing.      Assessment     Assessment Hyperglycemia (A1c 5.28 May 2018) HTN High cholesterol h/o HA-- s/p extensive w/u in the past. Has seen Dr Maureen Chatters and neuro @ [facility]   h/o kidney stones , multiple Chronic back pain-- sees pain management, see surgeries Chronic hoarseness: Never seen by ENT Sees dermatology SCC Abnormal EKG: TWI, RBBB, see note from 05-2018 BPH, LUTS: Saw urology, last visit 2020.   PLAN DJD, right hand: As described above, to be sure we will get a x-ray. Otherwise recommend Voltaren gel, Tylenol, ice and call if not gradually better. Consider a round of steroids if needed.  (Has DM, no glucometer)  This visit occurred during the SARS-CoV-2 public health emergency.  Safety protocols were in place, including screening questions prior to the visit, additional  usage of staff PPE, and extensive cleaning of exam room while observing appropriate contact time as indicated for disinfecting solutions.

## 2020-08-12 NOTE — Progress Notes (Unsigned)
Pre visit review using our clinic review tool, if applicable. No additional management support is needed unless otherwise documented below in the visit note. 

## 2020-08-12 NOTE — Patient Instructions (Addendum)
Get your x-ray downstairs  Apply Voltaren gel 3 times a day  Tylenol  500 mg OTC 2 tabs a day every 8 hours as needed for pain  Ice twice daily as needed  Call if not gradually better Advance Directive  Advance directives are legal documents that allow you to make decisions about your health care and medical treatment in case you become unable to communicate for yourself. Advance directives let your wishes be known to family, friends, and health care providers. Discussing and writing advance directives should happen over time rather than all at once. Advance directives can be changed and updated at any time. There are different types of advance directives, such as:  Medical power of attorney.  Living will.  Do not resuscitate (DNR) order or do not attempt resuscitation (DNAR) order. Health care proxy and medical power of attorney A health care proxy is also called a health care agent. This person is appointed to make medical decisions for you when you are unable to make decisions for yourself. Generally, people ask a trusted friend or family member to act as their proxy and represent their preferences. Make sure you have an agreement with your trusted person to act as your proxy. A proxy may have to make a medical decision on your behalf if your wishes are not known. A medical power of attorney, also called a durable power of attorney for health care, is a legal document that names your health care proxy. Depending on the laws in your state, the document may need to be:  Signed.  Notarized.  Dated.  Copied.  Witnessed.  Incorporated into your medical record. You may also want to appoint a trusted person to manage your money in the event you are unable to do so. This is called a durable power of attorney for finances. It is a separate legal document from the durable power of attorney for health care. You may choose your health care proxy or someone different to act as your agent in  money matters. If you do not appoint a proxy, or there is a concern that the proxy is not acting in your best interest, a court may appoint a guardian to act on your behalf. Living will A living will is a set of instructions that state your wishes about medical care when you cannot express them yourself. Health care providers should keep a copy of your living will in your medical record. You may want to give a copy to family members or friends. To alert caregivers in case of an emergency, you can place a card in your wallet to let them know that you have a living will and where they can find it. A living will is used if you become:  Terminally ill.  Disabled.  Unable to communicate or make decisions. The following decisions should be included in your living will:  To use or not to use life support equipment, such as dialysis machines and breathing machines (ventilators).  Whether you want a DNR or DNAR order. This tells health care providers not to use cardiopulmonary resuscitation (CPR) if breathing or heartbeat stops.  To use or not to use tube feeding.  To be given or not to be given food and fluids.  Whether you want comfort (palliative) care when the goal becomes comfort rather than a cure.  Whether you want to donate your organs and tissues. A living will does not give instructions for distributing your money and property if you should pass  away. DNR or DNAR A DNR or DNAR order is a request not to have CPR in the event that your heart stops beating or you stop breathing. If a DNR or DNAR order has not been made and shared, a health care provider will try to help any patient whose heart has stopped or who has stopped breathing. If you plan to have surgery, talk with your health care provider about how your DNR or DNAR order will be followed if problems occur. What if I do not have an advance directive? Some states assign family decision makers to act on your behalf if you do not have  an advance directive. Each state has its own laws about advance directives. You may want to check with your health care provider, attorney, or state representative about the laws in your state. Summary  Advance directives are legal documents that allow you to make decisions about your health care and medical treatment in case you become unable to communicate for yourself.  The process of discussing and writing advance directives should happen over time. You can change and update advance directives at any time.  Advance directives may include a medical power of attorney, a living will, and a DNR or DNAR order. This information is not intended to replace advice given to you by your health care provider. Make sure you discuss any questions you have with your health care provider. Document Revised: 03/10/2020 Document Reviewed: 03/10/2020 Elsevier Patient Education  2021 [employer].

## 2020-08-13 ENCOUNTER — Telehealth: Payer: Self-pay | Admitting: Pharmacist

## 2020-08-13 NOTE — Progress Notes (Addendum)
Chronic Care Management Pharmacy Assistant   Name: Bradley Navarro  MRN: [id number] DOB: 1941/03/23  Reason for Encounter: Disease State For HTN.  Patient Questions:  1.  Have you seen any other providers since your last visit? Yes.   2.  Any changes in your medicines or health? Yes.   PCP : Colon Branch, MD   Their chronic conditions include: Hypertension, Hyperlipidemia, Pre-Diabetes, GERD, Chronic Back Pain, BPH  Office Visits:  08/12/20 Dr. Larose Kells  For Osteoarthritis DJD, right hand. Xray taken. Per note: Apply Voltaren gel 3 times a day  Tylenol  500 mg OTC 2 tabs a day every 8 hours as needed for pain and ice the right hand twice daily as needed.  07/21/20 Dr. Larose Kells. Annual physical exam. labs drawn. Per note: Doctor would like for the patient to check the blood pressure 2-3 times once a month, goal is between 110/65 and 135/ 85. No medication changes.  Consults: None since 06/29/20  Allergies:   Allergies  Allergen Reactions   Penicillins Swelling    redness diffusely 1962    Medications: Outpatient Encounter Medications as of 08/13/2020  Medication Sig Note   atorvastatin (LIPITOR) 10 MG tablet Take 1 tablet (10 mg total) by mouth at bedtime.    HYDROcodone-acetaminophen (NORCO) 10-325 MG per tablet Take 1 tablet by mouth every 6 (six) hours as needed.    losartan (COZAAR) 100 MG tablet Take 1 tablet (100 mg total) by mouth daily.    morphine (MS CONTIN) 30 MG 12 hr tablet Take 30 mg by mouth in the morning and at bedtime.     Multiple Vitamin (MULITIVITAMIN WITH MINERALS) TABS Take 1 tablet by mouth daily.    omeprazole (PRILOSEC) 20 MG capsule Take 1 capsule (20 mg total) by mouth 2 (two) times daily. 06/29/2020: Taking once daily   tamsulosin (FLOMAX) 0.4 MG CAPS capsule Take 0.4 mg by mouth daily.    tizanidine (ZANAFLEX) 2 MG capsule Take 2-4 mg by mouth every 8 (eight) hours as needed. Not to exceed 3 doses in 24 hours.    No facility-administered encounter  medications on file as of 08/13/2020.    Current Diagnosis: Patient Active Problem List   Diagnosis Date Noted   SCC (squamous cell carcinoma) 02/28/2020   High cholesterol 10/30/2018   Hyperglycemia 10/30/2018   Hypertension 04/24/2017   Annual physical exam 04/17/2015   PCP NOTES >>>> 04/17/2015   Chronic back pain 08/17/2010   HEADACHE 02/09/2010    Goals Addressed   None    Reviewed chart prior to disease state call. Spoke with patient regarding BP  Recent Office Vitals: BP Readings from Last 3 Encounters:  08/12/20 122/70  07/21/20 116/64  12/31/19 (!) 148/80   Pulse Readings from Last 3 Encounters:  08/12/20 87  07/21/20 63  12/31/19 76    Wt Readings from Last 3 Encounters:  08/12/20 216 lb 4 oz (98.1 kg)  07/21/20 215 lb 6 oz (97.7 kg)  12/31/19 220 lb 2 oz (99.8 kg)     Kidney Function Lab Results  Component Value Date/Time   CREATININE 0.94 07/21/2020 01:29 PM   CREATININE 0.81 12/31/2019 08:11 AM   GFR 77.70 07/21/2020 01:29 PM   GFRNONAA 74 (L) 02/09/2014 06:55 PM   GFRAA 85 (L) 02/09/2014 06:55 PM    BMP Latest Ref Rng & Units 07/21/2020 12/31/2019 03/21/2019  Glucose 70 - 99 mg/dL 141(H) 159(H) 86  BUN 6 - 23 mg/dL 18 14 14  Creatinine 0.40 - 1.50 mg/dL 0.94 0.81 0.84  Sodium 135 - 145 mEq/L 141 139 139  Potassium 3.5 - 5.1 mEq/L 4.1 4.2 4.2  Chloride 96 - 112 mEq/L 103 103 99  CO2 19 - 32 mEq/L 31 28 28   Calcium 8.4 - 10.5 mg/dL 9.4 9.3 9.5    Current antihypertensive regimen:  Losartan 100 mg daily  How often are you checking your Blood Pressure? several times per month   Current home BP readings: Patient stated he doesn't write them down but they have been within the normal range.  What recent interventions/DTPs have been made by any provider to improve Blood Pressure control since last CPP Visit: None.  Any recent hospitalizations or ED visits since last visit with CPP? Patient stated no.  What diet changes have been made to improve  Blood Pressure Control?  Patient stated he isn't on any particular diet but he does have vegetables daily. Patient stated he drinks a variety of things like water  What exercise is being done to improve your Blood Pressure Control?  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  He stated soon he may have to get some help for his wife.  Adherence Review: Is the patient currently on ACE/ARB medication? Yes, Losartan 30 mg  Does the patient have >5 day gap between last estimated fill dates? No  Follow-Up:  Pharmacist Review   Charlann Lange, RMA Clinical Pharmacist Assistant [phone]  10 minutes spent in review, coordination, and documentation.  Reviewed by: Beverly Milch, PharmD Clinical Pharmacist [facility] [phone]

## 2020-08-13 NOTE — Assessment & Plan Note (Signed)
DJD, right hand: As described above, to be sure we will get a x-ray. Otherwise recommend Voltaren gel, Tylenol, ice and call if not gradually better. Consider a round of steroids if needed.  (Has DM, no glucometer)

## 2020-08-14 ENCOUNTER — Ambulatory Visit: Payer: PPO | Admitting: Internal Medicine

## 2020-08-21 DIAGNOSIS — M5431 Sciatica, right side: Secondary | ICD-10-CM | POA: Diagnosis not present

## 2020-08-21 DIAGNOSIS — M4807 Spinal stenosis, lumbosacral region: Secondary | ICD-10-CM | POA: Diagnosis not present

## 2020-09-28 ENCOUNTER — Ambulatory Visit (INDEPENDENT_AMBULATORY_CARE_PROVIDER_SITE_OTHER): Payer: PPO | Admitting: Pharmacist

## 2020-09-28 DIAGNOSIS — E7800 Pure hypercholesterolemia, unspecified: Secondary | ICD-10-CM

## 2020-09-28 DIAGNOSIS — I10 Essential (primary) hypertension: Secondary | ICD-10-CM | POA: Diagnosis not present

## 2020-09-28 DIAGNOSIS — G8929 Other chronic pain: Secondary | ICD-10-CM

## 2020-09-28 DIAGNOSIS — M545 Low back pain, unspecified: Secondary | ICD-10-CM

## 2020-09-28 DIAGNOSIS — R739 Hyperglycemia, unspecified: Secondary | ICD-10-CM

## 2020-09-28 NOTE — Chronic Care Management (AMB) (Signed)
Chronic Care Management Pharmacy Note  09/28/2020 Name:  Bradley Navarro MRN:  [id number] DOB:  Apr 02, 1941  Subjective: Bradley Navarro is an 80 y.o. year old male who is a primary patient of Paz, Alda Berthold, MD.  The CCM team was consulted for assistance with disease management and care coordination needs.    Engaged with patient by telephone for follow up visit in response to provider referral for pharmacy case management and/or care coordination services.   Consent to Services:  The patient was given information about Chronic Care Management services, agreed to services, and gave verbal consent prior to initiation of services.  Please see initial visit note for detailed documentation.   Patient Care Team: Colon Branch, MD as PCP - General Czinsky, Stephani Police, [place] as Physician Assistant (Orthopedic Surgery) Zonia Kief, MD as Consulting Physician (Rehabilitation) Lucas Mallow, MD as Consulting Physician (Urology) [place], Jennefer Bravo, MD as Referring Physician (Dermatology) Cherre Robins, PharmD (Pharmacist)  Recent office visits: 08/12/20  PCP (Dr. Larose Kells) Seen for Osteoarthritis of rght hand. Xray taken. Recommended apply Voltaren gel 3 times a day and Tylenol  500 mg OTC 2 tabs a day every 8 hours as needed for pain and ice the right hand twice daily as needed.   07/21/20 Dr. Larose Kells. Annual physical exam. labs drawn. Per note: Doctor would like for the patient to check the blood pressure 2-3 times once a month, goal is between 110/65 and 135/ 85. No medication changes.   Recent consult visits:  06/26/2020: Pain Management (Dr Maia Petties) unable to see full visit or determine if medication changes were made. Patient states no recent changes in pain medication 04/24/20: Pain Management (Dr Maia Petties) unable to see full visit or determine if medication changes were made Hospital visits: None in previous 6 months  Objective:  Lab Results  Component Value Date   CREATININE 0.94  07/21/2020   CREATININE 0.81 12/31/2019   CREATININE 0.84 03/21/2019    Lab Results  Component Value Date   HGBA1C 6.6 (H) 07/21/2020       Component Value Date/Time   CHOL 131 07/21/2020 1329   TRIG 224.0 (H) 07/21/2020 1329   HDL 31.80 (L) 07/21/2020 1329   CHOLHDL 4 07/21/2020 1329   VLDL 44.8 (H) 07/21/2020 1329   LDLDIRECT 74.0 07/21/2020 1329    Hepatic Function Latest Ref Rng & Units 07/21/2020 05/02/2019 07/16/2018  Total Protein 6.0 - 8.3 g/dL 6.7 - -  Albumin 3.5 - 5.2 g/dL 4.3 - -  AST 0 - 37 U/L 13 19 15   ALT 0 - 53 U/L 12 18 14   Alk Phosphatase 39 - 117 U/L 79 - -  Total Bilirubin 0.2 - 1.2 mg/dL 0.8 - -    Lab Results  Component Value Date/Time   TSH 3.17 05/02/2019 09:12 AM   TSH 3.07 05/01/2017 08:15 AM    CBC Latest Ref Rng & Units 12/31/2019 03/21/2019 05/31/2018  WBC 4.0 - 10.5 K/uL 5.7 14.4(H) 5.6  Hemoglobin 13.0 - 17.0 g/dL 13.9 13.3 14.8  Hematocrit 39.0 - 52.0 % 39.8 39.7 42.5  Platelets 150.0 - 400.0 K/uL 201.0 263.0 212.0    No results found for: VD25OH  Clinical ASCVD: No  The 10-year ASCVD risk score Mikey Bussing DC Jr., et al., 2013) is: 31.9%   Values used to calculate the score:     Age: 39 years     Sex: Male     Is Non-Hispanic African American: No  Diabetic: No     Tobacco smoker: No     Systolic Blood Pressure: 798 mmHg     Is BP treated: Yes     HDL Cholesterol: 31.8 mg/dL     Total Cholesterol: 131 mg/dL     Social History   Tobacco Use  Smoking Status Former Smoker  Smokeless Tobacco Never Used  Tobacco Comment   quit in the 90, light smoker    BP Readings from Last 3 Encounters:  08/12/20 122/70  07/21/20 116/64  12/31/19 (!) 148/80   Pulse Readings from Last 3 Encounters:  08/12/20 87  07/21/20 63  12/31/19 76   Wt Readings from Last 3 Encounters:  08/12/20 216 lb 4 oz (98.1 kg)  07/21/20 215 lb 6 oz (97.7 kg)  12/31/19 220 lb 2 oz (99.8 kg)    Assessment: Review of patient past medical history,  allergies, medications, health status, including review of consultants reports, laboratory and other test data, was performed as part of comprehensive evaluation and provision of chronic care management services.   SDOH:  (Social Determinants of Health) assessments and interventions performed:  SDOH Interventions   Flowsheet Row Most Recent Value  SDOH Interventions   Physical Activity Interventions Patient Refused  [patient states he gets enough physical activity caring for his wife and doing housework]      CCM Care Plan  Allergies  Allergen Reactions  . Penicillins Swelling    redness diffusely 1962    Medications Reviewed Today    Reviewed by Cherre Robins, PharmD (Pharmacist) on 09/28/20 at 105  Med List Status: <None>  Medication Order Taking? Sig Documenting Provider Last Dose Status Informant  atorvastatin (LIPITOR) 10 MG tablet [id number] Yes Take 1 tablet (10 mg total) by mouth at bedtime. Colon Branch, MD Taking Active   HYDROcodone-acetaminophen [facility]) 10-325 MG per tablet [id number] Yes Take 1 tablet by mouth every 6 (six) hours as needed. Zonia Kief, MD Taking Active   losartan (COZAAR) 100 MG tablet [id number] Yes Take 1 tablet (100 mg total) by mouth daily. Colon Branch, MD Taking Active   morphine (MS CONTIN) 30 MG 12 hr tablet [id number] Yes Take 30 mg by mouth in the morning and at bedtime.  [provider] Taking Active            Med Note [name], [place] Mar 16, 2017 10:26 AM)    Multiple Vitamin (MULITIVITAMIN WITH MINERALS) TABS [id number] Yes Take 1 tablet by mouth daily. [provider] Taking Active Multiple Informants  omeprazole (PRILOSEC) 20 MG capsule [id number] Yes Take 1 capsule (20 mg total) by mouth 2 (two) times daily. Tanna Furry, MD Taking Active            Med Note Quinn Axe Jun 29, 2020  2:08 PM) Taking once daily  tamsulosin [facility]) 0.4 MG CAPS capsule [id number] Yes Take 0.4 mg by mouth daily. [provider] Taking Active   tizanidine (ZANAFLEX) 2 MG capsule [id number] Yes Take 2-4 mg by mouth every 8 (eight) hours as needed. Not to exceed 3 doses in 24 hours. [provider] Taking Active           Patient Active Problem List   Diagnosis Date Noted  . SCC (squamous cell carcinoma) 02/28/2020  . High cholesterol 10/30/2018  . Hyperglycemia 10/30/2018  . Hypertension 04/24/2017  . Annual physical exam 04/17/2015  . PCP NOTES >>>> 04/17/2015  . Chronic back pain 08/17/2010  .  HEADACHE 02/09/2010    Immunization History  Administered Date(s) Administered  . Fluad Quad(high Dose 65+) 05/02/2019, 03/26/2020  . Influenza Split 03/30/2011, 03/28/2012  . Influenza Whole 03/02/2010  . Influenza, High Dose Seasonal PF 03/17/2015, 04/19/2016, 03/16/2017, 03/26/2018  . Influenza,inj,Quad PF,6+ Mos 03/20/2013, 03/25/2014  . PFIZER(Purple Top)SARS-COV-2 Vaccination 08/02/2019, 08/24/2019, 03/20/2020  . Pneumococcal Conjugate-13 04/17/2015  . Pneumococcal Polysaccharide-23 03/02/2010, 07/21/2020  . Td 04/17/2015    Conditions to be addressed/monitored: HTN and HLD  Care Plan : General Pharmacy (Adult)  Updates made by Cherre Robins, PHARMD since 09/28/2020 12:00 AM    Problem: . Chronic Disease Management support, education, and care coordination needs related to Hypertension, Hyperlipidemia, Pre-Diabetes, GERD, Chronic Back Pain, BPH   Priority: Medium  Onset Date: 09/28/2020  Note:   Current Barriers:  . Unable to maintain control of HTN  Pharmacist Clinical Goal(s):  Marland Kitchen Over the next 180 days, patient will achieve control of HTN as evidenced by BP <140/90  through collaboration with PharmD and provider.   Interventions: . 1:1 collaboration with Colon Branch, MD regarding development and update of comprehensive plan of care as evidenced by provider attestation and co-signature . Inter-disciplinary care team collaboration (see longitudinal plan of  care) . Comprehensive medication review performed; medication list updated in electronic medical record  Pre- Diabetes / Diabetes: . A1c increasing; current treatment: no pharmacologic therapy; Diet only . Current glucose readings: does not check at home.  . Denies hypoglycemic/hyperglycemic symptoms . Current meal patterns: Not following specific low CHO diet;  . Current exercise: none (patient declined intervention due to he feels he is active enough caring for wife and doing housework) . Recommended limit serving sizes and high CHO foods.  If A1c >6.5% at next check, consider adding metformin ER 530m daily   Hypertension: . Uncontrolled per last OV BP but per patient his BP is <140/90 at home.  .       Current treatment: losartan 1019mdaily ;  . Current home readings: patient states are good but does not record . Recommended continue current therapy; record BP readings for review at future appts. Continue to limit intake of sodium to <2300 mg daily   Hyperlipidemia: . Controlled with current treatment: Atorvastatin 1065mt bedtime;  . Recommended continue current therapy  GERD:  . Patient is currently controlled on the following medications:   Omeprazole 56m23mice daily Continue current medications  Chronic Back Pain:  . Current Regimen:  o Morphine ER 30mg37mce a day o Hydrocodone/ APAP 10/325mg 40mo every 6 hours if needed (usually takes 1 or 2 times per day) o Tizanidine 2mg - 13me 2 to 4 mg up to every 8 hours as needed (usually takes 2 times per day) . Recommended continue current therapy and follow up with pain clinic    BPH:  .  Patient is currently controlled on the following medications:  o Tamsulosin 0.4mg dai40mOvernight urination: once or less Recommend continue to follow up with urology           Medication Assistance: None required.  Patient affirms current coverage meets needs.  Patient's preferred pharmacy is:  [id number] [id number]  [place] [id number] [phone] Up:  Patient agrees to Care Plan and Follow-up.  Plan: Telephone follow up appointment with care management team member scheduled for:  6 months  Nailah Luepke EckCherre RobinsClinical Pharmacist [place]  [place]

## 2020-09-28 NOTE — Patient Instructions (Signed)
Visit Information  PATIENT GOALS: Goals Addressed            This Visit's Progress   . Chronic Care Management Pharmacy Care Plan       CARE PLAN ENTRY (see longitudinal plan of care for additional care plan information)  Current Barriers:  . Chronic Disease Management support, education, and care coordination needs related to Hypertension, Hyperlipidemia, Pre-Diabetes, GERD, Chronic Back Pain, BPH   Hypertension BP Readings from Last 3 Encounters:  12/31/19 (!) 148/80  05/02/19 136/61  04/18/19 (!) 144/73   . Pharmacist Clinical Goal(s): o Over the next 90 days, patient will work with PharmD and providers to achieve BP goal <140/90 . Current regimen:  o Losartan 100mg  daily . Interventions: o Discussed BP goal  . Patient self care activities - Over the next 90 days, patient will: o Check BP 2-3 times per week, document, and provide at future appointments o Ensure daily salt intake < 2300 mg/day  Hyperlipidemia Lab Results  Component Value Date/Time   LDLDIRECT 89.0 05/02/2019 09:12 AM   . Pharmacist Clinical Goal(s): o Over the next 90 days, patient will work with PharmD and providers to maintain LDL goal < 100 . Current regimen:  o Atorvastatin 10mg  daily at bedtime . Interventions: o Discussed LDL goal  . Patient self care activities - Over the next 90 days, patient will: o Maintain cholesterol medication regimen.   Pre-Diabetes Lab Results  Component Value Date/Time   HGBA1C 6.1 05/02/2019 09:12 AM   HGBA1C 5.9 05/31/2018 09:40 AM   . Pharmacist Clinical Goal(s): o Over the next 90 days, patient will work with PharmD and providers to maintain A1c goal <6.5% . Current regimen:  o Diet and exercise management   . Interventions: o Discussed A1c goal o Discussed diet and exercise . Patient self care activities - Over the next 90 days, patient will: o Maintain A1c less than 6.5%  Medication management . Pharmacist Clinical Goal(s): o Over the next 90  days, patient will work with PharmD and providers to maintain optimal medication adherence . [facility] . Interventions o Comprehensive medication review performed. o Continue current medication management strategy . Patient self care activities - Over the next 90 days, patient will: o Focus on medication adherence by filling and taking medications appropriately  o Take medications as prescribed o Report any questions or concerns to PharmD and/or provider(s)  Initial goal documentation        The patient verbalized understanding of instructions, educational materials, and care plan provided today and declined offer to receive copy of patient instructions, educational materials, and care plan.   Telephone follow up appointment with care management team member scheduled for:  6 months  Cherre Robins, PharmD Clinical Pharmacist [place] [facility] [place] [phone]

## 2020-10-16 DIAGNOSIS — M4807 Spinal stenosis, lumbosacral region: Secondary | ICD-10-CM | POA: Diagnosis not present

## 2020-10-16 DIAGNOSIS — M5431 Sciatica, right side: Secondary | ICD-10-CM | POA: Diagnosis not present

## 2020-10-20 DIAGNOSIS — L600 Ingrowing nail: Secondary | ICD-10-CM | POA: Diagnosis not present

## 2020-10-20 DIAGNOSIS — M79675 Pain in left toe(s): Secondary | ICD-10-CM | POA: Diagnosis not present

## 2020-11-23 ENCOUNTER — Ambulatory Visit (INDEPENDENT_AMBULATORY_CARE_PROVIDER_SITE_OTHER): Payer: PPO

## 2020-11-23 VITALS — Ht 70.0 in | Wt 216.0 lb

## 2020-11-23 DIAGNOSIS — Z0000 Encounter for general adult medical examination without abnormal findings: Secondary | ICD-10-CM

## 2020-11-23 NOTE — Patient Instructions (Signed)
Bradley Navarro , Thank you for taking time to complete your Medicare Wellness Visit. I appreciate your ongoing commitment to your health goals. Please review the following plan we discussed and let me know if I can assist you in the future.   Screening recommendations/referrals: Colonoscopy: No longer required Recommended yearly ophthalmology/optometry visit for glaucoma screening and checkup Recommended yearly dental visit for hygiene and checkup  Vaccinations: Influenza vaccine: Up to date Pneumococcal vaccine: Completed vaccines Tdap vaccine: Up to date-Due 04/16/2025 Shingles vaccine: Discuss with pharmacy   Covid-19: Up to date  Advanced directives: Please bring a copy fro your chart  Conditions/risks identified: See problem list  Next appointment: Follow up in one year for your annual wellness visit. 11/25/2021 @ 9:40  Preventive Care 65 Years and Older, Male Preventive care refers to lifestyle choices and visits with your health care provider that can promote health and wellness. What does preventive care include?  A yearly physical exam. This is also called an annual well check.  Dental exams once or twice a year.  Routine eye exams. Ask your health care provider how often you should have your eyes checked.  Personal lifestyle choices, including:  Daily care of your teeth and gums.  Regular physical activity.  Eating a healthy diet.  Avoiding tobacco and drug use.  Limiting alcohol use.  Practicing safe sex.  Taking low doses of aspirin every day.  Taking vitamin and mineral supplements as recommended by your health care provider. What happens during an annual well check? The services and screenings done by your health care provider during your annual well check will depend on your age, overall health, lifestyle risk factors, and family history of disease. Counseling  Your health care provider may ask you questions about your:  Alcohol use.  Tobacco  use.  Drug use.  Emotional well-being.  Home and relationship well-being.  Sexual activity.  Eating habits.  History of falls.  Memory and ability to understand (cognition).  Work and work Statistician. Screening  You may have the following tests or measurements:  Height, weight, and BMI.  Blood pressure.  Lipid and cholesterol levels. These may be checked every 5 years, or more frequently if you are over 6 years old.  Skin check.  Lung cancer screening. You may have this screening every year starting at age 35 if you have a 30-pack-year history of smoking and currently smoke or have quit within the past 15 years.  Fecal occult blood test (FOBT) of the stool. You may have this test every year starting at age 42.  Flexible sigmoidoscopy or colonoscopy. You may have a sigmoidoscopy every 5 years or a colonoscopy every 10 years starting at age 67.  Prostate cancer screening. Recommendations will vary depending on your family history and other risks.  Hepatitis C blood test.  Hepatitis B blood test.  Sexually transmitted disease (STD) testing.  Diabetes screening. This is done by checking your blood sugar (glucose) after you have not eaten for a while (fasting). You may have this done every 1-3 years.  Abdominal aortic aneurysm (AAA) screening. You may need this if you are a current or former smoker.  Osteoporosis. You may be screened starting at age 75 if you are at high risk. Talk with your health care provider about your test results, treatment options, and if necessary, the need for more tests. Vaccines  Your health care provider may recommend certain vaccines, such as:  Influenza vaccine. This is recommended every year.  Tetanus, diphtheria,  and acellular pertussis (Tdap, Td) vaccine. You may need a Td booster every 10 years.  Zoster vaccine. You may need this after age 81.  Pneumococcal 13-valent conjugate (PCV13) vaccine. One dose is recommended after age  29.  Pneumococcal polysaccharide (PPSV23) vaccine. One dose is recommended after age 35. Talk to your health care provider about which screenings and vaccines you need and how often you need them. This information is not intended to replace advice given to you by your health care provider. Make sure you discuss any questions you have with your health care provider. Document Released: 07/03/2015 Document Revised: 02/24/2016 Document Reviewed: 04/07/2015 Elsevier Interactive Patient Education  2017 [employer] Prevention in the Home Falls can cause injuries. They can happen to people of all ages. There are many things you can do to make your home safe and to help prevent falls. What can I do on the outside of my home?  Regularly fix the edges of walkways and driveways and fix any cracks.  Remove anything that might make you trip as you walk through a door, such as a raised step or threshold.  Trim any bushes or trees on the path to your home.  Use bright outdoor lighting.  Clear any walking paths of anything that might make someone trip, such as rocks or tools.  Regularly check to see if handrails are loose or broken. Make sure that both sides of any steps have handrails.  Any raised decks and porches should have guardrails on the edges.  Have any leaves, snow, or ice cleared regularly.  Use sand or salt on walking paths during winter.  Clean up any spills in your garage right away. This includes oil or grease spills. What can I do in the bathroom?  Use night lights.  Install grab bars by the toilet and in the tub and shower. Do not use towel bars as grab bars.  Use non-skid mats or decals in the tub or shower.  If you need to sit down in the shower, use a plastic, non-slip stool.  Keep the floor dry. Clean up any water that spills on the floor as soon as it happens.  Remove soap buildup in the tub or shower regularly.  Attach bath mats securely with double-sided  non-slip rug tape.  Do not have throw rugs and other things on the floor that can make you trip. What can I do in the bedroom?  Use night lights.  Make sure that you have a light by your bed that is easy to reach.  Do not use any sheets or blankets that are too big for your bed. They should not hang down onto the floor.  Have a firm chair that has side arms. You can use this for support while you get dressed.  Do not have throw rugs and other things on the floor that can make you trip. What can I do in the kitchen?  Clean up any spills right away.  Avoid walking on wet floors.  Keep items that you use a lot in easy-to-reach places.  If you need to reach something above you, use a strong step stool that has a grab bar.  Keep electrical cords out of the way.  Do not use floor polish or wax that makes floors slippery. If you must use wax, use non-skid floor wax.  Do not have throw rugs and other things on the floor that can make you trip. What can I do with my  stairs?  Do not leave any items on the stairs.  Make sure that there are handrails on both sides of the stairs and use them. Fix handrails that are broken or loose. Make sure that handrails are as long as the stairways.  Check any carpeting to make sure that it is firmly attached to the stairs. Fix any carpet that is loose or worn.  Avoid having throw rugs at the top or bottom of the stairs. If you do have throw rugs, attach them to the floor with carpet tape.  Make sure that you have a light switch at the top of the stairs and the bottom of the stairs. If you do not have them, ask someone to add them for you. What else can I do to help prevent falls?  Wear shoes that:  Do not have high heels.  Have rubber bottoms.  Are comfortable and fit you well.  Are closed at the toe. Do not wear sandals.  If you use a stepladder:  Make sure that it is fully opened. Do not climb a closed stepladder.  Make sure that both  sides of the stepladder are locked into place.  Ask someone to hold it for you, if possible.  Clearly mark and make sure that you can see:  Any grab bars or handrails.  First and last steps.  Where the edge of each step is.  Use tools that help you move around (mobility aids) if they are needed. These include:  Canes.  Walkers.  Scooters.  Crutches.  Turn on the lights when you go into a dark area. Replace any light bulbs as soon as they burn out.  Set up your furniture so you have a clear path. Avoid moving your furniture around.  If any of your floors are uneven, fix them.  If there are any pets around you, be aware of where they are.  Review your medicines with your doctor. Some medicines can make you feel dizzy. This can increase your chance of falling. Ask your doctor what other things that you can do to help prevent falls. This information is not intended to replace advice given to you by your health care provider. Make sure you discuss any questions you have with your health care provider. Document Released: 04/02/2009 Document Revised: 11/12/2015 Document Reviewed: 07/11/2014 Elsevier Interactive Patient Education  2017 [employer].

## 2020-11-23 NOTE — Progress Notes (Signed)
Subjective:   Bradley Navarro is a 80 y.o. male who presents for Medicare Annual/Subsequent preventive examination.  I connected with Walter today by telephone and verified that I am speaking with the correct person using two identifiers. Location patient: home Location provider: work Persons participating in the virtual visit: patient, Marine scientist.    I discussed the limitations, risks, security and privacy concerns of performing an evaluation and management service by telephone and the availability of in person appointments. I also discussed with the patient that there may be a patient responsible charge related to this service. The patient expressed understanding and verbally consented to this telephonic visit.    Interactive audio and video telecommunications were attempted between this provider and patient, however failed, due to patient having technical difficulties OR patient did not have access to video capability.  We continued and completed visit with audio only.  Some vital signs may be absent or patient reported.   Time Spent with patient on telephone encounter: 20 minutes   Review of Systems     Cardiac Risk Factors include: advanced age (>37men, >83 women);male gender;obesity (BMI >30kg/m2);hypertension;dyslipidemia     Objective:    Today's Vitals   11/23/20 0949  Weight: 216 lb (98 kg)  Height: 5\' 10"  (1.778 m)   Body mass index is 30.99 kg/m.  Advanced Directives 11/23/2020 10/03/2019 04/20/2015  Does Patient Have a Medical Advance Directive? Yes Yes No  Type of Paramedic of [place];Living will [place];Living will -  Does patient want to make changes to medical advance directive? - No - Patient declined -  Copy of [place] in Chart? No - copy requested No - copy requested -  Would patient like information on creating a medical advance directive? - - Yes - Educational materials given    Current  Medications (verified) Outpatient Encounter Medications as of 11/23/2020  Medication Sig  . atorvastatin (LIPITOR) 10 MG tablet Take 1 tablet (10 mg total) by mouth at bedtime.  Marland Kitchen HYDROcodone-acetaminophen (NORCO) 10-325 MG per tablet Take 1 tablet by mouth every 6 (six) hours as needed.  Marland Kitchen losartan (COZAAR) 100 MG tablet Take 1 tablet (100 mg total) by mouth daily.  Marland Kitchen morphine (MS CONTIN) 30 MG 12 hr tablet Take 30 mg by mouth in the morning and at bedtime.   . Multiple Vitamin (MULITIVITAMIN WITH MINERALS) TABS Take 1 tablet by mouth daily.  Marland Kitchen omeprazole (PRILOSEC) 20 MG capsule Take 1 capsule (20 mg total) by mouth 2 (two) times daily.  . tamsulosin (FLOMAX) 0.4 MG CAPS capsule Take 0.4 mg by mouth daily.  . tizanidine (ZANAFLEX) 2 MG capsule Take 2-4 mg by mouth every 8 (eight) hours as needed. Not to exceed 3 doses in 24 hours.   No facility-administered encounter medications on file as of 11/23/2020.    Allergies (verified) Penicillins   History: Past Medical History:  Diagnosis Date  . Chronic back pain   . Elevated BP without diagnosis of hypertension 04/24/2017  . History of headache    frontal lobe cluster headaches  . History of kidney stones   . Post laminectomy syndrome   . SCC (squamous cell carcinoma) 01/2020  . Sciatica of right side 2016   S/P L5-S1 revision lam/PSF on 04-10-12 w/ relief of RLE pain post op, MRI 01/2014 shows minimal foraminal stenosis at L4-5 w/o recurrent stenosis at L5-S1   Past Surgical History:  Procedure Laterality Date  . APPENDECTOMY    .  LUMBAR DISC SURGERY  2012   L-5, Dr Vennie Homans  . LUMBAR FUSION  2015   rod, plates  . r arm surger     Family History  Problem Relation Age of Onset  . CAD Father 35       MI   . Dementia Mother   . Colon cancer Neg Hx   . Prostate cancer Neg Hx   . Diabetes Neg Hx    Social History   Socioeconomic History  . Marital status: Married    Spouse name: Not on file  . Number of children: 2  . Years  of education: Not on file  . Highest education level: Not on file  Occupational History  . Occupation: retired  Tobacco Use  . Smoking status: Former Research scientist (life sciences)  . Smokeless tobacco: Never Used  . Tobacco comment: quit in the 90, light smoker   Substance and Sexual Activity  . Alcohol use: No    Alcohol/week: 0.0 standard drinks  . Drug use: No  . Sexual activity: Not on file  Other Topics Concern  . Not on file  Social History Narrative   Household: pt, wife, 1 cats   children 2 and 2 step daughters, has g-kids   Investment banker, operational of Radio broadcast assistant Strain: Low Risk   . Difficulty of Paying Living Expenses: Not very hard  Food Insecurity: No Food Insecurity  . Worried About Charity fundraiser in the Last Year: Never true  . Ran Out of Food in the Last Year: Never true  Transportation Needs: No Transportation Needs  . Lack of Transportation (Medical): No  . Lack of Transportation (Non-Medical): No  Physical Activity: Inactive  . Days of Exercise per Week: 0 days  . Minutes of Exercise per Session: 0 min  Stress: No Stress Concern Present  . Feeling of Stress : Only a little  Social Connections: Socially Isolated  . Frequency of Communication with Friends and Family: Once a week  . Frequency of Social Gatherings with Friends and Family: Once a week  . Attends Religious Services: Never  . Active Member of Clubs or Organizations: No  . Attends Archivist Meetings: Never  . Marital Status: Married    Tobacco Counseling Counseling given: Not Answered Comment: quit in the 90, light smoker    Clinical Intake:  Pre-visit preparation completed: Yes  Pain : No/denies pain     Nutritional Status: BMI > 30  Obese Nutritional Risks: None Diabetes: No  How often do you need to have someone help you when you read instructions, pamphlets, or other written materials from your doctor or pharmacy?: 1 - Never  Diabetic?No  Interpreter Needed?:  No  Information entered by :: Caroleen Hamman LPN   Activities of Daily Living In your present state of health, do you have any difficulty performing the following activities: 11/23/2020 07/21/2020  Hearing? N N  Vision? N N  Difficulty concentrating or making decisions? N N  Walking or climbing stairs? N N  Dressing or bathing? N N  Doing errands, shopping? N N  Preparing Food and eating ? N -  Using the Toilet? N -  In the past six months, have you accidently leaked urine? N -  Do you have problems with loss of bowel control? N -  Managing your Medications? N -  Managing your Finances? N -  Housekeeping or managing your Housekeeping? N -  Some recent data might be hidden    Patient  Care Team: Colon Branch, MD as PCP - General Czinsky, Stephani Police, [place] as Physician Assistant (Orthopedic Surgery) Zonia Kief, MD as Consulting Physician (Rehabilitation) Lucas Mallow, MD as Consulting Physician (Urology) [place], Jennefer Bravo, MD as Referring Physician (Dermatology) Cherre Robins, PharmD (Pharmacist)  Indicate any recent Medical Services you may have received from other than [facility] providers in the past year (date may be approximate).     Assessment:   This is a routine wellness examination for Leroy.  Hearing/Vision screen  Hearing Screening   125Hz  250Hz  500Hz  1000Hz  2000Hz  3000Hz  4000Hz  6000Hz  8000Hz   Right ear:           Left ear:           Comments: C/o hearing loss  Vision Screening Comments: Reading glasses Last eye exam-years ago  Dietary issues and exercise activities discussed: Exercise limited by: None identified  Goals Addressed              This Visit's Progress     Patient Stated   .  Continue eating healthy and remain active.  (pt-stated)   Not on track     Other   .  DIET - INCREASE WATER INTAKE   On track     Depression Screen PHQ 2/9 Scores 11/23/2020 07/21/2020 10/03/2019 05/02/2019 04/30/2018 04/24/2017 04/19/2016  PHQ - 2 Score 0 0 0  0 0 0 0  PHQ- 9 Score - 2 - - - - -    Fall Risk Fall Risk  11/23/2020 07/21/2020 10/03/2019 05/02/2019 04/30/2018  Falls in the past year? 0 0 0 0 0  Number falls in past yr: 0 0 0 - -  Injury with Fall? 0 0 0 - -  Risk Factor Category  - - - - -  Risk for fall due to : - - - - -  Risk for fall due to: Comment - - - - -  Follow up Falls prevention discussed - Education provided;Falls prevention discussed Falls evaluation completed Falls evaluation completed;Education provided;Falls prevention discussed    FALL RISK PREVENTION PERTAINING TO THE HOME:  Any stairs in or around the home? Yes  If so, are there any without handrails? No  Home free of loose throw rugs in walkways, pet beds, electrical cords, etc? Yes  Adequate lighting in your home to reduce risk of falls? Yes   ASSISTIVE DEVICES UTILIZED TO PREVENT FALLS:  Life alert? No  Use of a cane, walker or w/c? No  Grab bars in the bathroom? Yes  Shower chair or bench in shower? No  Elevated toilet seat or a handicapped toilet? No   TIMED UP AND GO:  Was the test performed? No . Phone visit   Cognitive Function:Normal cognitive status assessed by this Nurse Health Advisor. No abnormalities found.   MMSE - Mini Mental State Exam 04/20/2015  Orientation to time 5  Orientation to Place 5  Registration 3  Attention/ Calculation 5  Recall 3  Language- name 2 objects 2  Language- repeat 1  Language- follow 3 step command 3  Language- read & follow direction 1  Write a sentence 1  Copy design 1  Total score 30        Immunizations Immunization History  Administered Date(s) Administered  . Fluad Quad(high Dose 65+) 05/02/2019, 03/26/2020  . Influenza Split 03/30/2011, 03/28/2012  . Influenza Whole 03/02/2010  . Influenza, High Dose Seasonal PF 03/17/2015, 04/19/2016, 03/16/2017, 03/26/2018  . Influenza,inj,Quad PF,6+ Mos 03/20/2013, 03/25/2014  .  PFIZER(Purple Top)SARS-COV-2 Vaccination 08/02/2019, 08/24/2019,  03/20/2020  . Pneumococcal Conjugate-13 04/17/2015  . Pneumococcal Polysaccharide-23 03/02/2010, 07/21/2020  . Td 04/17/2015    TDAP status: Up to date  Flu Vaccine status: Up to date  Pneumococcal vaccine status: Up to date  Covid-19 vaccine status: Completed vaccines  Qualifies for Shingles Vaccine? Yes   Zostavax completed No   Shingrix Completed?: No.    Education has been provided regarding the importance of this vaccine. Patient has been advised to call insurance company to determine out of pocket expense if they have not yet received this vaccine. Advised may also receive vaccine at local pharmacy or Health Dept. Verbalized acceptance and understanding.  Screening Tests Health Maintenance  Topic Date Due  . Pneumococcal Vaccine 6-46 Years old (1 of 4 - PCV13) Never done  . Hepatitis C Screening  Never done  . Zoster Vaccines- Shingrix (1 of 2) Never done  . COVID-19 Vaccine (4 - Booster for [place] series) 06/20/2020  . INFLUENZA VACCINE  01/18/2021  . TETANUS/TDAP  04/16/2025  . PNA vac Low Risk Adult  Completed  . HPV VACCINES  Aged Out    Health Maintenance  Health Maintenance Due  Topic Date Due  . Pneumococcal Vaccine 75-75 Years old (1 of 4 - PCV13) Never done  . Hepatitis C Screening  Never done  . Zoster Vaccines- Shingrix (1 of 2) Never done  . COVID-19 Vaccine (4 - Booster for Pfizer series) 06/20/2020    Colorectal cancer screening: No longer required.   Lung Cancer Screening: (Low Dose CT Chest recommended if Age 34-80 years, 30 pack-year currently smoking OR have quit w/in 15years.) does not qualify.    Additional Screening:  Hepatitis C Screening: does not qualify  Vision Screening: Recommended annual ophthalmology exams for early detection of glaucoma and other disorders of the eye. Is the patient up to date with their annual eye exam?  No  Who is the provider or what is the name of the office in which the patient attends annual eye exams?  unsure Patient advised to make an appt.   Dental Screening: Recommended annual dental exams for proper oral hygiene  Community Resource Referral / Chronic Care Management: CRR required this visit?  No   CCM required this visit?  No      Plan:     I have personally reviewed and noted the following in the patient's chart:   . Medical and social history . Use of alcohol, tobacco or illicit drugs  . Current medications and supplements including opioid prescriptions. Patient is not currently taking opioid prescriptions. . Functional ability and status . Nutritional status . Physical activity . Advanced directives . List of other physicians . Hospitalizations, surgeries, and ER visits in previous 12 months . Vitals . Screenings to include cognitive, depression, and falls . Referrals and appointments  In addition, I have reviewed and discussed with patient certain preventive protocols, quality metrics, and best practice recommendations. A written personalized care plan for preventive services as well as general preventive health recommendations were provided to patient.   Due to this being a telephonic visit, the after visit summary with patients personalized plan was offered to patient via mail or my-chart. Patient declined at this time.   Marta Antu, LPN   06/26/6158  Nurse Health Advisor  Nurse Notes: None

## 2020-12-11 DIAGNOSIS — M4807 Spinal stenosis, lumbosacral region: Secondary | ICD-10-CM | POA: Diagnosis not present

## 2020-12-11 DIAGNOSIS — M5431 Sciatica, right side: Secondary | ICD-10-CM | POA: Diagnosis not present

## 2021-01-18 ENCOUNTER — Ambulatory Visit (INDEPENDENT_AMBULATORY_CARE_PROVIDER_SITE_OTHER): Payer: PPO | Admitting: Internal Medicine

## 2021-01-18 ENCOUNTER — Other Ambulatory Visit: Payer: Self-pay

## 2021-01-18 ENCOUNTER — Encounter: Payer: Self-pay | Admitting: Internal Medicine

## 2021-01-18 VITALS — BP 122/68 | HR 68 | Temp 98.0°F | Resp 16 | Ht 70.0 in | Wt 218.1 lb

## 2021-01-18 DIAGNOSIS — I10 Essential (primary) hypertension: Secondary | ICD-10-CM

## 2021-01-18 DIAGNOSIS — E119 Type 2 diabetes mellitus without complications: Secondary | ICD-10-CM

## 2021-01-18 DIAGNOSIS — E7800 Pure hypercholesterolemia, unspecified: Secondary | ICD-10-CM

## 2021-01-18 NOTE — Patient Instructions (Addendum)
Please make some changes on your diet to help your diabetes  Consider getting the shingles shot at the local pharmacy  Get a flu shot this fall    GO TO THE LAB : Get the blood work     [place], [place] back for a physical exam by 07-2021

## 2021-01-18 NOTE — Progress Notes (Signed)
Subjective:    Patient ID: Bradley Navarro, male    DOB: 11-24-1940, 80 y.o.   MRN: [id number]  DOS:  01/18/2021 Type of visit - description: f/u  Since the last office visit is doing well.  Review of Systems Chronic back pain, under better control. Emotionally doing well. He is a caregiver for his wife however no apparent caregiver fatigue. Ambulatory BPs within normal  Past Medical History:  Diagnosis Date   Chronic back pain    Elevated BP without diagnosis of hypertension 04/24/2017   History of headache    frontal lobe cluster headaches   History of kidney stones    Post laminectomy syndrome    SCC (squamous cell carcinoma) 01/2020   Sciatica of right side 2016   S/P L5-S1 revision lam/PSF on 04-10-12 w/ relief of RLE pain post op, MRI 01/2014 shows minimal foraminal stenosis at L4-5 w/o recurrent stenosis at L5-S1    Past Surgical History:  Procedure Laterality Date   [place] SURGERY  2012   L-5, Dr Vennie Homans   LUMBAR FUSION  2015   rod, plates   r arm surger      Allergies as of 01/18/2021       Reactions   Penicillins Swelling   redness diffusely 1962        Medication List        Accurate as of January 18, 2021 11:59 PM. If you have any questions, ask your nurse or doctor.          STOP taking these medications    HYDROcodone-acetaminophen 10-325 MG tablet Commonly known as: [place] by: Kathlene November, MD   tizanidine 2 MG capsule Commonly known as: ZANAFLEX Stopped by: Kathlene November, MD       TAKE these medications    atorvastatin 10 MG tablet Commonly known as: LIPITOR Take 1 tablet (10 mg total) by mouth at bedtime.   losartan 100 MG tablet Commonly known as: COZAAR Take 1 tablet (100 mg total) by mouth daily.   morphine 30 MG 12 hr tablet Commonly known as: MS CONTIN Take 30 mg by mouth in the morning and at bedtime.   multivitamin with minerals Tabs tablet Take 1 tablet by mouth daily.   omeprazole 20 MG  capsule Commonly known as: PRILOSEC Take 1 capsule (20 mg total) by mouth 2 (two) times daily. What changed: when to take this   tamsulosin 0.4 MG Caps capsule Commonly known as: FLOMAX Take 0.4 mg by mouth daily.           Objective:   Physical Exam BP 122/68 (BP Location: Left Arm, Patient Position: Sitting, Cuff Size: Normal)   Pulse 68   Temp 98 F (36.7 C) (Oral)   Resp 16   Ht '5\' 10"'$  (1.778 m)   Wt 218 lb 2 oz (98.9 kg)   SpO2 96%   BMI 31.30 kg/m  General:   Well developed, NAD, BMI noted. HEENT:  Normocephalic . Face symmetric, atraumatic Lungs:  CTA B Normal respiratory effort, no intercostal retractions, no accessory muscle use. Heart: RRR,  no murmur.  Lower extremities: no pretibial edema bilaterally  Skin: Not pale. Not jaundice Neurologic:  alert & oriented X3.  Speech normal, gait appropriate for age and unassisted Psych--  Cognition and judgment appear intact.  Cooperative with normal attention span and concentration.  Behavior appropriate. No anxious or depressed appearing.      Assessment    Assessment  DM (A1c increased to 6.9 on 07-2020) HTN High cholesterol h/o HA-- s/p extensive w/u in the past. Has seen Dr Maureen Chatters and neuro @ [facility]   h/o kidney stones , multiple Chronic back pain-- sees pain management, see surgeries Chronic hoarseness: Never seen by ENT Sees dermatology SCC Abnormal EKG: TWI, RBBB, see note from 05-2018 BPH, LUTS: Saw urology, last visit 2020.   PLAN DM: He is physically active, lots of room for improvement on diet.  Counseling about elimination of regular sodas, avoid commercial juices; check A1c. HTN: Seems well controlled, BMP and CBC today, continue losartan. Preventive care: Check hep C, recommend shingles vaccine, flu shot in the fall. RTC CPX 07/2021.  Sooner if needed.   This visit occurred during the SARS-CoV-2 public health emergency.  Safety protocols were in place, including screening questions  prior to the visit, additional usage of staff PPE, and extensive cleaning of exam room while observing appropriate contact time as indicated for disinfecting solutions.

## 2021-01-19 LAB — BASIC METABOLIC PANEL
BUN: 19 mg/dL (ref 6–23)
CO2: 27 mEq/L (ref 19–32)
Calcium: 9.1 mg/dL (ref 8.4–10.5)
Chloride: 105 mEq/L (ref 96–112)
Creatinine, Ser: 0.95 mg/dL (ref 0.40–1.50)
GFR: 76.45 mL/min (ref >60.00)
Glucose, Bld: 138 mg/dL — ABNORMAL HIGH (ref 70–99)
Potassium: 4.3 mEq/L (ref 3.5–5.1)
Sodium: 142 mEq/L (ref 135–145)

## 2021-01-19 LAB — CBC WITH DIFFERENTIAL/PLATELET
Basophils Absolute: 0 10*3/uL (ref 0.0–0.1)
Basophils Relative: 0.7 % (ref 0.0–3.0)
Eosinophils Absolute: 0.3 10*3/uL (ref 0.0–0.7)
Eosinophils Relative: 4.4 % (ref 0.0–5.0)
HCT: 37.8 % — ABNORMAL LOW (ref 39.0–52.0)
Hemoglobin: 13.1 g/dL (ref 13.0–17.0)
Lymphocytes Relative: 42 % (ref 12.0–46.0)
Lymphs Abs: 2.5 10*3/uL (ref 0.7–4.0)
MCHC: 34.6 g/dL (ref 30.0–36.0)
MCV: 91.8 fl (ref 78.0–100.0)
Monocytes Absolute: 0.5 10*3/uL (ref 0.1–1.0)
Monocytes Relative: 8.4 % (ref 3.0–12.0)
Neutro Abs: 2.6 10*3/uL (ref 1.4–7.7)
Neutrophils Relative %: 44.5 % (ref 43.0–77.0)
Platelets: 225 10*3/uL (ref 150.0–400.0)
RBC: 4.11 Mil/uL — ABNORMAL LOW (ref 4.22–5.81)
RDW: 13.1 % (ref 11.5–15.5)
WBC: 5.9 10*3/uL (ref 4.0–10.5)

## 2021-01-19 LAB — HEMOGLOBIN A1C: Hgb A1c MFr Bld: 6.4 % (ref 4.6–6.5)

## 2021-01-20 NOTE — Assessment & Plan Note (Signed)
DM: He is physically active, lots of room for improvement on diet.  Counseling about elimination of regular sodas, avoid commercial juices; check A1c. HTN: Seems well controlled, BMP and CBC today, continue losartan. Preventive care: Check hep C, recommend shingles vaccine, flu shot in the fall. RTC CPX 07/2021.  Sooner if needed.

## 2021-02-12 DIAGNOSIS — Z79899 Other long term (current) drug therapy: Secondary | ICD-10-CM | POA: Diagnosis not present

## 2021-02-12 DIAGNOSIS — G894 Chronic pain syndrome: Secondary | ICD-10-CM | POA: Diagnosis not present

## 2021-02-12 DIAGNOSIS — M4807 Spinal stenosis, lumbosacral region: Secondary | ICD-10-CM | POA: Diagnosis not present

## 2021-02-12 DIAGNOSIS — Z79891 Long term (current) use of opiate analgesic: Secondary | ICD-10-CM | POA: Diagnosis not present

## 2021-02-12 DIAGNOSIS — M5431 Sciatica, right side: Secondary | ICD-10-CM | POA: Diagnosis not present

## 2021-02-17 ENCOUNTER — Other Ambulatory Visit: Payer: Self-pay | Admitting: Internal Medicine

## 2021-02-17 MED ORDER — LOSARTAN POTASSIUM 100 MG PO TABS: 100.0000 mg | ORAL_TABLET | Freq: Every day | ORAL | 1 refills | Status: DC

## 2021-03-12 ENCOUNTER — Encounter: Payer: Self-pay | Admitting: Family Medicine

## 2021-03-12 ENCOUNTER — Ambulatory Visit (INDEPENDENT_AMBULATORY_CARE_PROVIDER_SITE_OTHER): Payer: PPO | Admitting: Family Medicine

## 2021-03-12 ENCOUNTER — Other Ambulatory Visit: Payer: Self-pay

## 2021-03-12 VITALS — BP 118/68 | HR 92 | Temp 97.9°F | Ht 70.0 in | Wt 215.0 lb

## 2021-03-12 DIAGNOSIS — R202 Paresthesia of skin: Secondary | ICD-10-CM

## 2021-03-12 LAB — MAGNESIUM: Magnesium: 2.1 mg/dL (ref 1.5–2.5)

## 2021-03-12 LAB — TSH: TSH: 5.06 mIU/L — ABNORMAL HIGH (ref 0.40–4.50)

## 2021-03-12 LAB — VITAMIN B12: Vitamin B-12: 566 pg/mL (ref 200–1100)

## 2021-03-12 LAB — VITAMIN D 25 HYDROXY (VIT D DEFICIENCY, FRACTURES): Vit D, 25-Hydroxy: 28 ng/mL — ABNORMAL LOW (ref 30–100)

## 2021-03-12 MED ORDER — DULOXETINE HCL 20 MG PO CPEP: 20.0000 mg | ORAL_CAPSULE | Freq: Every day | ORAL | 3 refills | Status: DC

## 2021-03-12 NOTE — Progress Notes (Signed)
Chief Complaint  Patient presents with   Foot Pain    Subjective: Patient is a 80 y.o. male here for worsening tingling in his feet.  Hx of paresthesias. Worse over the past 3 weeks. He has a hx of sciatica and chronic LBP but that pain is no worse than usual. He is physically active but does not stretch routinely. No recent inj or change in activity. Balance is slightly off.   Past Medical History:  Diagnosis Date   Chronic back pain    Elevated BP without diagnosis of hypertension 04/24/2017   History of headache    frontal lobe cluster headaches   History of kidney stones    Post laminectomy syndrome    SCC (squamous cell carcinoma) 01/2020   Sciatica of right side 2016   S/P L5-S1 revision lam/PSF on 04-10-12 w/ relief of RLE pain post op, MRI 01/2014 shows minimal foraminal stenosis at L4-5 w/o recurrent stenosis at L5-S1    Objective: BP 118/68   Pulse 92   Temp 97.9 F (36.6 C) (Oral)   Ht 5\' 10"  (1.778 m)   Wt 215 lb (97.5 kg)   SpO2 98%   BMI 30.85 kg/m  General: Awake, appears stated age HEENT: MMM, EOMi, ears neg, nares patent Heart: RRR, no murmurs, no LE edema Lungs: No accessory muscle use Neuro: 5/5 strength throughout in LE's.  Neg straight leg b/l Psych: Age appropriate judgment and insight, normal affect and mood  Assessment and Plan: Paresthesias - Plan: DULoxetine (CYMBALTA) 20 MG capsule, TSH, VITAMIN D 25 Hydroxy (Vit-D Deficiency, Fractures), B12, Magnesium  Worsening chronic issue. Ck labs. A1c reviewed, controlled. Will start Cymbalta 20 mg/d. F/u in 1 mo w Dr. Larose Kells or me.  The patient voiced understanding and agreement to the plan.  [place], DO 03/12/21  2:44 PM

## 2021-03-12 NOTE — Patient Instructions (Signed)
Give us 2-3 business days to get the results of your labs back.   Heat (pad or rice pillow in microwave) over affected area, 10-15 minutes twice daily.   Ice/cold pack over area for 10-15 min twice daily.  OK to take Tylenol 1000 mg (2 extra strength tabs) or 975 mg (3 regular strength tabs) every 6 hours as needed.  Let us know if you need anything.  EXERCISES  RANGE OF MOTION (ROM) AND STRETCHING EXERCISES - Low Back Pain Most people with lower back pain will find that their symptoms get worse with excessive bending forward (flexion) or arching at the lower back (extension). The exercises that will help resolve your symptoms will focus on the opposite motion.  If you have pain, numbness or tingling which travels down into your buttocks, leg or foot, the goal of the therapy is for these symptoms to move closer to your back and eventually resolve. Sometimes, these leg symptoms will get better, but your lower back pain may worsen. This is often an indication of progress in your rehabilitation. Be very alert to any changes in your symptoms and the activities in which you participated in the 24 hours prior to the change. Sharing this information with your caregiver will allow him or her to most efficiently treat your condition. These exercises may help you when beginning to rehabilitate your injury. Your symptoms may resolve with or without further involvement from your physician, physical therapist or athletic trainer. While completing these exercises, remember:  Restoring tissue flexibility helps normal motion to return to the joints. This allows healthier, less painful movement and activity. An effective stretch should be held for at least 30 seconds. A stretch should never be painful. You should only feel a gentle lengthening or release in the stretched tissue. FLEXION RANGE OF MOTION AND STRETCHING EXERCISES:  STRETCH - Flexion, Single Knee to Chest  Lie on a firm bed or floor with both legs  extended in front of you. Keeping one leg in contact with the floor, bring your opposite knee to your chest. Hold your leg in place by either grabbing behind your thigh or at your knee. Pull until you feel a gentle stretch in your low back. Hold 30 seconds. Slowly release your grasp and repeat the exercise with the opposite side. Repeat 2 times. Complete this exercise 3 times per week.   STRETCH - Flexion, Double Knee to Chest Lie on a firm bed or floor with both legs extended in front of you. Keeping one leg in contact with the floor, bring your opposite knee to your chest. Tense your stomach muscles to support your back and then lift your other knee to your chest. Hold your legs in place by either grabbing behind your thighs or at your knees. Pull both knees toward your chest until you feel a gentle stretch in your low back. Hold 30 seconds. Tense your stomach muscles and slowly return one leg at a time to the floor. Repeat 2 times. Complete this exercise 3 times per week.   STRETCH - Low Trunk Rotation Lie on a firm bed or floor. Keeping your legs in front of you, bend your knees so they are both pointed toward the ceiling and your feet are flat on the floor. Extend your arms out to the side. This will stabilize your upper body by keeping your shoulders in contact with the floor. Gently and slowly drop both knees together to one side until you feel a gentle stretch in your   low back. Hold for 30 seconds. Tense your stomach muscles to support your lower back as you bring your knees back to the starting position. Repeat the exercise to the other side. Repeat 2 times. Complete this exercise at least 3 times per week.   EXTENSION RANGE OF MOTION AND FLEXIBILITY EXERCISES:  STRETCH - Extension, Prone on Elbows  Lie on your stomach on the floor, a bed will be too soft. Place your palms about shoulder width apart and at the height of your head. Place your elbows under your shoulders. If this is  too painful, stack pillows under your chest. Allow your body to relax so that your hips drop lower and make contact more completely with the floor. Hold this position for 30 seconds. Slowly return to lying flat on the floor. Repeat 2 times. Complete this exercise 3 times per week.   RANGE OF MOTION - Extension, Prone Press Ups Lie on your stomach on the floor, a bed will be too soft. Place your palms about shoulder width apart and at the height of your head. Keeping your back as relaxed as possible, slowly straighten your elbows while keeping your hips on the floor. You may adjust the placement of your hands to maximize your comfort. As you gain motion, your hands will come more underneath your shoulders. Hold this position 30 seconds. Slowly return to lying flat on the floor. Repeat 2 times. Complete this exercise 3 times per week.   RANGE OF MOTION- Quadruped, Neutral Spine  Assume a hands and knees position on a firm surface. Keep your hands under your shoulders and your knees under your hips. You may place padding under your knees for comfort. Drop your head and point your tailbone toward the ground below you. This will round out your lower back like an angry cat. Hold this position for 30 seconds. Slowly lift your head and release your tail bone so that your back sags into a large arch, like an old horse. Hold this position for 30 seconds. Repeat this until you feel limber in your low back. Now, find your "sweet spot." This will be the most comfortable position somewhere between the two previous positions. This is your neutral spine. Once you have found this position, tense your stomach muscles to support your low back. Hold this position for 30 seconds. Repeat 2 times. Complete this exercise 3 times per week.   STRENGTHENING EXERCISES - Low Back Sprain These exercises may help you when beginning to rehabilitate your injury. These exercises should be done near your "sweet spot." This is  the neutral, low-back arch, somewhere between fully rounded and fully arched, that is your least painful position. When performed in this safe range of motion, these exercises can be used for people who have either a flexion or extension based injury. These exercises may resolve your symptoms with or without further involvement from your physician, physical therapist or athletic trainer. While completing these exercises, remember:  Muscles can gain both the endurance and the strength needed for everyday activities through controlled exercises. Complete these exercises as instructed by your physician, physical therapist or athletic trainer. Increase the resistance and repetitions only as guided. You may experience muscle soreness or fatigue, but the pain or discomfort you are trying to eliminate should never worsen during these exercises. If this pain does worsen, stop and make certain you are following the directions exactly. If the pain is still present after adjustments, discontinue the exercise until you can discuss the trouble   with your caregiver.  STRENGTHENING - Deep Abdominals, Pelvic Tilt  Lie on a firm bed or floor. Keeping your legs in front of you, bend your knees so they are both pointed toward the ceiling and your feet are flat on the floor. Tense your lower abdominal muscles to press your low back into the floor. This motion will rotate your pelvis so that your tail bone is scooping upwards rather than pointing at your feet or into the floor. With a gentle tension and even breathing, hold this position for 3 seconds. Repeat 2 times. Complete this exercise 3 times per week.   STRENGTHENING - Abdominals, Crunches  Lie on a firm bed or floor. Keeping your legs in front of you, bend your knees so they are both pointed toward the ceiling and your feet are flat on the floor. Cross your arms over your chest. Slightly tip your chin down without bending your neck. Tense your abdominals and slowly  lift your trunk high enough to just clear your shoulder blades. Lifting higher can put excessive stress on the lower back and does not further strengthen your abdominal muscles. Control your return to the starting position. Repeat 2 times. Complete this exercise 3 times per week.   STRENGTHENING - Quadruped, Opposite UE/LE Lift  Assume a hands and knees position on a firm surface. Keep your hands under your shoulders and your knees under your hips. You may place padding under your knees for comfort. Find your neutral spine and gently tense your abdominal muscles so that you can maintain this position. Your shoulders and hips should form a rectangle that is parallel with the floor and is not twisted. Keeping your trunk steady, lift your right hand no higher than your shoulder and then your left leg no higher than your hip. Make sure you are not holding your breath. Hold this position for 30 seconds. Continuing to keep your abdominal muscles tense and your back steady, slowly return to your starting position. Repeat with the opposite arm and leg. Repeat 2 times. Complete this exercise 3 times per week.   STRENGTHENING - Abdominals and Quadriceps, Straight Leg Raise  Lie on a firm bed or floor with both legs extended in front of you. Keeping one leg in contact with the floor, bend the other knee so that your foot can rest flat on the floor. Find your neutral spine, and tense your abdominal muscles to maintain your spinal position throughout the exercise. Slowly lift your straight leg off the floor about 6 inches for a count of 3, making sure to not hold your breath. Still keeping your neutral spine, slowly lower your leg all the way to the floor. Repeat this exercise with each leg 2 times. Complete this exercise 3 times per week.  POSTURE AND BODY MECHANICS CONSIDERATIONS - Low Back Sprain Keeping correct posture when sitting, standing or completing your activities will reduce the stress put on  different body tissues, allowing injured tissues a chance to heal and limiting painful experiences. The following are general guidelines for improved posture.  While reading these guidelines, remember: The exercises prescribed by your provider will help you have the flexibility and strength to maintain correct postures. The correct posture provides the best environment for your joints to work. All of your joints have less wear and tear when properly supported by a spine with good posture. This means you will experience a healthier, less painful body. Correct posture must be practiced with all of your activities, especially prolonged   sitting and standing. Correct posture is as important when doing repetitive low-stress activities (typing) as it is when doing a single heavy-load activity (lifting).  RESTING POSITIONS Consider which positions are most painful for you when choosing a resting position. If you have pain with flexion-based activities (sitting, bending, stooping, squatting), choose a position that allows you to rest in a less flexed posture. You would want to avoid curling into a fetal position on your side. If your pain worsens with extension-based activities (prolonged standing, working overhead), avoid resting in an extended position such as sleeping on your stomach. Most people will find more comfort when they rest with their spine in a more neutral position, neither too rounded nor too arched. Lying on a non-sagging bed on your side with a pillow between your knees, or on your back with a pillow under your knees will often provide some relief. Keep in mind, being in any one position for a prolonged period of time, no matter how correct your posture, can still lead to stiffness.  PROPER SITTING POSTURE In order to minimize stress and discomfort on your spine, you must sit with correct posture. Sitting with good posture should be effortless for a healthy body. Returning to good posture is a  gradual process. Many people can work toward this most comfortably by using various supports until they have the flexibility and strength to maintain this posture on their own. When sitting with proper posture, your ears will fall over your shoulders and your shoulders will fall over your hips. You should use the back of the chair to support your upper back. Your lower back will be in a neutral position, just slightly arched. You may place a small pillow or folded towel at the base of your lower back for  support.  When working at a desk, create an environment that supports good, upright posture. Without extra support, muscles tire, which leads to excessive strain on joints and other tissues. Keep these recommendations in mind:  CHAIR: A chair should be able to slide under your desk when your back makes contact with the back of the chair. This allows you to work closely. The chair's height should allow your eyes to be level with the upper part of your monitor and your hands to be slightly lower than your elbows.  BODY POSITION Your feet should make contact with the floor. If this is not possible, use a foot rest. Keep your ears over your shoulders. This will reduce stress on your neck and low back.  INCORRECT SITTING POSTURES  If you are feeling tired and unable to assume a healthy sitting posture, do not slouch or slump. This puts excessive strain on your back tissues, causing more damage and pain. Healthier options include: Using more support, like a lumbar pillow. Switching tasks to something that requires you to be upright or walking. Talking a brief walk. Lying down to rest in a neutral-spine position.  PROLONGED STANDING WHILE SLIGHTLY LEANING FORWARD  When completing a task that requires you to lean forward while standing in one place for a long time, place either foot up on a stationary 2-4 inch high object to help maintain the best posture. When both feet are on the ground, the lower  back tends to lose its slight inward curve. If this curve flattens (or becomes too large), then the back and your other joints will experience too much stress, tire more quickly, and can cause pain.  CORRECT STANDING POSTURES Proper standing posture should   be assumed with all daily activities, even if they only take a few moments, like when brushing your teeth. As in sitting, your ears should fall over your shoulders and your shoulders should fall over your hips. You should keep a slight tension in your abdominal muscles to brace your spine. Your tailbone should point down to the ground, not behind your body, resulting in an over-extended swayback posture.   INCORRECT STANDING POSTURES  Common incorrect standing postures include a forward head, locked knees and/or an excessive swayback. WALKING Walk with an upright posture. Your ears, shoulders and hips should all line-up.  PROLONGED ACTIVITY IN A FLEXED POSITION When completing a task that requires you to bend forward at your waist or lean over a low surface, try to find a way to stabilize 3 out of 4 of your limbs. You can place a hand or elbow on your thigh or rest a knee on the surface you are reaching across. This will provide you more stability, so that your muscles do not tire as quickly. By keeping your knees relaxed, or slightly bent, you will also reduce stress across your lower back. CORRECT LIFTING TECHNIQUES  DO : Assume a wide stance. This will provide you more stability and the opportunity to get as close as possible to the object which you are lifting. Tense your abdominals to brace your spine. Bend at the knees and hips. Keeping your back locked in a neutral-spine position, lift using your leg muscles. Lift with your legs, keeping your back straight. Test the weight of unknown objects before attempting to lift them. Try to keep your elbows locked down at your sides in order get the best strength from your shoulders when carrying an  object.   Always ask for help when lifting heavy or awkward objects. INCORRECT LIFTING TECHNIQUES DO NOT:  Lock your knees when lifting, even if it is a small object. Bend and twist. Pivot at your feet or move your feet when needing to change directions. Assume that you can safely pick up even a paperclip without proper posture.  

## 2021-03-23 ENCOUNTER — Ambulatory Visit (INDEPENDENT_AMBULATORY_CARE_PROVIDER_SITE_OTHER): Payer: PPO | Admitting: Pharmacist

## 2021-03-23 DIAGNOSIS — E119 Type 2 diabetes mellitus without complications: Secondary | ICD-10-CM

## 2021-03-23 DIAGNOSIS — I10 Essential (primary) hypertension: Secondary | ICD-10-CM

## 2021-03-23 DIAGNOSIS — E7800 Pure hypercholesterolemia, unspecified: Secondary | ICD-10-CM

## 2021-03-23 DIAGNOSIS — R202 Paresthesia of skin: Secondary | ICD-10-CM

## 2021-03-23 NOTE — Patient Instructions (Signed)
Visit Information  PATIENT GOALS:  Goals Addressed             This Visit's Progress    Chronic Care Management Pharmacy Care Plan   On track    CARE PLAN ENTRY (see longitudinal plan of care for additional care plan information)  Current Barriers:  Chronic Disease Management support, education, and care coordination needs related to Hypertension, Hyperlipidemia, Pre-Diabetes, GERD, Chronic Back Pain, BPH   Hypertension BP Readings from Last 3 Encounters:  03/12/21 118/68  01/18/21 122/68  08/12/20 122/70  Pharmacist Clinical Goal(s): Over the next 90 days, patient will work with PharmD and providers to achieve BP goal <140/90 Current regimen:  Losartan 100mg  daily Interventions: Discussed blood pressure  goal  Patient self care activities - Over the next 90 days, patient will: Check blood pressure 2-3 times per week, document, and provide at future appointments Ensure daily salt intake < 2300 mg/day  Hyperlipidemia Lab Results  Component Value Date/Time   LDLDIRECT 74.0 07/21/2020 01:29 PM  Last triglycerides were 224 (07/21/2020)   Pharmacist Clinical Goal(s): Over the next 90 days, patient will work with PharmD and providers to maintain LDL goal < 100 and attain triglycerides <150 Current regimen:  Atorvastatin 10mg  daily at bedtime Interventions: Discussed LDL and triglycerides goal  Patient self care activities - Over the next 90 days, patient will: Maintain cholesterol medication regimen.  Limit intake of sweets Great job not drinking sodas!!  Pre-Diabetes Lab Results  Component Value Date/Time   HGBA1C 6.4 01/18/2021 03:22 PM   HGBA1C 6.6 (H) 07/21/2020 01:29 PM  Pharmacist Clinical Goal(s): Over the next 90 days, patient will work with PharmD and providers to maintain A1c goal <6.5% Current regimen:  Diet and exercise management   Interventions: Discussed A1c goal Discussed diet and exercise Patient self care activities - Over the next 90 days,  patient will: Maintain A1c less than 6.5% Continue to limit intake of sugar.   Chronic Back Pain:  Current Regimen:  Morphine ER 30mg  twice a day Duloxetine 20mg  daily (started 03/12/2021) Interventions: Recommended continue current therapy and follow up with pain clinic Recommended take duloxetine with food; Contact office if stomach upset does not improve or is bothersome   Health Maintenance:  Reviewed vaccination history  Patient reports he received COVID boost and annual flu vaccine 02/2021 at [facility] Patient was noted to have elevated TSH 03/12/2021. Plan was to add T4 but does not look like it was added.  Intervention:  Discussed getting Shingrix vaccine series (has not gotten yet due to cost) will check with pharmacy about cost in 2023.  Messaged PCP about rechecking TSH / thyroid panel  Medication management Pharmacist Clinical Goal(s): Over the next 90 days, patient will work with PharmD and providers to maintain optimal medication adherence Current [facility] Interventions Comprehensive medication review performed. Continue current medication management strategy Patient self care activities - Over the next 90 days, patient will: Focus on medication adherence by filling and taking medications appropriately  Take medications as prescribed Report any questions or concerns to PharmD and/or provider(s)  Please see past updates related to this goal by clicking on the "Past Updates" button in the selected goal          The patient verbalized understanding of instructions, educational materials, and care plan provided today and declined offer to receive copy of patient instructions, educational materials, and care plan.   Telephone follow up appointment with care management team member scheduled for: 3 months  Bradley Navarro  Bradley Navarro, PharmD Clinical Pharmacist [place]

## 2021-03-23 NOTE — Chronic Care Management (AMB) (Signed)
Chronic Care Management Pharmacy Note  03/23/2021 Name:  Bradley Navarro MRN:  [id number] DOB:  01-23-1941  Summary: Noted TSH was elevated 03/12/2021 - plan was to add T4 but does not look like this was completed.   Recommendations/Changes made from today's visit: Check with PCP about rechecking TSH / thyroid panel  Subjective: Bradley Navarro is an 80 y.o. year old male who is a primary patient of Navarro, Bradley Berthold, MD.  The CCM team was consulted for assistance with disease management and care coordination needs.    Engaged with patient by telephone for follow up visit in response to provider referral for pharmacy case management and/or care coordination services.   Consent to Services:  The patient was given information about Chronic Care Management services, agreed to services, and gave verbal consent prior to initiation of services.  Please see initial visit note for detailed documentation.   Patient Care Team: Colon Branch, MD as PCP - General Czinsky, Bradley Navarro, [place] as Physician Assistant (Orthopedic Surgery) Bradley Kief, MD as Consulting Physician (Rehabilitation) Bradley Mallow, MD as Consulting Physician (Urology) [place], Bradley Bravo, MD as Referring Physician (Dermatology) Bradley Navarro, PharmD (Pharmacist)  Recent office visits: 03/12/2021 - PCP (Dr Bradley Navarro) Paresthesias in feet. Started duloxetine 29m daily. Also noted TSH was elevated - T4 added and vitamin D low at 28 - added OTC vitamin D3 1000 IU daily. 01/18/2021 - PCP (Dr Bradley Navarro/U chronic conditions. No medication changes noted.   Recent consult visits: 10/20/2020 -   Podiatry (Dr PBarkley Bruns pain in left toe; ingrowing nail  Hospital visits: None in previous 6 months  Objective:  Lab Results  Component Value Date   CREATININE 0.95 01/18/2021   CREATININE 0.94 07/21/2020   CREATININE 0.81 12/31/2019    Lab Results  Component Value Date   HGBA1C 6.4 01/18/2021   Last diabetic Eye exam:  No results found for: HMDIABEYEEXA  Last diabetic Foot exam: No results found for: HMDIABFOOTEX      Component Value Date/Time   CHOL 131 07/21/2020 1329   TRIG 224.0 (H) 07/21/2020 1329   HDL 31.80 (L) 07/21/2020 1329   CHOLHDL 4 07/21/2020 1329   VLDL 44.8 (H) 07/21/2020 1329   LDLDIRECT 74.0 07/21/2020 1329    Hepatic Function Latest Ref Rng & Units 07/21/2020 05/02/2019 07/16/2018  Total Protein 6.0 - 8.3 g/dL 6.7 - -  Albumin 3.5 - 5.2 g/dL 4.3 - -  AST 0 - 37 U/L 13 19 15   ALT 0 - 53 U/L 12 18 14   Alk Phosphatase 39 - 117 U/L 79 - -  Total Bilirubin 0.2 - 1.2 mg/dL 0.8 - -    Lab Results  Component Value Date/Time   TSH 5.06 (H) 03/12/2021 02:40 PM   TSH 3.17 05/02/2019 09:12 AM    CBC Latest Ref Rng & Units 01/18/2021 12/31/2019 03/21/2019  WBC 4.0 - 10.5 K/uL 5.9 5.7 14.4(H)  Hemoglobin 13.0 - 17.0 g/dL 13.1 13.9 13.3  Hematocrit 39.0 - 52.0 % 37.8(L) 39.8 39.7  Platelets 150.0 - 400.0 K/uL 225.0 201.0 263.0    Lab Results  Component Value Date/Time   VD25OH 28 (L) 03/12/2021 02:40 PM    Clinical ASCVD: No  The 10-year ASCVD risk score (Arnett DK, et al., 2019) is: 52.7%   Values used to calculate the score:     Age: [age]years     Sex: Male     Is Non-Hispanic African American: No  Diabetic: Yes     Tobacco smoker: No     Systolic Blood Pressure: 502 mmHg     Is BP treated: Yes     HDL Cholesterol: 31.8 mg/dL     Total Cholesterol: 131 mg/dL    Social History   Tobacco Use  Smoking Status Former  Smokeless Tobacco Never  Tobacco Comments   quit in the 90, light smoker    BP Readings from Last 3 Encounters:  03/12/21 118/68  01/18/21 122/68  08/12/20 122/70   Pulse Readings from Last 3 Encounters:  03/12/21 92  01/18/21 68  08/12/20 87   Wt Readings from Last 3 Encounters:  03/12/21 215 lb (97.5 kg)  01/18/21 218 lb 2 oz (98.9 kg)  11/23/20 216 lb (98 kg)    Assessment: Review of patient past medical history, allergies, medications,  health status, including review of consultants reports, laboratory and other test data, was performed as part of comprehensive evaluation and provision of chronic care management services.   SDOH:  (Social Determinants of Health) assessments and interventions performed:  SDOH Interventions    Flowsheet Row Most Recent Value  SDOH Interventions   Financial Strain Interventions Intervention Not Indicated  Physical Activity Interventions Patient Refused  [cares for wife / does housework / has chronic back pain]       CCM Care Plan  Allergies  Allergen Reactions   Penicillins Swelling    redness diffusely 1962    Medications Reviewed Today     Reviewed by Bradley Navarro, PharmD (Pharmacist) on 03/23/21 at 67  Med List Status: <None>   Medication Order Taking? Sig Documenting Provider Last Dose Status Informant  atorvastatin (LIPITOR) 10 MG tablet [id number] Yes Take 1 tablet (10 mg total) by mouth at bedtime. Colon Branch, MD Taking Active   DULoxetine (CYMBALTA) 20 MG capsule [id number] Yes Take 1 capsule (20 mg total) by mouth daily. Bradley Pal, DO Taking Active   losartan (COZAAR) 100 MG tablet [id number] Yes Take 1 tablet (100 mg total) by mouth daily. Colon Branch, MD Taking Active   morphine (MS CONTIN) 30 MG 12 hr tablet [id number] Yes Take 30 mg by mouth in the morning and at bedtime.  [provider] Taking Active            Med Note Bradley Navarro, Bradley Navarro Mar 16, 2017 10:26 AM)    Multiple Vitamin (MULITIVITAMIN WITH MINERALS) TABS [id number] Yes Take 1 tablet by mouth daily. [provider] Taking Active Multiple Informants  omeprazole (PRILOSEC) 20 MG capsule [id number] Yes Take 1 capsule (20 mg total) by mouth 2 (two) times daily.  Patient taking differently: Take 20 mg by mouth daily.   Bradley Furry, MD Taking Active            Med Note [facility], [place] Mar 23, 2021 10:52 AM)    tamsulosin (FLOMAX) 0.4 MG CAPS capsule [id number] Yes Take 0.4  mg by mouth daily. [provider] Taking Active   Vitamin D, Cholecalciferol, 25 MCG (1000 UT) CAPS [id number] Yes Take 1,000 Units by mouth daily. [provider] Taking Active             Patient Active Problem List   Diagnosis Date Noted   SCC (squamous cell carcinoma) 02/28/2020   High cholesterol 10/30/2018   Diabetes ([place]) 10/30/2018   Hypertension 04/24/2017   Annual physical exam 04/17/2015   PCP NOTES >>>> 04/17/2015   Chronic back pain  08/17/2010   HEADACHE 02/09/2010    Immunization History  Administered Date(s) Administered   Fluad Quad(high Dose 65+) 05/02/2019, 03/26/2020   Influenza Split 03/30/2011, 03/28/2012   Influenza Whole 03/02/2010   Influenza, High Dose Seasonal PF 03/17/2015, 04/19/2016, 03/16/2017, 03/26/2018   Influenza,inj,Quad PF,6+ Mos 03/20/2013, 03/25/2014   PFIZER(Purple Top)SARS-COV-2 Vaccination 08/02/2019, 08/24/2019, 03/20/2020, 10/26/2020   Pneumococcal Conjugate-13 04/17/2015   Pneumococcal Polysaccharide-23 03/02/2010, 07/21/2020   Td 04/17/2015    Conditions to be addressed/monitored: HTN, HLD, and BPH, chronic back pain, elevated TSH; low vitamin D; type 2 DM; GERD  Care Plan : General Pharmacy (Adult)  Updates made by Bradley Navarro, PHARMD since 03/23/2021 12:00 AM     Problem:  Chronic Disease Management support, education, and care coordination needs related to Hypertension, Hyperlipidemia, Pre-Diabetes, GERD, Chronic Back Pain, BPH   Priority: Medium  Onset Date: 09/28/2020  Note:   Current Barriers:  Unable to maintain control of HTN  Pharmacist Clinical Goal(s):  Over the next 180 days, patient will achieve control of HTN as evidenced by BP <140/90  through collaboration with PharmD and provider.   Interventions: 1:1 collaboration with Colon Branch, MD regarding development and update of comprehensive plan of care as evidenced by provider attestation and co-signature Inter-disciplinary care team  collaboration (see longitudinal plan of care) Comprehensive medication review performed; medication list updated in electronic medical record  Pre- Diabetes / Diabetes: Controlled. Last A1c improved from 6.6%  to 6.4% Current treatment: no pharmacologic therapy; Diet only Current glucose readings: does not check at home.  Denies hypoglycemic/hyperglycemic symptoms Current meal patterns: over last 6 months has stopped drinking sodas and limits intake of sweets Current exercise: none (patient declined intervention due to he feels he is active enough caring for wife and doing housework) Interventions: Reviewed serving sizes and high CHO foods to limit If A1c >6.5% at next check, consider adding metformin ER 573m daily   Hypertension: At goal; blood pressure goal <140/90   BP Readings from Last 3 Encounters:  03/12/21 118/68  01/18/21 122/68  08/12/20 122/70   Current treatment: losartan 1068mdaily  Home blood pressure usually 130 to 140 / 70's Interventions:  Recommended continue current therapy record BP readings for review at future appts.  Continue to limit intake of sodium to <2300 mg daily   Hyperlipidemia, mixed: LDL at goal but Tg elevated LDL goal <100 and Tg goal <150 Current treatment: Atorvastatin 1080mt bedtime  Intervention:  Recommended continue current therapy Discussed limiting intake of sugar and carbohydrates to lower triglycerides  GERD:  Patient is currently controlled on the following medications:  Omeprazole 23m1mice daily Continue current medications  Chronic Back Pain:  Controlled; patient states duloxetine addition has helped with pain though he has had a little upset stomach Current Regimen:  Morphine ER 30mg86mce a day Duloxetine 23mg 50my (started 03/12/2021) Interventions: Recommended continue current therapy and follow up with pain clinic Recommended take duloxetine with food; Contact office if stomach upset does not improve or is  bothersome    BPH:  Followed by Dr Bell  Gloriann Loanent is currently controlled on the following medications:  Tamsulosin 0.4mg da17m Overnight urination: once or less; Denies urinary hesitancy Recommend continue to follow up with urology   Health Maintenance:  Reviewed vaccination history  Patient reports he received COVID boost and annual flu vaccine [facility] Patient was noted to have elevated TSH 03/12/2021. Plan was to add T4 but does not look like it was added.  Intervention:  Discussed getting Shingrix vaccine series (has not gotten yet due to cost) will check with pharmacy about cost in 2023.  Messaged PCP about rechecking TSH / thyroid panel        Medication Assistance: None required.  Patient affirms current coverage meets needs.  [facility]  [id number] [place] [id number]: [phone]: [phone]  Follow Up:  Patient agrees to Care Plan and Follow-up.  Plan: Telephone follow up appointment with care management team member scheduled for:  3 months  TCherre Navarro PharmD Clinical Pharmacist [facility]

## 2021-04-03 ENCOUNTER — Other Ambulatory Visit: Payer: Self-pay | Admitting: Family Medicine

## 2021-04-03 DIAGNOSIS — R202 Paresthesia of skin: Secondary | ICD-10-CM

## 2021-04-16 DIAGNOSIS — M5431 Sciatica, right side: Secondary | ICD-10-CM | POA: Diagnosis not present

## 2021-04-16 DIAGNOSIS — M4807 Spinal stenosis, lumbosacral region: Secondary | ICD-10-CM | POA: Diagnosis not present

## 2021-04-19 DIAGNOSIS — E7800 Pure hypercholesterolemia, unspecified: Secondary | ICD-10-CM | POA: Diagnosis not present

## 2021-04-19 DIAGNOSIS — I10 Essential (primary) hypertension: Secondary | ICD-10-CM | POA: Diagnosis not present

## 2021-04-19 DIAGNOSIS — E119 Type 2 diabetes mellitus without complications: Secondary | ICD-10-CM

## 2021-04-29 ENCOUNTER — Other Ambulatory Visit: Payer: Self-pay | Admitting: Internal Medicine

## 2021-06-25 ENCOUNTER — Ambulatory Visit (INDEPENDENT_AMBULATORY_CARE_PROVIDER_SITE_OTHER): Payer: PPO | Admitting: Pharmacist

## 2021-06-25 DIAGNOSIS — E119 Type 2 diabetes mellitus without complications: Secondary | ICD-10-CM

## 2021-06-25 DIAGNOSIS — I10 Essential (primary) hypertension: Secondary | ICD-10-CM

## 2021-06-25 DIAGNOSIS — E7800 Pure hypercholesterolemia, unspecified: Secondary | ICD-10-CM

## 2021-06-25 NOTE — Chronic Care Management (AMB) (Signed)
Chronic Care Management Pharmacy Note  06/25/2021 Name:  Bradley Navarro MRN:  [id number] DOB:  06/24/1940  Summary: Noted TSH was elevated 03/12/2021 - plan was to add T4 but does not look like this was completed. Discussed previously with PCP - will check at upcoming appointment 07/21/2021. Also consider checking lipids and vitamin D since 02/2021 vitamin D low at 28.    Reviewed vaccination history; Due to have Shingrix series but has not gotten past due to cost. Discussed getting Shingrix vaccine series. Patient plans to get in 2023 since Medicare is covering now. [facility]   Subjective: Bradley Navarro is an 81 y.o. year old male who is a primary patient of Paz, Alda Berthold, MD.  The CCM team was consulted for assistance with disease management and care coordination needs.    Engaged with patient by telephone for follow up visit in response to provider referral for pharmacy case management and/or care coordination services.   Consent to Services:  The patient was given information about Chronic Care Management services, agreed to services, and gave verbal consent prior to initiation of services.  Please see initial visit note for detailed documentation.   Patient Care Team: Colon Branch, MD as PCP - General Czinsky, Stephani Police, [place] as Physician Assistant (Orthopedic Surgery) Zonia Kief, MD as Consulting Physician (Rehabilitation) Lucas Mallow, MD as Consulting Physician (Urology) Renda Rolls, Jennefer Bravo, MD as Referring Physician (Dermatology) Cherre Robins, RPH-CPP (Pharmacist)  Recent office visits: 03/12/2021 - PCP (Bradley Bradley Navarro) Paresthesias in feet. Started duloxetine 25m daily. Also noted TSH was elevated - T4 added and vitamin D low at 28 - added OTC vitamin D3 1000 IU daily. 01/18/2021 - PCP (Bradley Bradley Navarro. No medication changes noted.   Recent consult visits: 04/16/2021 - Physical Med and Rehab  (Bradley Navarro seen for sciatica and spinal stenosis 10/20/2020 -   Podiatry (Bradley Bradley Navarro pain in left toe; ingrowing nail  Hospital visits: None in previous 6 months  Objective:  Lab Results  Component Value Date   CREATININE 0.95 01/18/2021   CREATININE 0.94 07/21/2020   CREATININE 0.81 12/31/2019    Lab Results  Component Value Date   HGBA1C 6.4 01/18/2021   Last diabetic Eye exam: No results found for: HMDIABEYEEXA  Last diabetic Foot exam: No results found for: HMDIABFOOTEX      Component Value Date/Time   CHOL 131 07/21/2020 1329   TRIG 224.0 (H) 07/21/2020 1329   HDL 31.80 (L) 07/21/2020 1329   CHOLHDL 4 07/21/2020 1329   VLDL 44.8 (H) 07/21/2020 1329   LDLDIRECT 74.0 07/21/2020 1329    Hepatic Function Latest Ref Rng & Units 07/21/2020 05/02/2019 07/16/2018  Total Protein 6.0 - 8.3 g/dL 6.7 - -  Albumin 3.5 - 5.2 g/dL 4.3 - -  AST 0 - 37 U/L _0 ALT 0 - 53 U/L _1 Alk Phosphatase 39 - 117 U/L 79 - -  Total Bilirubin 0.2 - 1.2 mg/dL 0.8 - -    Lab Results  Component Value Date/Time   TSH 5.06 (H) 03/12/2021 02:40 PM   TSH 3.17 05/02/2019 09:12 AM    CBC Latest Ref Rng & Units 01/18/2021 12/31/2019 03/21/2019  WBC 4.0 - 10.5 K/uL 5.9 5.7 14.4(H)  Hemoglobin 13.0 - 17.0 g/dL 13.1 13.9 13.3  Hematocrit 39.0 - 52.0 % 37.8(L) 39.8 39.7  Platelets 150.0 - 400.0 K/uL 225.0 201.0 263.0  Lab Results  Component Value Date/Time   VD25OH 28 (L) 03/12/2021 02:40 PM    Clinical ASCVD: No  The 10-year ASCVD risk score (Arnett DK, et al., 2019) is: 52.7%   Values used to calculate the score:     Age: 81 years     Sex: Male     Is Non-Hispanic African American: No     Diabetic: Yes     Tobacco smoker: No     Systolic Blood Pressure: 482 mmHg     Is BP treated: Yes     HDL Cholesterol: 31.8 mg/dL     Total Cholesterol: 131 mg/dL    Social History   Tobacco Use  Smoking Status Former  Smokeless Tobacco Never  Tobacco Comments   quit in the 90,  light smoker    BP Readings from Last 3 Encounters:  03/12/21 118/68  01/18/21 122/68  08/12/20 122/70   Pulse Readings from Last 3 Encounters:  03/12/21 92  01/18/21 68  08/12/20 87   Wt Readings from Last 3 Encounters:  03/12/21 215 lb (97.5 kg)  01/18/21 218 lb 2 oz (98.9 kg)  11/23/20 216 lb (98 kg)    Assessment: Review of patient past medical history, allergies, medications, health status, including review of consultants reports, laboratory and other test data, was performed as part of comprehensive evaluation and provision of chronic care management services.   SDOH:  (Social Determinants of Health) assessments and interventions performed:  SDOH Interventions    Flowsheet Row Most Recent Value  SDOH Interventions   Financial Strain Interventions Intervention Not Indicated       CCM Care Plan  Allergies  Allergen Reactions   Penicillins Swelling    redness diffusely 1962    Medications Reviewed Today     Reviewed by Cherre Robins, RPH-CPP (Pharmacist) on 06/25/21 at 45  Med List Status: <None>   Medication Order Taking? Sig Documenting Provider Last Dose Status Informant  atorvastatin (LIPITOR) 10 MG tablet [id number] Yes TAKE 1 TABLET BY MOUTH EVERYDAY AT BEDTIME Colon Branch, MD Taking Active   DULoxetine (CYMBALTA) 20 MG capsule [id number] Yes TAKE 1 CAPSULE BY MOUTH EVERY DAY Shelda Pal, DO Taking Active   losartan (COZAAR) 100 MG tablet [id number] Yes Take 1 tablet (100 mg total) by mouth daily. Colon Branch, MD Taking Active   morphine (MS CONTIN) 30 MG 12 hr tablet [id number] Yes Take 30 mg by mouth in the morning and at bedtime.  [provider] Taking Active            Med Note [name], [place] Mar 16, 2017 10:26 AM)    Multiple Vitamin (MULITIVITAMIN WITH MINERALS) TABS [id number] Yes Take 1 tablet by mouth daily. [provider] Taking Active Multiple Informants  omeprazole (PRILOSEC) 20 MG capsule [id number] Yes Take 1  capsule (20 mg total) by mouth 2 (two) times daily.  Patient taking differently: Take 20 mg by mouth daily.   Tanna Furry, MD Taking Active            Med Note [facility], [place] Mar 23, 2021 10:52 AM)    tamsulosin (FLOMAX) 0.4 MG CAPS capsule [id number] Yes Take 0.4 mg by mouth daily. [provider] Taking Active   Vitamin D, Cholecalciferol, 25 MCG (1000 UT) CAPS [id number] Yes Take 1,000 Units by mouth daily. [provider] Taking Active             Patient Active  Problem List   Diagnosis Date Noted   SCC (squamous cell carcinoma) 02/28/2020   High cholesterol 10/30/2018   Diabetes ([place]) 10/30/2018   Hypertension 04/24/2017   Annual physical exam 04/17/2015   PCP NOTES >>>> 04/17/2015   Chronic back pain 08/17/2010   HEADACHE 02/09/2010    Immunization History  Administered Date(s) Administered   Fluad Quad(high Dose 65+) 05/02/2019, 03/26/2020   Influenza Split 03/30/2011, 03/28/2012   Influenza Whole 03/02/2010   Influenza, High Dose Seasonal PF 03/17/2015, 04/19/2016, 03/16/2017, 03/26/2018   Influenza,inj,Quad PF,6+ Mos 03/20/2013, 03/25/2014   Influenza-Unspecified 03/15/2021   PFIZER(Purple Top)SARS-COV-2 Vaccination 08/02/2019, 08/24/2019, 03/20/2020, 10/26/2020   Pfizer Covid-19 Vaccine Bivalent Booster 62yr & up 02/19/2021   Pneumococcal Conjugate-13 04/17/2015   Pneumococcal Polysaccharide-23 03/02/2010, 07/21/2020   Td 04/17/2015    Navarro to be addressed/monitored: HTN, HLD, and BPH, chronic back pain, elevated TSH; low vitamin D; type 2 DM; GERD  Care Plan : General Pharmacy (Adult)  Updates made by ECherre Robins RPH-CPP since 06/25/2021 12:00 AM     Problem:  Chronic Disease Management support, education, and care coordination needs related to Hypertension, Hyperlipidemia, Pre-Diabetes, GERD, Chronic Back Pain, BPH   Priority: Medium  Onset Date: 09/28/2020  Note:   Current Barriers:  Unable to maintain control of  HTN  Pharmacist Clinical Goal(s):  Over the next 180 days, patient will achieve control of HTN as evidenced by BP <140/90  through collaboration with PharmD and provider.   Interventions: 1:1 collaboration with PColon Branch MD regarding development and update of comprehensive plan of care as evidenced by provider attestation and co-signature Inter-disciplinary care team collaboration (see longitudinal plan of care) Comprehensive medication review performed; medication list updated in electronic medical record  Pre- Diabetes / Diabetes: Controlled. Last A1c improved from 6.6%  to 6.4% Current treatment: no pharmacologic therapy; Diet only Current glucose readings: does not check at home.  Denies hypoglycemic/hyperglycemic symptoms Current meal patterns: over last 9 months has stopped drinking sodas and limits intake of sweets Current exercise: none (patient declined intervention due to he feels he is active enough caring for wife and doing housework) Interventions: Reviewed serving sizes and high CHO foods to limit If A1c >6.5% at next check, consider adding metformin ER 5033mdaily   Hypertension: At goal; blood pressure goal <140/90   BP Readings from Last 3 Encounters:  03/12/21 118/68  01/18/21 122/68  08/12/20 122/70  Current treatment: losartan 10046maily (last filled 90 day 05/18/2021) Home blood pressure usually 130 to 140 / 70's Interventions:  Recommended continue current therapy record BP readings for review at future appts.  Continue to limit intake of sodium to <2300 mg daily   Hyperlipidemia, mixed: LDL at goal but Tg elevated LDL goal <100 and Tg goal <150 Current treatment: Atorvastatin 31m84m bedtime (last filled 90 days 04/29/2021) Intervention:  Recommended continue current therapy Discussed limiting intake of sugar and carbohydrates to lower triglycerides  GERD:  Patient is currently controlled on the following medications:  Omeprazole 20mg107mce  daily Continue current medications  Chronic Back and nerve Pain:  Controlled;  Current Regimen:  Morphine ER 30mg 41me a day Duloxetine 20mg d13m (started 03/12/2021) Patient reports pain in feet / nerves significantly improved since duloxetine started. Stomach issues resolved when he started taking duloxetine with food. Patient also reports Hospice has been assigned to his wife's care since 06/13/2021 Interventions: Recommended continue current therapy and follow up with pain clinic Continue to take duloxetine with food  BPH:  Followed by Bradley Gloriann Loan  Patient is currently controlled on the following medications:  Tamsulosin 0.105m daily Overnight urination: once or less; Denies urinary hesitancy Recommend continue to follow up with urology   Health Maintenance:  Reviewed vaccination history; Due to have Shingrix series but has not gotten past due to cost Patient was noted to have elevated TSH 03/12/2021. Plan was to add T4 but labs unable to add. Discussed with PCP - plan to recheck at upcoming appointment 07/21/2021 Intervention:  Discussed getting Shingrix vaccine series. Patient plans to get in 2023 since Medicare is covering now. [facility] Reminded to get thyroid function recheck at appointment 07/21/2021        Medication Assistance: None required.  Patient affirms current coverage meets needs.  [facility]  [id number] [place] [place]: [phone]: [phone] Follow Up:  Patient agrees to Care Plan and Follow-up.  Plan: Telephone follow up appointment with care management team member scheduled for:  3 to 4 months  TaCherre RobinsPharmD Clinical Pharmacist [facility]

## 2021-06-25 NOTE — Patient Instructions (Signed)
Bradley Navarro It was a pleasure speaking with you today.  I have attached a summary of our visit today and information about your health goals.   If you have any questions or concerns, please feel free to contact me either at the phone number below or with a MyChart message.   Keep up the good work!  Cherre Robins, PharmD Clinical Pharmacist [place] [phone] (direct line)  [phone] (main office number)   The patient verbalized understanding of instructions, educational materials, and care plan provided today and declined offer to receive copy of patient instructions, educational materials, and care plan.    CARE PLAN ENTRY (see longitudinal plan of care for additional care plan information)  Current Barriers:  Chronic Disease Management support, education, and care coordination needs related to Hypertension, Hyperlipidemia, Pre-Diabetes, GERD, Chronic Back Pain, BPH   Hypertension BP Readings from Last 3 Encounters:  03/12/21 118/68  01/18/21 122/68  08/12/20 122/70   Pharmacist Clinical Goal(s): Over the next 90 days, patient will work with PharmD and providers to achieve BP goal <140/90 Current regimen:  Losartan 100mg  daily Interventions: Discussed blood pressure  goal  Patient self care activities - Over the next 90 days, patient will: Check blood pressure 2-3 times per week, document, and provide at future appointments Ensure daily salt intake < 2300 mg/day  Hyperlipidemia Lab Results  Component Value Date/Time   LDLDIRECT 74.0 07/21/2020 01:29 PM  Last triglycerides were 224 (07/21/2020)   Pharmacist Clinical Goal(s): Over the next 90 days, patient will work with PharmD and providers to maintain LDL goal < 100 and attain triglycerides <150 Current regimen:  Atorvastatin 10mg  daily at bedtime Interventions: Discussed LDL and triglycerides goal  Recommended recheck lipids at upcoming appointment with Dr Larose Kells  07/21/2021 Patient self care activities - Over the next 90 days, patient will: Maintain cholesterol medication regimen.  Limit intake of sweets Great job not drinking sodas!!  Pre-Diabetes Lab Results  Component Value Date/Time   HGBA1C 6.4 01/18/2021 03:22 PM   HGBA1C 6.6 (H) 07/21/2020 01:29 PM   Pharmacist Clinical Goal(s): Over the next 90 days, patient will work with PharmD and providers to maintain A1c goal <6.5% Current regimen:  Diet and exercise management   Interventions: Discussed A1c goal Discussed diet and exercise Patient self care activities - Over the next 90 days, patient will: Maintain A1c less than 6.5% Continue to limit intake of sugar.   Chronic Back Pain:  Current Regimen:  Morphine ER 30mg  twice a day Duloxetine 20mg  daily (started 03/12/2021) Interventions: Recommended continue current therapy and follow up with pain clinic Continue to take duloxetine with food   Health Maintenance:  Reviewed vaccination history; Due to have Shingrix series but has not gotten in past due to cost Noted that TSH was elevated 03/12/2021. Plan was to add T4 but lab unable to add. Discussed with PCP - plan to recheck at upcoming appointment 07/21/2021 Intervention:  Discussed getting Shingrix vaccine series. Patient plans to get in 2023 since Medicare is covering now. [facility] [place] Reminded to get thyroid function recheck at appointment 07/21/2021  Medication management Pharmacist Clinical Goal(s): Over the next 90 days, patient will work with PharmD and providers to maintain optimal medication adherence [facility] Interventions Comprehensive medication review performed. Continue current medication management strategy Patient self care activities - Over the next 90 days, patient will: Focus on medication adherence by filling and taking medications appropriately  Take medications as prescribed Report any questions  or concerns to PharmD and/or provider(s)

## 2021-07-02 DIAGNOSIS — M4807 Spinal stenosis, lumbosacral region: Secondary | ICD-10-CM | POA: Diagnosis not present

## 2021-07-02 DIAGNOSIS — M5431 Sciatica, right side: Secondary | ICD-10-CM | POA: Diagnosis not present

## 2021-07-20 DIAGNOSIS — E7800 Pure hypercholesterolemia, unspecified: Secondary | ICD-10-CM

## 2021-07-20 DIAGNOSIS — E119 Type 2 diabetes mellitus without complications: Secondary | ICD-10-CM

## 2021-07-20 DIAGNOSIS — I10 Essential (primary) hypertension: Secondary | ICD-10-CM

## 2021-07-21 ENCOUNTER — Ambulatory Visit: Payer: PPO | Admitting: Internal Medicine

## 2021-07-27 ENCOUNTER — Encounter: Payer: Self-pay | Admitting: Internal Medicine

## 2021-07-27 ENCOUNTER — Ambulatory Visit (INDEPENDENT_AMBULATORY_CARE_PROVIDER_SITE_OTHER): Payer: PPO | Admitting: Internal Medicine

## 2021-07-27 VITALS — BP 144/84 | HR 81 | Temp 98.2°F | Resp 18 | Ht 70.0 in | Wt 215.1 lb

## 2021-07-27 DIAGNOSIS — Z0001 Encounter for general adult medical examination with abnormal findings: Secondary | ICD-10-CM | POA: Diagnosis not present

## 2021-07-27 DIAGNOSIS — E038 Other specified hypothyroidism: Secondary | ICD-10-CM

## 2021-07-27 DIAGNOSIS — I10 Essential (primary) hypertension: Secondary | ICD-10-CM | POA: Diagnosis not present

## 2021-07-27 DIAGNOSIS — E7800 Pure hypercholesterolemia, unspecified: Secondary | ICD-10-CM | POA: Diagnosis not present

## 2021-07-27 DIAGNOSIS — Z0000 Encounter for general adult medical examination without abnormal findings: Secondary | ICD-10-CM | POA: Diagnosis not present

## 2021-07-27 DIAGNOSIS — E559 Vitamin D deficiency, unspecified: Secondary | ICD-10-CM | POA: Diagnosis not present

## 2021-07-27 DIAGNOSIS — E1169 Type 2 diabetes mellitus with other specified complication: Secondary | ICD-10-CM | POA: Diagnosis not present

## 2021-07-27 NOTE — Patient Instructions (Addendum)
Per our records you are due for your diabetic eye exam. Please contact your eye doctor to schedule an appointment. Please have them send copies of your office visit notes to [place]. Our fax number is [phone]. If you need a referral to an eye doctor please let us know.  Recommend to bring a copy of your healthcare power of attorney   Check the  blood pressure regularly BP GOAL is between 110/65 and  135/85. If it is consistently higher or lower, let me know  Continue vitamin D supplements  GO TO THE LAB : Get the blood work     [place], [place] back for a checkup in 6 months   Neuropathy, diabetes and foot Care Foot care is an important part of your health, especially when you have diabetes. Diabetes may cause you to have problems because of poor blood flow (circulation) to your feet and legs, which can cause your skin to: Become thinner and drier. Break more easily. Heal more slowly. Peel and crack. You may also have nerve damage (neuropathy) in your legs and feet, causing decreased feeling in them. This means that you may not notice minor injuries to your feet that could lead to more serious problems. Noticing and addressing any potential problems early is the best way to prevent future foot problems. How to care for your feet Foot hygiene  Wash your feet daily with warm water and mild soap. Do not use hot water. Then, pat your feet and the areas between your toes until they are completely dry. Do not soak your feet as this can dry your skin. Trim your toenails straight across. Do not dig under them or around the cuticle. File the edges of your nails with an emery board or nail file. Apply a moisturizing lotion or petroleum jelly to the skin on your feet and to dry, brittle toenails. Use lotion that does not contain alcohol and is unscented. Do not apply lotion between your toes. Shoes and socks Wear clean socks or stockings every day.  Make sure they are not too tight. Do not wear knee-high stockings since they may decrease blood flow to your legs. Wear shoes that fit properly and have enough cushioning. Always look in your shoes before you put them on to be sure there are no objects inside. To break in new shoes, wear them for just a few hours a day. This prevents injuries on your feet. Wounds, scrapes, corns, and calluses  Check your feet daily for blisters, cuts, bruises, sores, and redness. If you cannot see the bottom of your feet, use a mirror or ask someone for help. Do not cut corns or calluses or try to remove them with medicine. If you find a minor scrape, cut, or break in the skin on your feet, keep it and the skin around it clean and dry. You may clean these areas with mild soap and water. Do not clean the area with peroxide, alcohol, or iodine. If you have a wound, scrape, corn, or callus on your foot, look at it several times a day to make sure it is healing and not infected. Check for: Redness, swelling, or pain. Fluid or blood. Warmth. Pus or a bad smell. General tips Do not cross your legs. This may decrease blood flow to your feet. Do not use heating pads or hot water bottles on your feet. They may burn your skin. If you have lost feeling in your  feet or legs, you may not know this is happening until it is too late. Protect your feet from hot and cold by wearing shoes, such as at the beach or on hot pavement. Schedule a complete foot exam at least once a year (annually) or more often if you have foot problems. Report any cuts, sores, or bruises to your health care provider immediately. Where to find more information American Diabetes Association: www.diabetes.org Association of Diabetes Care & Education Specialists: www.diabeteseducator.org Contact a health care provider if: You have a medical condition that increases your risk of infection and you have any cuts, sores, or bruises on your feet. You have an  injury that is not healing. You have redness on your legs or feet. You feel burning or tingling in your legs or feet. You have pain or cramps in your legs and feet. Your legs or feet are numb. Your feet always feel cold. You have pain around any toenails. Get help right away if: You have a wound, scrape, corn, or callus on your foot and: You have pain, swelling, or redness that gets worse. You have fluid or blood coming from the wound, scrape, corn, or callus. Your wound, scrape, corn, or callus feels warm to the touch. You have pus or a bad smell coming from the wound, scrape, corn, or callus. You have a fever. You have a red line going up your leg. Summary Check your feet every day for blisters, cuts, bruises, sores, and redness. Apply a moisturizing lotion or petroleum jelly to the skin on your feet and to dry, brittle toenails. Wear shoes that fit properly and have enough cushioning. If you have foot problems, report any cuts, sores, or bruises to your health care provider immediately. Schedule a complete foot exam at least once a year (annually) or more often if you have foot problems. This information is not intended to replace advice given to you by your health care provider. Make sure you discuss any questions you have with your health care provider. Document Revised: 12/26/2019 Document Reviewed: 12/26/2019 Elsevier Patient Education  [place].

## 2021-07-27 NOTE — Progress Notes (Signed)
Subjective:    Patient ID: Bradley Navarro, male    DOB: [birth year], 81 y.o.   MRN: [id number]  DOS:  07/27/2021 Type of visit - description: CPX  Since the last office visit is doing okay. It is however under some stress, his wife is in home-hospice. Recently saw Dr. Nani Ravens, had paresthesias.  Admits to numbness at the distal feet. He denies chest pain, difficulty breathing.  No other concerns.  Review of Systems  Other than above, a 14 point review of systems is negative      Past Medical History:  Diagnosis Date   Chronic back pain    Elevated BP without diagnosis of hypertension 04/24/2017   History of headache    frontal lobe cluster headaches   History of kidney stones    Post laminectomy syndrome    SCC (squamous cell carcinoma) 01/2020   Sciatica of right side 2016   S/P L5-S1 revision lam/PSF on 04-10-12 w/ relief of RLE pain post op, MRI 01/2014 shows minimal foraminal stenosis at L4-5 w/o recurrent stenosis at L5-S1    Past Surgical History:  Procedure Laterality Date   [place]  2012   L-5, Dr Vennie Homans   LUMBAR FUSION  2015   rod, plates   r arm surger     Social History   Socioeconomic History   Marital status: Married    Spouse name: Not on file   Number of children: 2   Years of education: Not on file   Highest education level: Not on file  Occupational History   Occupation: retired  Tobacco Use   Smoking status: Former   Smokeless tobacco: Never   Tobacco comments:    quit in the 90, light smoker   Substance and Sexual Activity   Alcohol use: No    Alcohol/week: 0.0 standard drinks   Drug use: No   Sexual activity: Not on file  Other Topics Concern   Not on file  Social History Narrative   Household: pt, wife. Wife is with hospice at home      children 2 and 2 step daughters, has g-kids   Social Determinants of Radio broadcast assistant Strain: Low Risk    Difficulty of Paying Living Expenses: Not  hard at all  Food Insecurity: No Food Insecurity   Worried About Charity fundraiser in the Last Year: Never true   Arboriculturist in the Last Year: Never true  Transportation Needs: No Transportation Needs   Lack of Transportation (Medical): No   Lack of Transportation (Non-Medical): No  Physical Activity: Inactive   Days of Exercise per Week: 0 days   Minutes of Exercise per Session: 0 min  Stress: No Stress Concern Present   Feeling of Stress : Only a little  Social Connections: Socially Isolated   Frequency of Communication with Friends and Family: Once a week   Frequency of Social Gatherings with Friends and Family: Once a week   Attends Religious Services: Never   Marine scientist or Organizations: No   Attends Music therapist: Never   Marital Status: Married  Human resources officer Violence: Not At Risk   Fear of Current or Ex-Partner: No   Emotionally Abused: No   Physically Abused: No   Sexually Abused: No    Current Outpatient Medications  Medication Instructions   atorvastatin (LIPITOR) 10 MG tablet TAKE 1 TABLET BY MOUTH EVERYDAY AT BEDTIME  DULoxetine (CYMBALTA) 20 MG capsule TAKE 1 CAPSULE BY MOUTH EVERY DAY   losartan (COZAAR) 100 mg, Oral, Daily   morphine (MS CONTIN) 30 mg, Oral, 2 times daily   Multiple Vitamin (MULITIVITAMIN WITH MINERALS) TABS 1 tablet, Daily   omeprazole (PRILOSEC) 20 mg, Oral, 2 times daily   tamsulosin (FLOMAX) 0.4 mg, Oral, Daily   Vitamin D (Cholecalciferol) 1,000 Units, Oral, Daily       Objective:   Physical Exam BP (!) 144/84 (BP Location: Left Arm, Patient Position: Sitting, Cuff Size: Small)    Pulse 81    Temp 98.2 F (36.8 C) (Oral)    Resp 18    Ht 5\' 10"  (1.778 m)    Wt 215 lb 2 oz (97.6 kg)    SpO2 97%    BMI 30.87 kg/m  General: Well developed, NAD, BMI noted Neck: No  thyromegaly  HEENT:  Normocephalic . Face symmetric, atraumatic Lungs:  CTA B Normal respiratory effort, no intercostal  retractions, no accessory muscle use. Heart: RRR,  no murmur.  Abdomen:  Not distended, soft, non-tender. No rebound or rigidity.   DM foot exam: No edema, good pedal pulses, pinprick examination: Decreased sensitivity distally in both feet Skin: Exposed areas without rash. Not pale. Not jaundice Neurologic:  alert & oriented X3.  Speech normal, gait appropriate for age and unassisted Strength symmetric and appropriate for age.  Psych: Cognition and judgment appear intact.  Cooperative with normal attention span and concentration.  Behavior appropriate. No anxious or depressed appearing.     Assessment     Assessment DM (A1c increased to 6.9 on 07-2020) HTN High cholesterol h/o HA-- s/p extensive w/u in the past. Has seen Dr Maureen Chatters and neuro @ [facility]   h/o kidney stones , multiple Chronic back pain-- sees pain management, see surgeries Chronic hoarseness: Never seen by ENT Sees dermatology SCC Abnormal EKG: TWI, RBBB, see note from 05-2018 BPH, LUTS: Saw urology, last visit 2020.   PLAN Here for CPX: DM: Diet controlled.  Has developed neuropathy.  Check A1c Neuropathy: Saw Dr. Nani Ravens few months ago with paresthesias, TSH was slightly elevated, vitamin D low.  He is currently taking OTC vitamin D.  Started Cymbalta.  Symptoms are slightly better.  Exam is confirmatory of neuropathy, likely from diabetes, feet care discussed. HTN: Continue losartan, BP today is 144/84, he had a somewhat difficult day at home, wife is in hospice.  Ambulatory BPs are typically normal.  No change High cholesterol: On Lipitor, checking labs. Vitamin D deficiency: On OTCs.  Checking levels Subclinical hypothyroidism: Slight increased TSH, checking labs. RTC 6 months next   In addition to CPX, I addressed all his chronic medical problems including a new ones (subclinical hypothyroidism, vitamin D deficiency, neuropathy)  this visit occurred during the SARS-CoV-2 public health emergency.   Safety protocols were in place, including screening questions prior to the visit, additional usage of staff PPE, and extensive cleaning of exam room while observing appropriate contact time as indicated for disinfecting solutions.

## 2021-07-28 ENCOUNTER — Encounter: Payer: Self-pay | Admitting: Internal Medicine

## 2021-07-28 LAB — LIPID PANEL
Cholesterol: 110 mg/dL (ref 0–200)
HDL: 32.5 mg/dL — ABNORMAL LOW (ref >39.00)
LDL Cholesterol: 41 mg/dL (ref 0–99)
NonHDL: 77.14
Total CHOL/HDL Ratio: 3
Triglycerides: 180 mg/dL — ABNORMAL HIGH (ref 0.0–149.0)
VLDL: 36 mg/dL (ref 0.0–40.0)

## 2021-07-28 LAB — COMPREHENSIVE METABOLIC PANEL
ALT: 13 U/L (ref 0–53)
AST: 15 U/L (ref 0–37)
Albumin: 4.2 g/dL (ref 3.5–5.2)
Alkaline Phosphatase: 69 U/L (ref 39–117)
BUN: 19 mg/dL (ref 6–23)
CO2: 28 mEq/L (ref 19–32)
Calcium: 9.4 mg/dL (ref 8.4–10.5)
Chloride: 105 mEq/L (ref 96–112)
Creatinine, Ser: 0.92 mg/dL (ref 0.40–1.50)
GFR: 79.16 mL/min (ref >60.00)
Glucose, Bld: 122 mg/dL — ABNORMAL HIGH (ref 70–99)
Potassium: 4.3 mEq/L (ref 3.5–5.1)
Sodium: 142 mEq/L (ref 135–145)
Total Bilirubin: 0.9 mg/dL (ref 0.2–1.2)
Total Protein: 6.4 g/dL (ref 6.0–8.3)

## 2021-07-28 LAB — VITAMIN D 25 HYDROXY (VIT D DEFICIENCY, FRACTURES): VITD: 28.3 ng/mL — ABNORMAL LOW (ref 30.00–100.00)

## 2021-07-28 LAB — HEMOGLOBIN A1C: Hgb A1c MFr Bld: 6.2 % (ref 4.6–6.5)

## 2021-07-28 LAB — T4, FREE: Free T4: 0.69 ng/dL (ref 0.60–1.60)

## 2021-07-28 LAB — TSH: TSH: 4.28 u[IU]/mL (ref 0.35–5.50)

## 2021-07-28 NOTE — Assessment & Plan Note (Signed)
°-  Td 2016 - pnm 23: 2011, booster 07-2020.   -Prevnar 2016 - shingrex:  cost is an issue  -  covid vax : UTD - s/p flu shot  -CCS:  Never had a colonoscopy,  not interested in CCS at this point. -Prostate cancer screening: we agreed no further screenings per guidelines  - labs: CMP, FLP, A1c, TSH, T4, vitamin D -Diet and exercise discussed - Fall prevention discussed - Has ACP documents, recommend to send [place] a copy

## 2021-07-28 NOTE — Assessment & Plan Note (Signed)
Here for CPX: DM: Diet controlled.  Has developed neuropathy.  Check A1c Neuropathy: Saw Dr. Nani Ravens few months ago with paresthesias, TSH was slightly elevated, vitamin D low.  He is currently taking OTC vitamin D.  Started Cymbalta.  Symptoms are slightly better.  Exam is confirmatory of neuropathy, likely from diabetes, feet care discussed. HTN: Continue losartan, BP today is 144/84, he had a somewhat difficult day at home, wife is in hospice.  Ambulatory BPs are typically normal.  No change High cholesterol: On Lipitor, checking labs. Vitamin D deficiency: On OTCs.  Checking levels Subclinical hypothyroidism: Slight increased TSH, checking labs. RTC 6 months next

## 2021-07-29 ENCOUNTER — Telehealth: Payer: Self-pay | Admitting: Internal Medicine

## 2021-07-29 MED ORDER — VITAMIN D (ERGOCALCIFEROL) 1.25 MG (50000 UNIT) PO CAPS: 50000.0000 [IU] | ORAL_CAPSULE | ORAL | 0 refills | Status: DC

## 2021-07-29 NOTE — Addendum Note (Signed)
Addended byDamita Dunnings D on: 07/29/2021 02:20 PM   Modules accepted: Orders

## 2021-07-29 NOTE — Telephone Encounter (Signed)
[facility]- Ergocalciferol not covered by insurance however  for 12 capsules is $28.99- they will try to find a discount card for him and let Pt know.

## 2021-07-29 NOTE — Telephone Encounter (Signed)
Pt states  Vitamin D, Ergocalciferol, (DRISDOL) 1.25 MG (50000 UNIT) CAPS capsule   is not covered by insurance and he will need something else. Please advise.     [id number] [place] [facility], [place] - [address] [place] [address]  Phone:  [phone]  Fax:  [phone]

## 2021-08-15 ENCOUNTER — Other Ambulatory Visit: Payer: Self-pay | Admitting: Internal Medicine

## 2021-09-03 DIAGNOSIS — Z79891 Long term (current) use of opiate analgesic: Secondary | ICD-10-CM | POA: Diagnosis not present

## 2021-09-03 DIAGNOSIS — M4807 Spinal stenosis, lumbosacral region: Secondary | ICD-10-CM | POA: Diagnosis not present

## 2021-09-03 DIAGNOSIS — M5431 Sciatica, right side: Secondary | ICD-10-CM | POA: Diagnosis not present

## 2021-09-03 DIAGNOSIS — Z79899 Other long term (current) drug therapy: Secondary | ICD-10-CM | POA: Diagnosis not present

## 2021-09-03 DIAGNOSIS — G894 Chronic pain syndrome: Secondary | ICD-10-CM | POA: Diagnosis not present

## 2021-09-12 IMAGING — DX DG HAND COMPLETE 3+V*R*
3 series · 3 of 3 positions shown · non-contrast
Comparison: 11/08/2011

CLINICAL DATA: Right hand pain base of thumb

EXAM:
RIGHT HAND - COMPLETE 3+ VIEW

[hand pa]
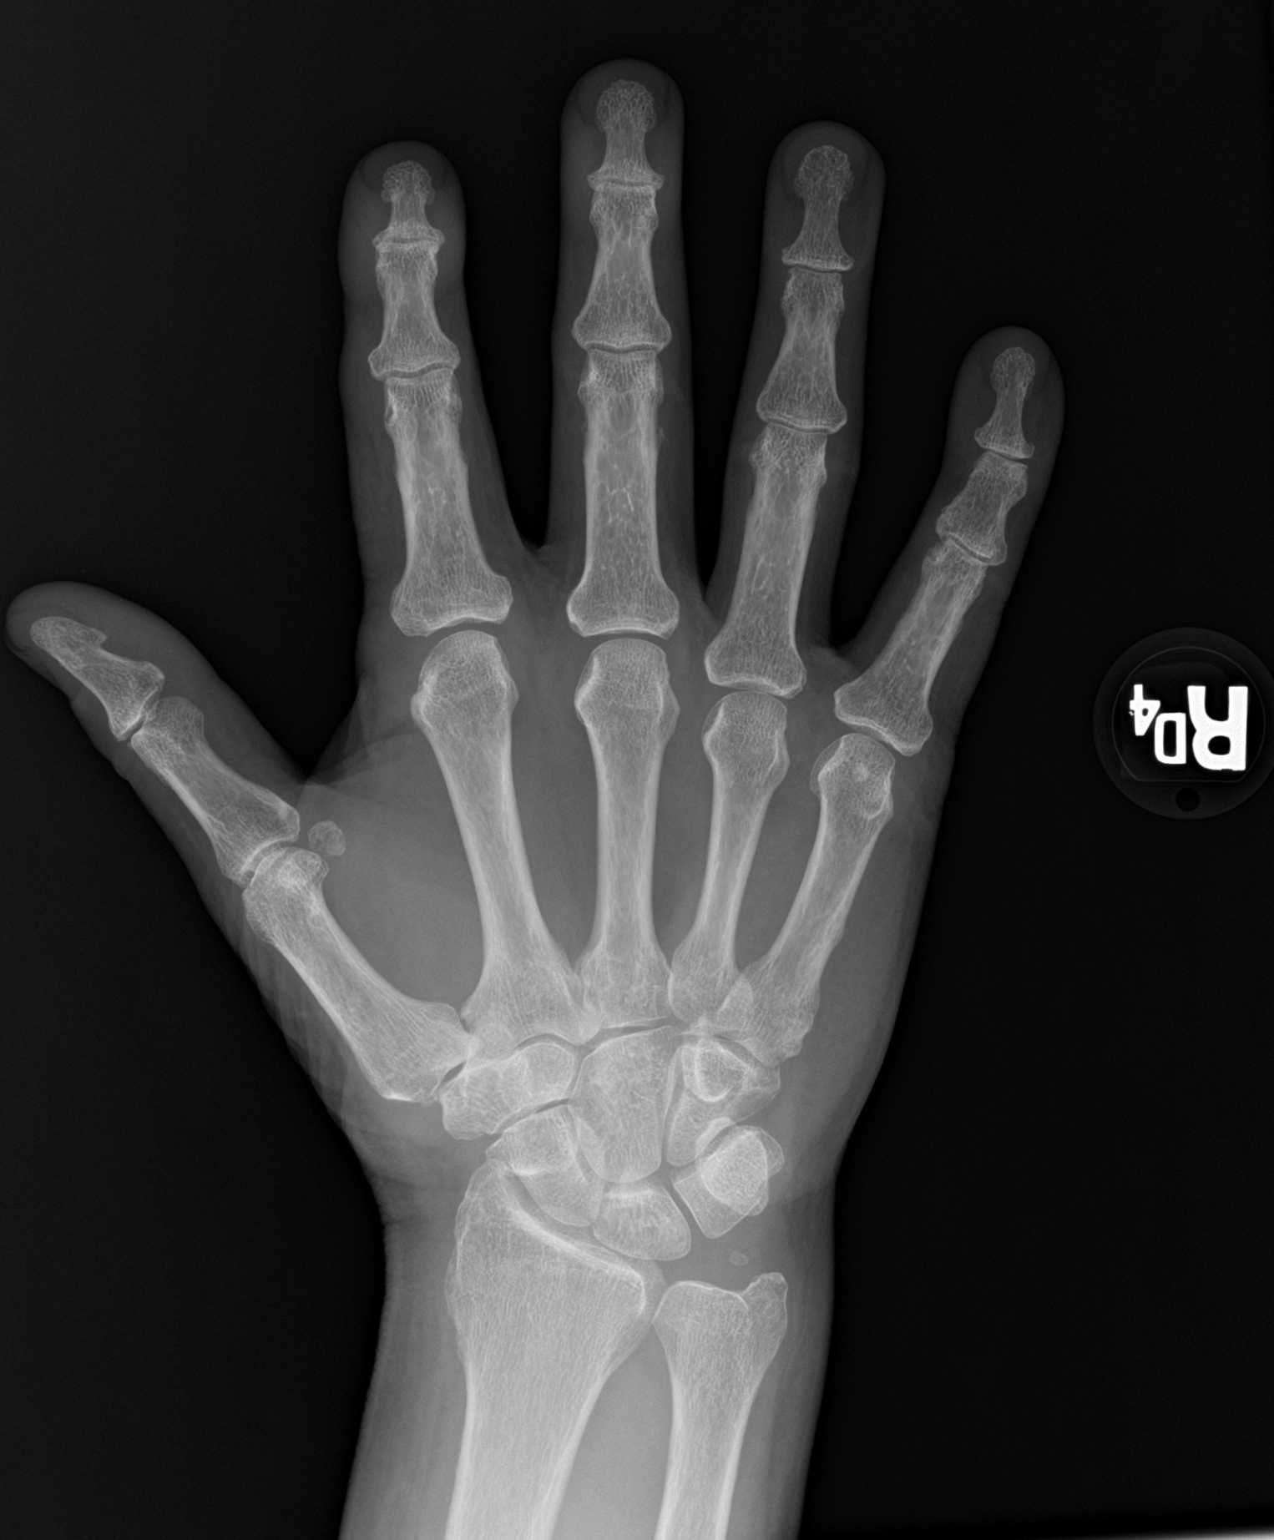

[hand obl]
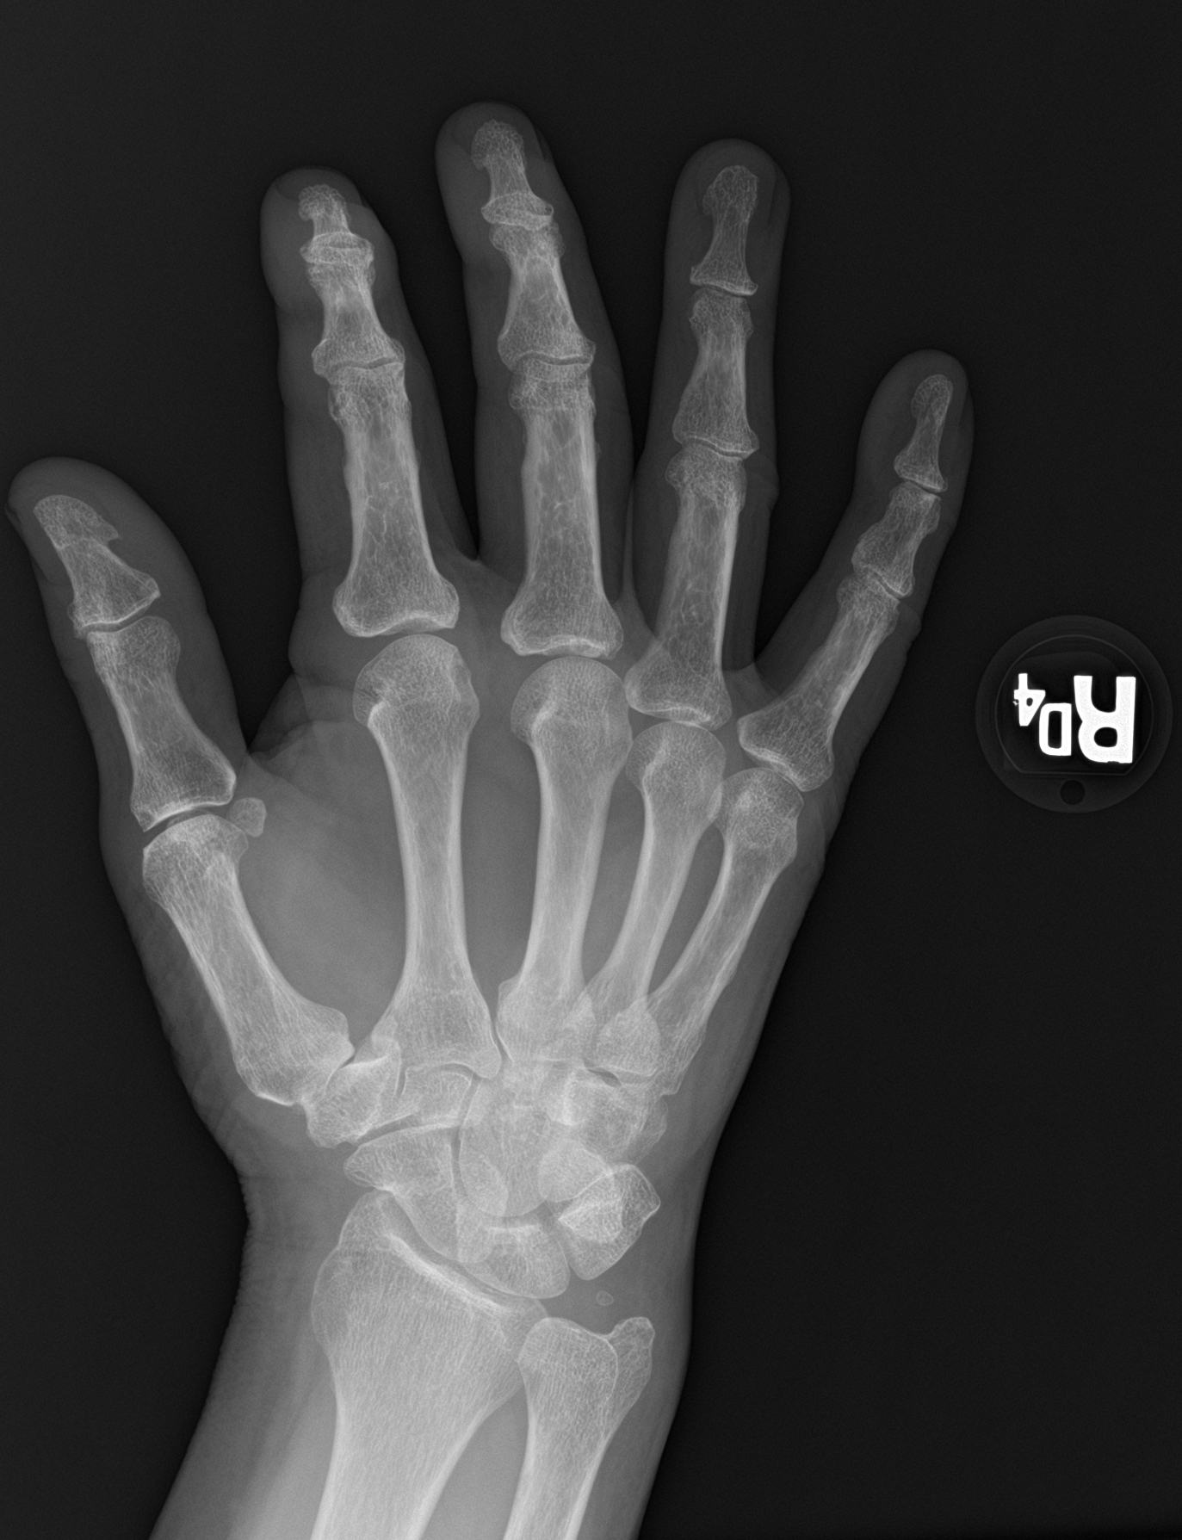

[hand lat]
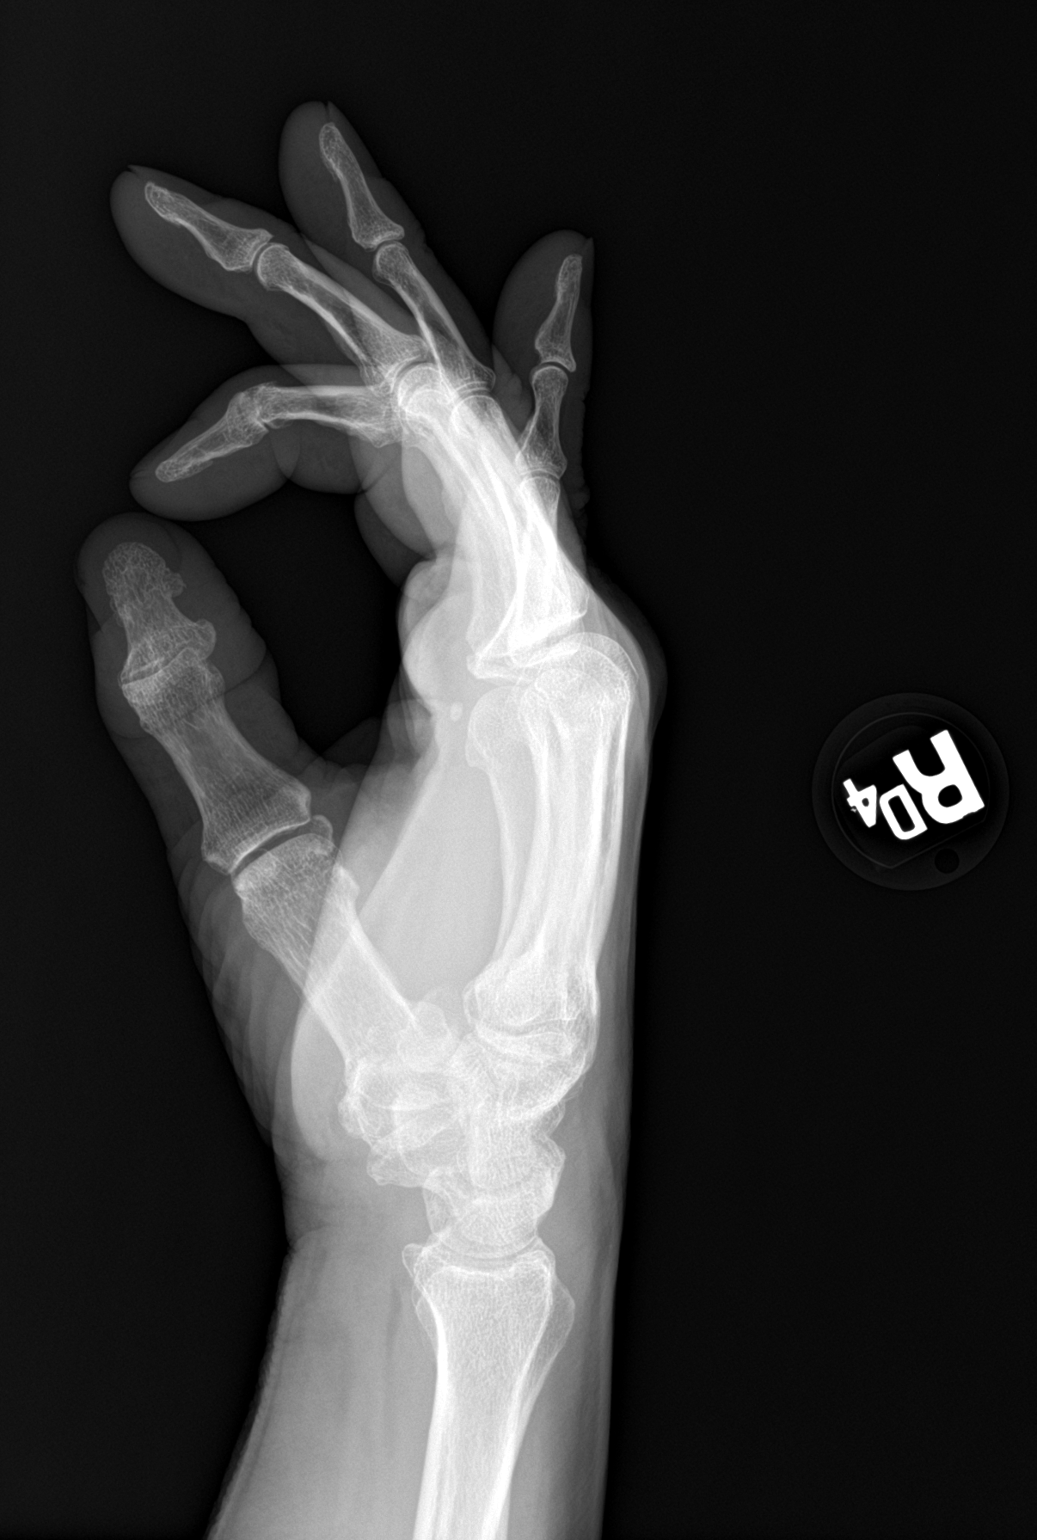

[3 of 3 positions shown; findings below may reference images not displayed]

FINDINGS: No fracture or malalignment. Mild degenerative change at the first
MCP joint. Mild degenerative changes at the D IP joints. Soft
tissues are unremarkable
IMPRESSION: No acute osseous abnormality.  Mild degenerative changes

## 2021-09-23 ENCOUNTER — Ambulatory Visit (INDEPENDENT_AMBULATORY_CARE_PROVIDER_SITE_OTHER): Payer: PPO | Admitting: Pharmacist

## 2021-09-23 DIAGNOSIS — I10 Essential (primary) hypertension: Secondary | ICD-10-CM

## 2021-09-23 DIAGNOSIS — G8929 Other chronic pain: Secondary | ICD-10-CM

## 2021-09-23 DIAGNOSIS — E1169 Type 2 diabetes mellitus with other specified complication: Secondary | ICD-10-CM

## 2021-09-23 DIAGNOSIS — E7800 Pure hypercholesterolemia, unspecified: Secondary | ICD-10-CM

## 2021-09-23 DIAGNOSIS — R202 Paresthesia of skin: Secondary | ICD-10-CM

## 2021-09-23 NOTE — Chronic Care Management (AMB) (Signed)
? ? ?Chronic Care Management ?Pharmacy Note ? ?09/23/2021 ?Name:  Bradley Navarro MRN:  [id number] DOB:  [birth year] ? ?Summary: ?Patient states today that he does not feel that duloxetine 28m has improve neuropathic pain. He is wondering if he should continue or if there were other alternatives. He has taken gabapentin in past but stopped due to nausea. He is the primary care giver for his wife who is in Hospice so non sedating options would be most appropriate. Consulting with PCP about alternatives to duloxetine.: ?Venlafaxine 37.549mdaily - titrating to 15013maily ?Amitriptyline 32m39m bedtime - Sedating so might not be best option ?Pregabalin 25mg68mrating to tid and up to 75-150mg 67my  ?Capasaicin cream 4 times a day ?Alpha lipoic acid 600mg d48m ?Valproic acid or carbamazepine.  ? ?Reviewed vaccination history; Due to have Shingrix series but has not gotten past due to cost. Discussed getting Shingrix vaccine series. Patient plans to get in 2023 since Medicare is covering now. [facility] ?RichardDEVERE BREM80 y.o.27ear old male who is a primary patient of Paz, Jose E,Alda BertholdThe CCM team was consulted for assistance with disease management and care coordination needs.   ? ?Engaged with patient by telephone for follow up visit in response to provider referral for pharmacy case management and/or care coordination services.  ? ?Consent to Services:  ?The patient was given information about Chronic Care Management services, agreed to services, and gave verbal consent prior to initiation of services.  Please see initial visit note for detailed documentation.  ? ?Patient Care Team: ?Paz, JoColon Branch PCP - General ?CzinskyAllie Bossier PUtahsician Assistant (Orthopedic Surgery) ?Saullo,Zonia Kief Consulting Physician (Rehabilitation) ?Bell, ELucas Mallow Consulting Physician (Urology) ?Haverstock, ChristiJennefer Bravo Referring  Physician (Dermatology) ?Edwar Coe,Cherre RobinsPP (Pharmacist) ? ?Recent office visits: ?07/27/2021 - Int Med (Dr Paz) FoLarose Kellsw up chronic conditions. Checked labs. Vitamin D still low - recommended 50,000IU weekly for 3 months, then 2000IU daily. F/U 6 months.  ?03/12/2021 - PCP (Dr WedlingJone Basemanthesias in feet. Started duloxetine 20mg da49m Also noted TSH was elevated - T4 added and vitamin D low at 28 - added OTC vitamin D3 1000 IU daily. ? ?Recent consult visits: ?04/16/2021 - Physical Med and Rehab (Dr Saullo) Maia Pettiesor sciatica and spinal stenosis ? ? ?Hospital visits: ?None in previous 6 months ? ?Objective: ? ?Lab Results  ?Component Value Date  ? CREATININE 0.92 07/27/2021  ? CREATININE 0.95 01/18/2021  ? CREATININE 0.94 07/21/2020  ? ? ?Lab Results  ?Component Value Date  ? HGBA1C 6.2 07/27/2021  ? ?Last diabetic Eye exam: No results found for: HMDIABEYEEXA  ?Last diabetic Foot exam: No results found for: HMDIABFOOTEX  ? ?   ?Component Value Date/Time  ? CHOL 110 07/27/2021 1609  ? TRIG 180.0 (H) 07/27/2021 1609  ? HDL 32.50 (L) 07/27/2021 1609  ? CHOLHDL 3 07/27/2021 1609  ? VLDL 36.0 07/27/2021 1609  ? LDLCALC [place]7/2023 1609  ? LDLDIRECT 74.0 07/21/2020 1329  ? ? ? ?  Latest Ref Rng & Units 07/27/2021  ?  4:09 PM 07/21/2020  ?  1:29 PM 05/02/2019  ?  9:12 AM  ?Hepatic Function  ?Total Protein 6.0 - 8.3 g/dL 6.4   6.7     ?Albumin 3.5 - 5.2 g/dL 4.2   4.3     ?AST 0 - 37  U/L 15   13   19     ?ALT 0 - 53 U/L 13   12   18     ?Alk Phosphatase 39 - 117 U/L 69   79     ?Total Bilirubin 0.2 - 1.2 mg/dL 0.9   0.8     ? ? ?Lab Results  ?Component Value Date/Time  ? TSH 4.28 07/27/2021 04:09 PM  ? TSH 5.06 (H) 03/12/2021 02:40 PM  ? FREET4 0.69 07/27/2021 04:09 PM  ? ? ? ?  Latest Ref Rng & Units 01/18/2021  ?  3:22 PM 12/31/2019  ?  8:11 AM 03/21/2019  ?  1:51 PM  ?CBC  ?WBC 4.0 - 10.5 K/uL 5.9   5.7   14.4    ?Hemoglobin 13.0 - 17.0 g/dL 13.1   13.9   13.3    ?Hematocrit 39.0 - 52.0 % 37.8   39.8   39.7    ?Platelets  150.0 - 400.0 K/uL 225.0   201.0   263.0    ? ? ?Lab Results  ?Component Value Date/Time  ? VD25OH 28.30 (L) 07/27/2021 04:09 PM  ? VD25OH 28 (L) 03/12/2021 02:40 PM  ? ? ?Clinical ASCVD: No  ?The ASCVD Risk score (Arnett DK, et al., 2019) failed to calculate for the following reasons: ?  The valid total cholesterol range is 130 to 320 mg/dL   ? ?Social History  ? ?Tobacco Use  ?Smoking Status Former  ?Smokeless Tobacco Never  ?Tobacco Comments  ? quit in the 90, light smoker   ? ?BP Readings from Last 3 Encounters:  ?07/27/21 (!) 144/84  ?03/12/21 118/68  ?01/18/21 122/68  ? ?Pulse Readings from Last 3 Encounters:  ?07/27/21 81  ?03/12/21 92  ?01/18/21 68  ? ?Wt Readings from Last 3 Encounters:  ?07/27/21 215 lb 2 oz (97.6 kg)  ?03/12/21 215 lb (97.5 kg)  ?01/18/21 218 lb 2 oz (98.9 kg)  ? ? ?Assessment: Review of patient past medical history, allergies, medications, health status, including review of consultants reports, laboratory and other test data, was performed as part of comprehensive evaluation and provision of chronic care management services.  ? ?SDOH:  (Social Determinants of Health) assessments and interventions performed:  ? ? ? ?CCM Care Plan ? ?Allergies  ?Allergen Reactions  ? Penicillins Swelling  ?  redness diffusely 1962  ? Gabapentin Nausea Only  ? ? ?Medications Reviewed Today   ? ? Reviewed by Cherre Robins, RPH-CPP (Pharmacist) on 09/23/21 at 24  Med List Status: <None>  ? ?Medication Order Taking? Sig Documenting Provider Last Dose Status Informant  ?atorvastatin (LIPITOR) 10 MG tablet [id number] Yes TAKE 1 TABLET BY MOUTH EVERYDAY AT BEDTIME Colon Branch, MD Taking Active   ?DULoxetine (CYMBALTA) 20 MG capsule [id number] Yes TAKE 1 CAPSULE BY MOUTH EVERY DAY Wendling, Crosby Oyster, DO Taking Active   ?losartan (COZAAR) 100 MG tablet [id number] Yes TAKE 1 TABLET BY MOUTH EVERY DAY Colon Branch, MD Taking Active   ?morphine (MS CONTIN) 30 MG 12 hr tablet [id number] Yes Take 30 mg by mouth in the  morning and at bedtime.  [provider] Taking Active   ?         ?Med Note (CANTER, KAYLYN D   Thu Mar 16, 2017 10:26 AM)    ?Multiple Vitamin (MULITIVITAMIN WITH MINERALS) TABS [id number] Yes Take 1 tablet by mouth daily. [provider] Taking Active Multiple Informants  ?omeprazole (PRILOSEC) 20 MG capsule [id number] Yes Take 1 capsule (20  mg total) by mouth 2 (two) times daily.  ?Patient taking differently: Take 20 mg by mouth daily.  ? Tanna Furry, MD Taking Active   ?         ?Med Note Unk Lightning   Tue Mar 23, 2021 10:52 AM)    ?tamsulosin (FLOMAX) 0.4 MG CAPS capsule [id number] Yes Take 0.4 mg by mouth daily. [provider] Taking Active   ?Vitamin D, Cholecalciferol, 25 MCG (1000 UT) CAPS [id number] No Take 2,000 Units by mouth daily.  ?Patient not taking: Reported on 09/23/2021  ? [provider] Not Taking Active   ?Vitamin D, Ergocalciferol, (DRISDOL) 1.25 MG (50000 UNIT) CAPS capsule [id number] Yes Take 1 capsule (50,000 Units total) by mouth every 7 (seven) days. Colon Branch, MD Taking Active   ? ?  ?  ? ?  ? ? ?Patient Active Problem List  ? Diagnosis Date Noted  ? SCC (squamous cell carcinoma) 02/28/2020  ? High cholesterol 10/30/2018  ? Diabetes ([place]) 10/30/2018  ? Hypertension 04/24/2017  ? Annual physical exam 04/17/2015  ? PCP NOTES >>>> 04/17/2015  ? Chronic back pain 08/17/2010  ? HEADACHE 02/09/2010  ? ? ?Immunization History  ?Administered Date(s) Administered  ? Fluad Quad(high Dose 65+) 05/02/2019, 03/26/2020  ? Influenza Split 03/30/2011, 03/28/2012  ? Influenza Whole 03/02/2010  ? Influenza, High Dose Seasonal PF 03/17/2015, 04/19/2016, 03/16/2017, 03/26/2018  ? Influenza,inj,Quad PF,6+ Mos 03/20/2013, 03/25/2014  ? Influenza-Unspecified 03/15/2021  ? PFIZER(Purple Top)SARS-COV-2 Vaccination 08/02/2019, 08/24/2019, 03/20/2020, 10/26/2020  ? Pension scheme manager 72yr & up 02/19/2021  ? Pneumococcal Conjugate-13 04/17/2015  ?  Pneumococcal Polysaccharide-23 03/02/2010, 07/21/2020  ? Td 04/17/2015  ? ? ?Conditions to be addressed/monitored: ?HTN, HLD, and BPH, chronic back pain, elevated TSH; low vitamin D; type 2 DM; GERD ? ?Care Pla

## 2021-09-23 NOTE — Patient Instructions (Addendum)
Bradley Navarro ?It was a pleasure speaking with you  ?Below is a summary of your health goals and care plan ? ?If you have any questions or concerns, please feel free to contact me either at the phone number below or with a MyChart message.  ? ?Keep up the good work! ? ?Cherre Robins, PharmD ?Clinical Pharmacist ?[facility] ?[place] ?[phone] (direct line)  ?[phone] (main office number) ? ? ?Chronic Care Management Care Plan ? ?Hypertension ?BP Readings from Last 3 Encounters:  ?07/27/21 (!) 144/84  ?03/12/21 118/68  ?01/18/21 122/68  ? ?Pharmacist Clinical Goal(s): ?Over the next 90 days, patient will work with PharmD and providers to achieve BP goal <140/90 ?Current regimen:  ?Losartan '100mg'$  daily ?Interventions: ?Discussed blood pressure  goal  ?Patient self care activities - Over the next 90 days, patient will: ?Check blood pressure 2-3 times per week, document, and provide at future appointments ?Ensure daily salt intake < 2300 mg/day ? ?Hyperlipidemia ?Lab Results  ?Component Value Date/Time  ? [place] 41 07/27/2021 04:09 PM  ? LDLDIRECT 74.0 07/21/2020 01:29 PM  ?Last triglycerides were 180 (07/27/2021 - improved from previous 07/21/2020, when were 224) ? ?Pharmacist Clinical Goal(s): ?Over the next 90 days, patient will work with PharmD and providers to maintain LDL goal < 100 and attain triglycerides <150 ?Current regimen:  ?Atorvastatin '10mg'$  daily at bedtime ?Interventions: ?Discussed LDL and triglycerides goal  ?Recommended recheck lipids at upcoming appointment with Dr Larose Kells 07/21/2021 ?Patient self care activities - Over the next 90 days, patient will: ?Maintain cholesterol medication regimen.  ?Limit intake of sweets ?Great job not drinking sodas!! ? ?Pre-Diabetes ?Lab Results  ?Component Value Date/Time  ? HGBA1C 6.2 07/27/2021 04:09 PM  ? HGBA1C 6.4 01/18/2021 03:22 PM  ? ?Pharmacist Clinical Goal(s): ?Over the next 90 days, patient will work with PharmD and providers  to maintain A1c goal <6.5% ?Current regimen:  ?Diet and exercise management   ?Interventions: ?Discussed A1c goal ?Discussed diet and exercise ?Patient self care activities - Over the next 90 days, patient will: ?Maintain A1c less than 6.5% ?Continue to limit intake of sugar.  ? ?Chronic Back Pain:  ?Current Regimen:  ?Morphine ER '30mg'$  twice a day ?Duloxetine '20mg'$  daily (started 03/12/2021) ?Interventions: ?Recommended continue current therapy and follow up with pain clinic ?Continue to take duloxetine with food - I am checking with Dr Larose Kells about possible alternative to duloxetine.  ? ? ?Health Maintenance:  ?Reviewed vaccination history; Due to have Shingrix series  ?Intervention:  ?Discussed getting Shingrix vaccine series. Patient plans to get in 2023 since Medicare is covering now. [facility] ? ?Medication management ?Pharmacist Clinical Goal(s): ?Over the next 90 days, patient will work with PharmD and providers to maintain optimal medication adherence ?[facility] ?Interventions ?Comprehensive medication review performed. ?Continue current medication management strategy ?Patient self care activities - Over the next 90 days, patient will: ?Focus on medication adherence by filling and taking medications appropriately  ?Take medications as prescribed ?Report any questions or concerns to PharmD and/or provider(s) ?

## 2021-10-11 ENCOUNTER — Other Ambulatory Visit: Payer: Self-pay | Admitting: Internal Medicine

## 2021-10-15 ENCOUNTER — Telehealth: Payer: Self-pay | Admitting: Pharmacist

## 2021-10-15 NOTE — Telephone Encounter (Signed)
Spoke with patient about increasing duloxetine to '40mg'$  daily. He states he stopped duloxetine a few days ago because it was not helping. Encouraged him to try higher dose.  ?Also discussed using over-the-counter Capsaicin cream. Discussed washing hands after each application. Patient will try capsaicin cream.  ?

## 2021-10-15 NOTE — Telephone Encounter (Signed)
-----   Message from Colon Branch, MD sent at 09/27/2021  8:35 PM EDT ----- ?Think he is already on duloxetine 20 mg, recommend to increase to 2 tablets daily, 40 mg qd. ?Also capsaicin cream OTC would be appropriate. ?Thx! ?JP ?----- Message ----- ?From: Cherre Robins, RPH-CPP ?Sent: 09/23/2021  12:35 PM EDT ?To: Colon Branch, MD ? ?Patient states today that he does not feel that duloxetine '20mg'$  has improve neuropathic pain. He is wondering if he should continue or if there were other alternatives. He has taken gabapentin in past but stopped due to nausea. He is the primary care giver for his wife who is in Hospice so non sedating options would be most appropriate. Possible options below. Would you like him to make appointment? In person or video? ? Increase duloxetine to 30 to '60mg'$  daily ?? Venlafaxine 37.'5mg'$  daily - titrating to '150mg'$  daily ?? Amitriptyline '10mg'$  at bedtime - Sedating so might not be best option ?? Pregabalin '25mg'$  titrating to tid and up to 75-'150mg'$  daily  ?? Capasaicin cream 4 times a day ?? Alpha lipoic acid '600mg'$  daily ?? Valproic acid or carbamazepine.  ? ?

## 2021-10-17 DIAGNOSIS — E7800 Pure hypercholesterolemia, unspecified: Secondary | ICD-10-CM | POA: Diagnosis not present

## 2021-10-17 DIAGNOSIS — E1169 Type 2 diabetes mellitus with other specified complication: Secondary | ICD-10-CM

## 2021-10-17 DIAGNOSIS — I10 Essential (primary) hypertension: Secondary | ICD-10-CM | POA: Diagnosis not present

## 2021-10-29 DIAGNOSIS — M4807 Spinal stenosis, lumbosacral region: Secondary | ICD-10-CM | POA: Diagnosis not present

## 2021-10-29 DIAGNOSIS — M5431 Sciatica, right side: Secondary | ICD-10-CM | POA: Diagnosis not present

## 2021-11-24 ENCOUNTER — Ambulatory Visit: Payer: PPO

## 2021-11-25 ENCOUNTER — Ambulatory Visit: Payer: PPO

## 2021-12-02 ENCOUNTER — Ambulatory Visit (INDEPENDENT_AMBULATORY_CARE_PROVIDER_SITE_OTHER): Payer: PPO

## 2021-12-02 DIAGNOSIS — Z0000 Encounter for general adult medical examination without abnormal findings: Secondary | ICD-10-CM

## 2021-12-02 NOTE — Patient Instructions (Signed)
[name] , Thank you for taking time to come for your Medicare Wellness Visit. I appreciate your ongoing commitment to your health goals. Please review the following plan we discussed and let me know if I can assist you in the future.   Screening recommendations/referrals: Colonoscopy: no longer needed Recommended yearly ophthalmology/optometry visit for glaucoma screening and checkup Recommended yearly dental visit for hygiene and checkup  Vaccinations: Influenza vaccine: up to date Pneumococcal vaccine: up to date Tdap vaccine: up to date Shingles vaccine: Due-May obtain vaccine at our office or your local pharmacy.    Covid-19: completed  Advanced directives: yes, not on file  Conditions/risks identified: see problem list  Next appointment: Follow up in one year for your annual wellness visit. 12/06/22  Preventive Care 65 Years and Older, Male Preventive care refers to lifestyle choices and visits with your health care provider that can promote health and wellness. What does preventive care include? A yearly physical exam. This is also called an annual well check. Dental exams once or twice a year. Routine eye exams. Ask your health care provider how often you should have your eyes checked. Personal lifestyle choices, including: Daily care of your teeth and gums. Regular physical activity. Eating a healthy diet. Avoiding tobacco and drug use. Limiting alcohol use. Practicing safe sex. Taking low doses of aspirin every day. Taking vitamin and mineral supplements as recommended by your health care provider. What happens during an annual well check? The services and screenings done by your health care provider during your annual well check will depend on your age, overall health, lifestyle risk factors, and family history of disease. Counseling  Your health care provider may ask you questions about your: Alcohol use. Tobacco use. Drug use. Emotional well-being. Home  and relationship well-being. Sexual activity. Eating habits. History of falls. Memory and ability to understand (cognition). Work and work Statistician. Screening  You may have the following tests or measurements: Height, weight, and BMI. Blood pressure. Lipid and cholesterol levels. These may be checked every 5 years, or more frequently if you are over 24 years old. Skin check. Lung cancer screening. You may have this screening every year starting at age 33 if you have a 30-pack-year history of smoking and currently smoke or have quit within the past 15 years. Fecal occult blood test (FOBT) of the stool. You may have this test every year starting at age 41. Flexible sigmoidoscopy or colonoscopy. You may have a sigmoidoscopy every 5 years or a colonoscopy every 10 years starting at age 80. Prostate cancer screening. Recommendations will vary depending on your family history and other risks. Hepatitis C blood test. Hepatitis B blood test. Sexually transmitted disease (STD) testing. Diabetes screening. This is done by checking your blood sugar (glucose) after you have not eaten for a while (fasting). You may have this done every 1-3 years. Abdominal aortic aneurysm (AAA) screening. You may need this if you are a current or former smoker. Osteoporosis. You may be screened starting at age 45 if you are at high risk. Talk with your health care provider about your test results, treatment options, and if necessary, the need for more tests. Vaccines  Your health care provider may recommend certain vaccines, such as: Influenza vaccine. This is recommended every year. Tetanus, diphtheria, and acellular pertussis (Tdap, Td) vaccine. You may need a Td booster every 10 years. Zoster vaccine. You may need this after age 39. Pneumococcal 13-valent conjugate (PCV13) vaccine. One dose is recommended after age 21.  Pneumococcal polysaccharide (PPSV23) vaccine. One dose is recommended after age 55. Talk to  your health care provider about which screenings and vaccines you need and how often you need them. This information is not intended to replace advice given to you by your health care provider. Make sure you discuss any questions you have with your health care provider. Document Released: 07/03/2015 Document Revised: 02/24/2016 Document Reviewed: 04/07/2015 Elsevier Interactive Patient Education  2017 [employer] Prevention in the Home Falls can cause injuries. They can happen to people of all ages. There are many things you can do to make your home safe and to help prevent falls. What can I do on the outside of my home? Regularly fix the edges of walkways and driveways and fix any cracks. Remove anything that might make you trip as you walk through a door, such as a raised step or threshold. Trim any bushes or trees on the path to your home. Use bright outdoor lighting. Clear any walking paths of anything that might make someone trip, such as rocks or tools. Regularly check to see if handrails are loose or broken. Make sure that both sides of any steps have handrails. Any raised decks and porches should have guardrails on the edges. Have any leaves, snow, or ice cleared regularly. Use sand or salt on walking paths during winter. Clean up any spills in your garage right away. This includes oil or grease spills. What can I do in the bathroom? Use night lights. Install grab bars by the toilet and in the tub and shower. Do not use towel bars as grab bars. Use non-skid mats or decals in the tub or shower. If you need to sit down in the shower, use a plastic, non-slip stool. Keep the floor dry. Clean up any water that spills on the floor as soon as it happens. Remove soap buildup in the tub or shower regularly. Attach bath mats securely with double-sided non-slip rug tape. Do not have throw rugs and other things on the floor that can make you trip. What can I do in the bedroom? Use  night lights. Make sure that you have a light by your bed that is easy to reach. Do not use any sheets or blankets that are too big for your bed. They should not hang down onto the floor. Have a firm chair that has side arms. You can use this for support while you get dressed. Do not have throw rugs and other things on the floor that can make you trip. What can I do in the kitchen? Clean up any spills right away. Avoid walking on wet floors. Keep items that you use a lot in easy-to-reach places. If you need to reach something above you, use a strong step stool that has a grab bar. Keep electrical cords out of the way. Do not use floor polish or wax that makes floors slippery. If you must use wax, use non-skid floor wax. Do not have throw rugs and other things on the floor that can make you trip. What can I do with my stairs? Do not leave any items on the stairs. Make sure that there are handrails on both sides of the stairs and use them. Fix handrails that are broken or loose. Make sure that handrails are as long as the stairways. Check any carpeting to make sure that it is firmly attached to the stairs. Fix any carpet that is loose or worn. Avoid having throw rugs at the  top or bottom of the stairs. If you do have throw rugs, attach them to the floor with carpet tape. Make sure that you have a light switch at the top of the stairs and the bottom of the stairs. If you do not have them, ask someone to add them for you. What else can I do to help prevent falls? Wear shoes that: Do not have high heels. Have rubber bottoms. Are comfortable and fit you well. Are closed at the toe. Do not wear sandals. If you use a stepladder: Make sure that it is fully opened. Do not climb a closed stepladder. Make sure that both sides of the stepladder are locked into place. Ask someone to hold it for you, if possible. Clearly mark and make sure that you can see: Any grab bars or handrails. First and last  steps. Where the edge of each step is. Use tools that help you move around (mobility aids) if they are needed. These include: Canes. Walkers. Scooters. Crutches. Turn on the lights when you go into a dark area. Replace any light bulbs as soon as they burn out. Set up your furniture so you have a clear path. Avoid moving your furniture around. If any of your floors are uneven, fix them. If there are any pets around you, be aware of where they are. Review your medicines with your doctor. Some medicines can make you feel dizzy. This can increase your chance of falling. Ask your doctor what other things that you can do to help prevent falls. This information is not intended to replace advice given to you by your health care provider. Make sure you discuss any questions you have with your health care provider. Document Released: 04/02/2009 Document Revised: 11/12/2015 Document Reviewed: 07/11/2014 Elsevier Interactive Patient Education  2017 [employer].

## 2021-12-02 NOTE — Progress Notes (Cosign Needed Addendum)
Subjective:   Bradley Navarro is a 81 y.o. male who presents for Medicare Annual/Subsequent preventive examination.  I connected with  [name] on 12/02/21 by a audio enabled telemedicine application and verified that I am speaking with the correct person using two identifiers.  Patient Location: Home  Provider Location: Office/Clinic  I discussed the limitations of evaluation and management by telemedicine. The patient expressed understanding and agreed to proceed.   Review of Systems     Cardiac Risk Factors include: advanced age (>68mn, >>38women);hypertension;dyslipidemia     Objective:    Today's Vitals   12/02/21 0946  PainSc: 7    There is no height or weight on file to calculate BMI.     12/02/2021    9:44 AM 11/23/2020    9:51 AM 10/03/2019   11:07 AM 04/20/2015    8:25 AM  Advanced Directives  Does Patient Have a Medical Advance Directive? Yes Yes Yes No  Type of [place] of facility DNR (pink MOST or yellow form);Living will [place]   Does patient want to make changes to medical advance directive? No - Patient declined  No - Patient declined   Copy of [place] Chart? No - copy requested No - copy requested No - copy requested   Would patient like information on creating a medical advance directive?    Yes - Educational materials given    Current Medications (verified) Outpatient Encounter Medications as of 12/02/2021  Medication Sig   atorvastatin (LIPITOR) 10 MG tablet TAKE 1 TABLET BY MOUTH EVERYDAY AT BEDTIME   DULoxetine (CYMBALTA) 20 MG capsule TAKE 1 CAPSULE BY MOUTH EVERY DAY   losartan (COZAAR) 100 MG tablet TAKE 1 TABLET BY MOUTH EVERY DAY   morphine (MS CONTIN) 30 MG 12 hr tablet Take 30 mg by mouth in the morning and at bedtime.    Multiple Vitamin (MULITIVITAMIN WITH MINERALS) TABS Take 1 tablet by  mouth daily.   omeprazole (PRILOSEC) 20 MG capsule Take 1 capsule (20 mg total) by mouth 2 (two) times daily. (Patient taking differently: Take 20 mg by mouth daily.)   tamsulosin (FLOMAX) 0.4 MG CAPS capsule Take 0.4 mg by mouth daily.   Vitamin D, Cholecalciferol, 25 MCG (1000 UT) CAPS Take 2,000 Units by mouth daily.   Vitamin D, Ergocalciferol, (DRISDOL) 1.25 MG (50000 UNIT) CAPS capsule Take 1 capsule (50,000 Units total) by mouth every 7 (seven) days.   No facility-administered encounter medications on file as of 12/02/2021.    Allergies (verified) Penicillins and Gabapentin   History: Past Medical History:  Diagnosis Date   Chronic back pain    Elevated BP without diagnosis of hypertension 04/24/2017   History of headache    frontal lobe cluster headaches   History of kidney stones    Post laminectomy syndrome    SCC (squamous cell carcinoma) 01/2020   Sciatica of right side 2016   S/P L5-S1 revision lam/PSF on 04-10-12 w/ relief of RLE pain post op, MRI 01/2014 shows minimal foraminal stenosis at L4-5 w/o recurrent stenosis at L5-S1   Past Surgical History:  Procedure Laterality Date   [place]  2012   L-5, Dr FVennie Homans  LUMBAR FUSION  2015   rod, plates   r arm surger     Family History  Problem Relation Age of Onset   CAD  Father 47       MI    Dementia Mother    Colon cancer Neg Hx    Prostate cancer Neg Hx    Diabetes Neg Hx    Social History   Socioeconomic History   Marital status: Married    Spouse name: Not on file   Number of children: 2   Years of education: Not on file   Highest education level: Not on file  Occupational History   Occupation: retired  Tobacco Use   Smoking status: Former   Smokeless tobacco: Never   Tobacco comments:    quit in the 90, light smoker   Substance and Sexual Activity   Alcohol use: No    Alcohol/week: 0.0 standard drinks of alcohol   Drug use: No   Sexual activity: Not on file  Other  Topics Concern   Not on file  Social History Narrative   Household: pt, wife. Wife is with hospice at home      children 2 and 2 step daughters, has g-kids   Social Determinants of Health   Financial Resource Strain: Low Risk  (06/25/2021)   Overall Financial Resource Strain (CARDIA)    Difficulty of Paying Living Expenses: Not hard at all  Food Insecurity: No Food Insecurity (11/23/2020)   Hunger Vital Sign    Worried About Running Out of Food in the Last Year: Never true    Ran Out of Food in the Last Year: Never true  Transportation Needs: No Transportation Needs (11/23/2020)   PRAPARE - Hydrologist (Medical): No    Lack of Transportation (Non-Medical): No  Physical Activity: Inactive (03/23/2021)   Exercise Vital Sign    Days of Exercise per Week: 0 days    Minutes of Exercise per Session: 0 min  Stress: No Stress Concern Present (11/23/2020)   [place]    Feeling of Stress : Only a little  Social Connections: Socially Isolated (11/23/2020)   Social Connection and Isolation Panel [NHANES]    Frequency of Communication with Friends and Family: Once a week    Frequency of Social Gatherings with Friends and Family: Once a week    Attends Religious Services: Never    Marine scientist or Organizations: No    Attends Music therapist: Never    Marital Status: Married    Tobacco Counseling Counseling given: Not Answered Tobacco comments: quit in the 90, light smoker    Clinical Intake:  Pre-visit preparation completed: Yes  Pain : 0-10 Pain Score: 7  Pain Location: Foot Pain Descriptors / Indicators: Aching, Dull, Sore Pain Onset: More than a month ago Pain Frequency: Constant     Nutritional Risks: None Diabetes: No  How often do you need to have someone help you when you read instructions, pamphlets, or other written materials from your doctor or pharmacy?:  1 - Never  Diabetic?no   Interpreter Needed?: No  Information entered by :: [place] of Daily Living    12/02/2021    9:48 AM  In your present state of health, do you have any difficulty performing the following activities:  Hearing? 1  Vision? 0  Difficulty concentrating or making decisions? 0  Walking or climbing stairs? 1  Dressing or bathing? 0  Doing errands, shopping? 0  Preparing Food and eating ? N  Using the Toilet? N  In the past six months, have  you accidently leaked urine? N  Do you have problems with loss of bowel control? N  Managing your Medications? N  Managing your Finances? N  Housekeeping or managing your Housekeeping? N    Patient Care Team: Colon Branch, MD as PCP - General Czinsky, Stephani Police, [place] as Physician Assistant (Orthopedic Surgery) Zonia Kief, MD as Consulting Physician (Rehabilitation) Lucas Mallow, MD as Consulting Physician (Urology) [place], Jennefer Bravo, MD as Referring Physician (Dermatology) Cherre Robins, RPH-CPP (Pharmacist)  Indicate any recent Medical Services you may have received from other than [facility] providers in the past year (date may be approximate).     Assessment:   This is a routine wellness examination for Gloria.  Hearing/Vision screen No results found.  Dietary issues and exercise activities discussed: Current Exercise Habits: Home exercise routine, Type of exercise: walking, Time (Minutes): 30, Frequency (Times/Week): 7, Weekly Exercise (Minutes/Week): 210, Intensity: Mild, Exercise limited by: None identified   Goals Addressed               This Visit's Progress     Continue eating healthy and remain active.  (pt-stated)   On track     DIET - [place]   On track      Depression Screen    12/02/2021    9:46 AM 07/27/2021    3:37 PM 01/18/2021    3:00 PM 11/23/2020    9:53 AM 07/21/2020    1:29 PM 10/03/2019   11:11 AM 05/02/2019    8:30 AM  PHQ 2/9 Scores  PHQ  - 2 Score 0 0 0 0 0 0 0  PHQ- 9 Score     2      Fall Risk    12/02/2021    9:44 AM 07/27/2021    3:37 PM 01/18/2021    3:00 PM 11/23/2020    9:53 AM 07/21/2020   12:52 PM  Fall Risk   Falls in the past year? 0 0 0 0 0  Number falls in past yr: 0 0 0 0 0  Injury with Fall? 0 0 0 0 0  Risk for fall due to : No Fall Risks      Follow up Falls evaluation completed Falls evaluation completed Falls evaluation completed Falls prevention discussed     FALL RISK PREVENTION PERTAINING TO THE HOME:  Any stairs in or around the home? Yes  If so, are there any without handrails? No  Home free of loose throw rugs in walkways, pet beds, electrical cords, etc? Yes  Adequate lighting in your home to reduce risk of falls? Yes   ASSISTIVE DEVICES UTILIZED TO PREVENT FALLS:  Life alert? No  Use of a cane, walker or w/c? No  Grab bars in the bathroom? Yes  Shower chair or bench in shower? No  Elevated toilet seat or a handicapped toilet? No   TIMED UP AND GO:  Was the test performed? No .    Cognitive Function:    04/20/2015    8:42 AM  MMSE - Mini Mental State Exam  Orientation to time 5  Orientation to Place 5  Registration 3  Attention/ Calculation 5  Recall 3  Language- name 2 objects 2  Language- repeat 1  Language- follow 3 step command 3  Language- read & follow direction 1  Write a sentence 1  Copy design 1  Total score 30        12/02/2021    9:50 AM  6CIT Screen  What Year? 0 points  What month? 0 points  What time? 0 points  Count back from 20 0 points  Months in reverse 0 points  Repeat phrase 2 points  Total Score 2 points    Immunizations Immunization History  Administered Date(s) Administered   Fluad Quad(high Dose 65+) 05/02/2019, 03/26/2020   Influenza Split 03/30/2011, 03/28/2012   Influenza Whole 03/02/2010   Influenza, High Dose Seasonal PF 03/17/2015, 04/19/2016, 03/16/2017, 03/26/2018   Influenza,inj,Quad PF,6+ Mos 03/20/2013, 03/25/2014    Influenza-Unspecified 03/15/2021   PFIZER(Purple Top)SARS-COV-2 Vaccination 08/02/2019, 08/24/2019, 03/20/2020, 10/26/2020   Pfizer Covid-19 Vaccine Bivalent Booster 95yr & up 02/19/2021   Pneumococcal Conjugate-13 04/17/2015   Pneumococcal Polysaccharide-23 03/02/2010, 07/21/2020   Td 04/17/2015    TDAP status: Up to date  Flu Vaccine status: Up to date  Pneumococcal vaccine status: Up to date  Covid-19 vaccine status: Completed vaccines  Qualifies for Shingles Vaccine? Yes   Zostavax completed No   Shingrix Completed?: No.    Education has been provided regarding the importance of this vaccine. Patient has been advised to call insurance company to determine out of pocket expense if they have not yet received this vaccine. Advised may also receive vaccine at local pharmacy or Health Dept. Verbalized acceptance and understanding.  Screening Tests Health Maintenance  Topic Date Due   OPHTHALMOLOGY EXAM  Never done   Hepatitis C Screening  Never done   Zoster Vaccines- Shingrix (1 of 2) Never done   INFLUENZA VACCINE  01/18/2022   HEMOGLOBIN A1C  01/24/2022   FOOT EXAM  07/27/2022   TETANUS/TDAP  04/16/2025   Pneumonia Vaccine 80 Years old  Completed   COVID-19 Vaccine  Completed   HPV VACCINES  Aged Out    Health Maintenance  Health Maintenance Due  Topic Date Due   OPHTHALMOLOGY EXAM  Never done   Hepatitis C Screening  Never done   Zoster Vaccines- Shingrix (1 of 2) Never done    Colorectal cancer screening: No longer required.   Lung Cancer Screening: (Low Dose CT Chest recommended if Age 80-80years, 30 pack-year currently smoking OR have quit w/in 15years.) does not qualify.   Lung Cancer Screening Referral: N/A  Additional Screening:  Hepatitis C Screening: does not qualify; Completed aged out  Vision Screening: Recommended annual ophthalmology exams for early detection of glaucoma and other disorders of the eye. Is the patient up to date with their  annual eye exam?  No  Who is the provider or what is the name of the office in which the patient attends annual eye exams? N/A If pt is not established with a provider, would they like to be referred to a provider to establish care? No .   Dental Screening: Recommended annual dental exams for proper oral hygiene  Community Resource Referral / Chronic Care Management: CRR required this visit?  No   CCM required this visit?  No      Plan:     I have personally reviewed and noted the following in the patient's chart:   Medical and social history Use of alcohol, tobacco or illicit drugs  Current medications and supplements including opioid prescriptions. Patient is not currently taking opioid prescriptions. Functional ability and status Nutritional status Physical activity Advanced directives List of other physicians Hospitalizations, surgeries, and ER visits in previous 12 months Vitals Screenings to include cognitive, depression, and falls Referrals and appointments  In addition, I have reviewed and discussed with patient certain preventive protocols, quality metrics, and  best practice recommendations. A written personalized care plan for preventive services as well as general preventive health recommendations were provided to patient.   Due to this being a telephonic visit, the after visit summary with patients personalized plan was offered to patient via mail or my-chart. Patient declined at this time.   Duard Brady Lester Platas, [place]   12/02/2021   Nurse Notes: none

## 2021-12-24 ENCOUNTER — Ambulatory Visit (INDEPENDENT_AMBULATORY_CARE_PROVIDER_SITE_OTHER): Payer: PPO | Admitting: Pharmacist

## 2021-12-24 DIAGNOSIS — I10 Essential (primary) hypertension: Secondary | ICD-10-CM

## 2021-12-24 DIAGNOSIS — E7800 Pure hypercholesterolemia, unspecified: Secondary | ICD-10-CM

## 2021-12-24 DIAGNOSIS — E559 Vitamin D deficiency, unspecified: Secondary | ICD-10-CM

## 2021-12-24 DIAGNOSIS — E1169 Type 2 diabetes mellitus with other specified complication: Secondary | ICD-10-CM

## 2021-12-24 DIAGNOSIS — R202 Paresthesia of skin: Secondary | ICD-10-CM

## 2021-12-24 MED ORDER — DULOXETINE HCL 20 MG PO CPEP: ORAL_CAPSULE | ORAL | 0 refills | Status: DC

## 2021-12-24 NOTE — Chronic Care Management (AMB) (Signed)
Chronic Care Management Pharmacy Note  12/24/2021 Name:  Bradley Navarro MRN:  [id number] DOB:  Apr 26, 1941  Summary: Patient did not try higher dose of duloxetine as recommended at last visit - states he was not aware of recommendation. He will return duloxetine at 49m daily for 3 days, then increase to 41mdaily.  Has follow up with Dr PaLarose Kellsn 1 month. If not effective other options are: Venlafaxine 37.71m57maily - titrating to 150m30mily Amitriptyline 10mg171mbedtime - Sedating so might not be best option Pregabalin 271mg 26mating to tid and up to 75-150mg d39m  Alpha lipoic acid 600mg da94mValproic acid or carbamazepine.  Patient also did not start over-the-counter vitamin D 2000 units daily after completed weekly vitamin D. He will get over-the-counter vitamin D at pharmacy.   Reviewed vaccination history; Due to have Shingrix series but has not gotten past due to cost. Discussed getting Shingrix vaccine series. Patient plans to get in 2023 since Medicare is covering now. [facility] in May 202306-Jun-2023 her primary care giver. He report he is adjusting well and has support from son, grandson and neighbors. He has been able to start walking with a group in his neighborhood and this social interaction has been positive.    Subjective: Bradley LANKFORD GUTZMER10 y.o. 51ar old male who is a primary patient of Paz, Jose E, Alda Bertholdhe CCM team was consulted for assistance with disease management and care coordination needs.    Engaged with patient by telephone for follow up visit in response to provider referral for pharmacy case management and/or care coordination services.   Consent to Services:  The patient was given information about Chronic Care Management services, agreed to services, and gave verbal consent prior to initiation of services.  Please see initial visit note for detailed documentation.   Patient Care  Team: Paz, JosColon BranchPCP - General Czinsky, JenniferStephani PolicePhUtahician Assistant (Orthopedic Surgery) Saullo, Zonia KiefConsulting Physician (Rehabilitation) Bell, EuLucas MallowConsulting Physician (Urology) HaverstoRenda RollsinJennefer BravoReferring Physician (Dermatology) Jorge Amparo, Cherre RobinsP (Pharmacist)  Recent office visits: 07/27/2021 - Int Med (Dr Paz) FolLarose Kells up chronic conditions. Checked labs. Vitamin D still low - recommended 50,000IU weekly for 3 months, then 2000IU daily. F/U 6 months.  03/12/2021 - PCP (Dr Wedling)Jone Basemanhesias in feet. Started duloxetine 20mg dai31mAlso noted TSH was elevated - T4 added and vitamin D low at 28 - added OTC vitamin D3 1000 IU daily.  Recent consult visits: 04/16/2021 - Physical Med and Rehab (Dr Saullo) sMaia Pettiesr sciatica and spinal stenosis   Hospital visits: None in previous 6 months  Objective:  Lab Results  Component Value Date   CREATININE 0.92 07/27/2021   CREATININE 0.95 01/18/2021   CREATININE 0.94 07/21/2020    Lab Results  Component Value Date   HGBA1C 6.2 07/27/2021   Last diabetic Eye exam: No results found for: "HMDIABEYEEXA"  Last diabetic Foot exam: No results found for: "HMDIABFOOTEX"      Component Value Date/Time   CHOL 110 07/27/2021 1609   TRIG 180.0 (H) 07/27/2021 1609   HDL 32.50 (L) 07/27/2021 1609   CHOLHDL 3 07/27/2021 1609   VLDL 36.0 07/27/2021 1609   LDLCALC 41 07/27/2021 1609   LDLDIRECT 74.0 07/21/2020 1329       Latest Ref Rng & Units 07/27/2021  4:09 PM 07/21/2020    1:29 PM 05/02/2019    9:12 AM  Hepatic Function  Total Protein 6.0 - 8.3 g/dL 6.4  6.7    Albumin 3.5 - 5.2 g/dL 4.2  4.3    AST 0 - 37 U/L 15  13  19    ALT 0 - 53 U/L 13  12  18    Alk Phosphatase 39 - 117 U/L 69  79    Total Bilirubin 0.2 - 1.2 mg/dL 0.9  0.8      Lab Results  Component Value Date/Time   TSH 4.28 07/27/2021 04:09 PM   TSH 5.06 (H) 03/12/2021 02:40 PM   FREET4 0.69 07/27/2021 04:09  PM       Latest Ref Rng & Units 01/18/2021    3:22 PM 12/31/2019    8:11 AM 03/21/2019    1:51 PM  CBC  WBC 4.0 - 10.5 K/uL 5.9  5.7  14.4   Hemoglobin 13.0 - 17.0 g/dL 13.1  13.9  13.3   Hematocrit 39.0 - 52.0 % 37.8  39.8  39.7   Platelets 150.0 - 400.0 K/uL 225.0  201.0  263.0     Lab Results  Component Value Date/Time   VD25OH 28.30 (L) 07/27/2021 04:09 PM   VD25OH 28 (L) 03/12/2021 02:40 PM    Clinical ASCVD: No  The ASCVD Risk score (Arnett DK, et al., 2019) failed to calculate for the following reasons:   The valid total cholesterol range is 130 to 320 mg/dL    Social History   Tobacco Use  Smoking Status Former  Smokeless Tobacco Never  Tobacco Comments   quit in the 90, light smoker    BP Readings from Last 3 Encounters:  07/27/21 (!) 144/84  03/12/21 118/68  01/18/21 122/68   Pulse Readings from Last 3 Encounters:  07/27/21 81  03/12/21 92  01/18/21 68   Wt Readings from Last 3 Encounters:  07/27/21 215 lb 2 oz (97.6 kg)  03/12/21 215 lb (97.5 kg)  01/18/21 218 lb 2 oz (98.9 kg)    Assessment: Review of patient past medical history, allergies, medications, health status, including review of consultants reports, laboratory and other test data, was performed as part of comprehensive evaluation and provision of chronic care management services.   SDOH:  (Social Determinants of Health) assessments and interventions performed:  SDOH Interventions    Flowsheet Row Most Recent Value  SDOH Interventions   Financial Strain Interventions Intervention Not Indicated  Physical Activity Interventions Intervention Not Indicated  Social Connections Interventions Intervention Not Indicated  Transportation Interventions Intervention Not Indicated        CCM Care Plan  Allergies  Allergen Reactions   Penicillins Swelling    redness diffusely 1962   Gabapentin Nausea Only    Medications Reviewed Today     Reviewed by Cherre Robins, RPH-CPP (Pharmacist) on  12/24/21 at 1440  Med List Status: <None>   Medication Order Taking? Sig Documenting Provider Last Dose Status Informant  atorvastatin (LIPITOR) 10 MG tablet [id number] Yes TAKE 1 TABLET BY MOUTH EVERYDAY AT BEDTIME Colon Branch, MD Taking Active   DULoxetine (CYMBALTA) 20 MG capsule [id number] No TAKE 1 CAPSULE BY MOUTH EVERY DAY  Patient not taking: Reported on 12/24/2021   Shelda Pal, DO Not Taking Active   losartan (COZAAR) 100 MG tablet [id number] Yes TAKE 1 TABLET BY MOUTH EVERY DAY Colon Branch, MD Taking Active  Med Note Antony Contras, Lynnet Hefley B   Fri Dec 24, 2021  2:37 PM) morning  morphine (MS CONTIN) 30 MG 12 hr tablet [id number] Yes Take 30 mg by mouth in the morning and at bedtime.  [provider] Taking Active            Med Note [name], Lindajo Royal Mar 16, 2017 10:26 AM)    Multiple Vitamin (MULITIVITAMIN WITH MINERALS) TABS [id number] Yes Take 1 tablet by mouth daily. [provider] Taking Active Multiple Informants  omeprazole (PRILOSEC) 20 MG capsule [id number] Yes Take 1 capsule (20 mg total) by mouth 2 (two) times daily.  Patient taking differently: Take 20 mg by mouth daily.   Tanna Furry, MD Taking Active            Med Note [facility], [facility] B   Fri Dec 24, 2021  2:38 PM) Takes over-the-counter omeprazole 31m  tamsulosin (FLOMAX) 0.4 MG CAPS capsule [id number] Take 0.4 mg by mouth daily. [provider] Taking Active   Vitamin D, Cholecalciferol, 25 MCG (1000 UT) CAPS [id number] Take 2,000 Units by mouth daily.  Patient not taking: Reported on 12/24/2021   [provider] Not Taking Active             Patient Active Problem List   Diagnosis Date Noted   SCC (squamous cell carcinoma) 02/28/2020   High cholesterol 10/30/2018   Diabetes ([place] 10/30/2018   Hypertension 04/24/2017   Annual physical exam 04/17/2015   PCP NOTES >>>> 04/17/2015   Chronic back pain 08/17/2010   HEADACHE 02/09/2010    Immunization  History  Administered Date(s) Administered   Fluad Quad(high Dose 65+) 05/02/2019, 03/26/2020   Influenza Split 03/30/2011, 03/28/2012   Influenza Whole 03/02/2010   Influenza, High Dose Seasonal PF 03/17/2015, 04/19/2016, 03/16/2017, 03/26/2018   Influenza,inj,Quad PF,6+ Mos 03/20/2013, 03/25/2014   Influenza-Unspecified 03/15/2021   PFIZER(Purple Top)SARS-COV-2 Vaccination 08/02/2019, 08/24/2019, 03/20/2020, 10/26/2020   Pfizer Covid-19 Vaccine Bivalent Booster 171yr& up 02/19/2021   Pneumococcal Conjugate-13 04/17/2015   Pneumococcal Polysaccharide-23 03/02/2010, 07/21/2020   Td 04/17/2015    Conditions to be addressed/monitored: HTN, HLD, and BPH, chronic back pain, elevated TSH; low vitamin D; type 2 DM; GERD  Care Plan : General Pharmacy (Adult)  Updates made by EcCherre RobinsRPH-CPP since 12/24/2021 12:00 AM     Problem:  Chronic Disease Management support, education, and care coordination needs related to Hypertension, Hyperlipidemia, Pre-Diabetes, GERD, Chronic Back Pain, BPH   Priority: Medium  Onset Date: 09/28/2020  Note:   Current Barriers:  Unable to maintain control of HTN Unable to achieve control of neuropathy Education regarding medication therapy  Pharmacist Clinical Goal(s):  Over the next 180 days, patient will achieve control of HTN as evidenced by BP <140/90  through collaboration with PharmD and provider.  Improve neuropathic pain.  Provided education and support for chronic conditions and medication therapy.  Interventions: 1:1 collaboration with PaColon BranchMD regarding development and update of comprehensive plan of care as evidenced by provider attestation and co-signature Inter-disciplinary care team collaboration (see longitudinal plan of care) Comprehensive medication review performed; medication list updated in electronic medical record  Pre- Diabetes / Diabetes: Controlled. A1c goal < 6.5% Current treatment:  no pharmacologic therapy Diet  only Current glucose readings: does not check at home.  Denies hypoglycemic/hyperglycemic symptoms Current meal patterns: no sodas and limits intake of sweets Current exercise: has started walking daily with group in his neighborhood.  Interventions:  Reviewed serving sizes and high CHO foods to limit If A1c >6.5% at next check, consider adding metformin ER 5329m daily  Great job with exercise! Continue to walk daily - goal is to get at least 150 minutes of exercise per week.  Hypertension: At goal; blood pressure goal <140/90   BP Readings from Last 3 Encounters:  07/27/21 (!) 144/84  03/12/21 118/68  01/18/21 122/68  Current treatment: losartan 103mdaily  Home blood pressure - has not checked recently Interventions:  Recommended continue current therapy. If blood pressure > 140/90 at next visit consider adding hydrochlorothiazide 12.29m72mo losartan. record blood pressure readings for review at future appts.  Continue to limit intake of sodium to <2300 mg daily   Hyperlipidemia, mixed: LDL at goal but Tg elevated but improved from 224 to 180 LDL goal <100 and Tg goal <150 Current treatment:  Atorvastatin 76m53m bedtime  Intervention:  Recommended continue current therapy Discussed continuing to limiting intake of sugar and carbohydrates to lower triglycerides  GERD:  Patient is currently controlled on the following medications:  Omeprazole 20mg529mce daily (over-the-counter)  Interventions:  Continue current medications  Chronic Back and nerve Pain:  Uncontrolled; patient states he continues to have daily neuropathy Current Regimen:  Morphine ER 30mg 64me a day Patient reports today that he feels pain in feet / nerves has not significantly improved. At last Chronic Care Management visit recommended he increase duloxetine to 40mg d48m but patient did not try higher dose and has stopped duloxetine altogether. He states he did not recall recommendation to increase  duloxetine.  He did try other recommendation from previous Chronic Care Management visit - capsaisin cream - states that it burned his feet / skin too bad. He had to wash it off shortly after application.  Interventions: Rx sent in for duloxetine 20mg - 629m 1 capsule daily for 3 days, then increase to 40mg = 274msules daily thereafter.  Patient has follow up with PCP in 1 month If duloxetine not effective, other options are: Venlafaxine 37.29mg daily38mtitrating to 150mg daily102mtriptyline 76mg at bed57m Pregabalin 229mg titrati31mo tid and up to 75-150mg daily  A74m lipoic acid 600mg daily Val70mc acid or carbamazepine.     BPH:  Followed by Dr Bell  Patient iGloriann Loanrrently controlled on the following medications:  Tamsulosin 0.4mg daily Overn71mt urination: once or less; Denies urinary hesitancy Recommend continue to follow up with urology   Health Maintenance:  Reviewed vaccination history; Due to have Shingrix series but has not gotten past due to cost Intervention:  Discussed getting Shingrix vaccine series. Patient plans to get in 2023 since Medicare is covering now. [facility] in May 2023. Patien06/16/23 his grandson visits 2 or 3 times per week. His son calls 3 times per week and patient has stated to meet with walking group in his neighborhood daily. Mr. Blaisdale reportMarlane Hatcher has been hard just he is adjusting.   Medication management After completing weekly Vitamin D 50,000 units patient was to start over-the-counter vitamin D 2000 units daily. Patient was not aware of change to over-the-counter vitamin D Interventions Comprehensive medication review performed. Reviewed refill history and assessed adherence Continue current medication management strategy Reminded patient to start over-the-counter vitamin D 2000 IU - take 1 tablet / capsules once a day       Medication Assistance: None  required.  Patient affirms current coverage  meets needs.  [id number] [place] [address]Phone: [phone]: [phone]  Follow Up:  Patient agrees to Care Plan and Follow-up.  Plan: Telephone follow up appointment with care management team member scheduled for:  2 to 3 months  TCherre Robins PharmD Clinical Pharmacist LCly[facility] HillMStratfordHAtlanticare [facility]

## 2021-12-24 NOTE — Patient Instructions (Signed)
Mr. Poole It was a pleasure speaking with you  Below is a summary of your health goals and care plan   Patient Goals over the Next 90 days:  Take medications as directed  Restart duloxetine '20mg'$  - take 1 capsule daily for 3 days, then increase to take '40mg'$  or 2 capsules daily thereafter. Start vitamin D 2000 units (over-the-counter) once daily  Continue to walk daily - great job! Goal is to get at least 150 minutes of exercise per week.  Check blood pressure at home 1 or 2 times per week. Record for future visit. Goal blood pressure is < 140/90   If you have any questions or concerns, please feel free to contact me either at the phone number below or with a MyChart message.   Keep up the good work!  Cherre Robins, PharmD Clinical Pharmacist [place] [phone] (direct line)  [phone] (main office number)   The patient verbalized understanding of instructions, educational materials, and care plan provided today and agreed to receive a mailed copy of patient instructions, educational materials, and care plan.

## 2021-12-31 DIAGNOSIS — M4807 Spinal stenosis, lumbosacral region: Secondary | ICD-10-CM | POA: Diagnosis not present

## 2021-12-31 DIAGNOSIS — M5431 Sciatica, right side: Secondary | ICD-10-CM | POA: Diagnosis not present

## 2022-01-15 DIAGNOSIS — L570 Actinic keratosis: Secondary | ICD-10-CM | POA: Diagnosis not present

## 2022-01-15 DIAGNOSIS — C44629 Squamous cell carcinoma of skin of left upper limb, including shoulder: Secondary | ICD-10-CM | POA: Diagnosis not present

## 2022-01-15 DIAGNOSIS — L578 Other skin changes due to chronic exposure to nonionizing radiation: Secondary | ICD-10-CM | POA: Diagnosis not present

## 2022-01-15 DIAGNOSIS — L821 Other seborrheic keratosis: Secondary | ICD-10-CM | POA: Diagnosis not present

## 2022-01-15 DIAGNOSIS — C4442 Squamous cell carcinoma of skin of scalp and neck: Secondary | ICD-10-CM | POA: Diagnosis not present

## 2022-01-17 ENCOUNTER — Other Ambulatory Visit: Payer: Self-pay | Admitting: Internal Medicine

## 2022-01-17 DIAGNOSIS — I10 Essential (primary) hypertension: Secondary | ICD-10-CM | POA: Diagnosis not present

## 2022-01-17 DIAGNOSIS — E7800 Pure hypercholesterolemia, unspecified: Secondary | ICD-10-CM | POA: Diagnosis not present

## 2022-01-17 DIAGNOSIS — E1169 Type 2 diabetes mellitus with other specified complication: Secondary | ICD-10-CM | POA: Diagnosis not present

## 2022-01-18 ENCOUNTER — Other Ambulatory Visit: Payer: Self-pay | Admitting: Internal Medicine

## 2022-01-18 DIAGNOSIS — R202 Paresthesia of skin: Secondary | ICD-10-CM

## 2022-01-24 ENCOUNTER — Ambulatory Visit (INDEPENDENT_AMBULATORY_CARE_PROVIDER_SITE_OTHER): Payer: PPO | Admitting: Internal Medicine

## 2022-01-24 ENCOUNTER — Encounter: Payer: Self-pay | Admitting: Internal Medicine

## 2022-01-24 VITALS — BP 132/68 | HR 56 | Temp 97.9°F | Resp 16 | Ht 70.0 in | Wt 214.5 lb

## 2022-01-24 DIAGNOSIS — E559 Vitamin D deficiency, unspecified: Secondary | ICD-10-CM | POA: Diagnosis not present

## 2022-01-24 DIAGNOSIS — I10 Essential (primary) hypertension: Secondary | ICD-10-CM

## 2022-01-24 DIAGNOSIS — F4321 Adjustment disorder with depressed mood: Secondary | ICD-10-CM | POA: Diagnosis not present

## 2022-01-24 DIAGNOSIS — E1169 Type 2 diabetes mellitus with other specified complication: Secondary | ICD-10-CM | POA: Diagnosis not present

## 2022-01-24 DIAGNOSIS — Z1159 Encounter for screening for other viral diseases: Secondary | ICD-10-CM | POA: Diagnosis not present

## 2022-01-24 DIAGNOSIS — E7800 Pure hypercholesterolemia, unspecified: Secondary | ICD-10-CM

## 2022-01-24 MED ORDER — SHINGRIX 50 MCG/0.5ML IM SUSR: 0.5000 mL | Freq: Once | INTRAMUSCULAR | 1 refills | Status: AC

## 2022-01-24 NOTE — Progress Notes (Unsigned)
Subjective:    Patient ID: Bradley Navarro, male    DOB: 1940/08/14, 81 y.o.   MRN: [id number]  DOS:  01/24/2022 Type of visit - description: Routine office visit Chronic medical problems well addressed. Also lost his wife 10/2021. Emotionally doing okay, as expected.  Denies chest pain or difficulty breathing No lower extremity edema No dysuria or gross hematuria No difficulty urinating.  They report constipation, sometimes cannot have a BM for 2 to 3 days.  MiraLAX does help. No nausea vomiting.  No blood in the stool  Review of Systems See above   Past Medical History:  Diagnosis Date   Chronic back pain    Elevated BP without diagnosis of hypertension 04/24/2017   History of headache    frontal lobe cluster headaches   History of kidney stones    Post laminectomy syndrome    SCC (squamous cell carcinoma) 01/2020   Sciatica of right side 2016   S/P L5-S1 revision lam/PSF on 04-10-12 w/ relief of RLE pain post op, MRI 01/2014 shows minimal foraminal stenosis at L4-5 w/o recurrent stenosis at L5-S1    Past Surgical History:  Procedure Laterality Date   [place] SURGERY  2012   L-5, Dr Vennie Homans   LUMBAR FUSION  2015   rod, plates   r arm surger      Current Outpatient Medications  Medication Instructions   atorvastatin (LIPITOR) 10 MG tablet TAKE 1 TABLET BY MOUTH EVERYDAY AT BEDTIME   DULoxetine (CYMBALTA) 40 mg, Oral, Daily   losartan (COZAAR) 100 MG tablet TAKE 1 TABLET BY MOUTH EVERY DAY   morphine (MS CONTIN) 30 mg, Oral, 2 times daily   Multiple Vitamin (MULITIVITAMIN WITH MINERALS) TABS 1 tablet, Daily   omeprazole (PRILOSEC) 20 mg, Oral, 2 times daily   tamsulosin (FLOMAX) 0.4 mg, Oral, Daily   Vitamin D (Cholecalciferol) 2,000 Units, Daily   Zoster Vaccine Adjuvanted (SHINGRIX) injection 0.5 mLs, Intramuscular,  Once       Objective:   Physical Exam BP 132/68   Pulse (!) 56   Temp 97.9 F (36.6 C) (Oral)   Resp 16   Ht '5\' 10"'$   (1.778 m)   Wt 214 lb 8 oz (97.3 kg)   SpO2 96%   BMI 30.78 kg/m  General:   Well developed, NAD, BMI noted.  HEENT:  Normocephalic . Face symmetric, atraumatic Lungs:  CTA B Normal respiratory effort, no intercostal retractions, no accessory muscle use. Heart: RRR,  no murmur.  Abdomen:  Not distended, soft, non-tender. No rebound or rigidity.   Skin: Not pale. Not jaundice Lower extremities: no pretibial edema bilaterally  Neurologic:  alert & oriented X3.  Speech normal, gait appropriate for age and unassisted Psych--  Cognition and judgment appear intact.  Cooperative with normal attention span and concentration.  Behavior appropriate. No anxious or depressed appearing.     Assessment     Assessment DM (A1c increased to 6.9 on 07-2020) DM neuropathy HTN High cholesterol h/o HA-- s/p extensive w/u in the past. Has seen Dr Maureen Chatters and neuro @ [facility]   h/o kidney stones , multiple Chronic back pain-- sees pain management, see surgeries Chronic hoarseness: Never seen by ENT Sees dermatology SCC Abnormal EKG: TWI, RBBB, see note from 05-2018 BPH, LUTS: Saw urology, last visit 2020.   PLAN BMP, CBC, A1c hep C vitamin D  DM: Last A1c 6.2, diet controlled, doing well.  Recheck A1c HTN:BP today is very good,  continue losartan.  Checking BMP. Neuropathy: Reports that paresthesias got even better after Cymbalta dose increased.  We talk about continue the same versus increased dose we elected no change. Low vitamin D, Rx ergocalciferol, recommended OTC vitamin D 2000 units daily Constipation: This have been an issue for a while, worse since Cymbalta dose increased.  Overall likely multifactorial because he does take pain medicine. Plan: Good hydration, start Metamucil capsules, continue MiraLAX, okay to take daily if needed.  Avoid calcium supplements, okay vitamin D. Grieving: Lost his wife 10/2021, he seems to be grieving normally.  Support provided. RTC CPX  07-2022  Here for CPX: DM: Diet controlled.  Has developed neuropathy.  Check A1c Neuropathy: Saw Dr. Nani Ravens few months ago with paresthesias, TSH was slightly elevated, vitamin D low.  He is currently taking OTC vitamin D.  Started Cymbalta.  Symptoms are slightly better.  Exam is confirmatory of neuropathy, likely from diabetes, feet care discussed. HTN: Continue losartan, BP today is 144/84, he had a somewhat difficult day at home, wife is in hospice.  Ambulatory BPs are typically normal.  No change High cholesterol: On Lipitor, checking labs. Vitamin D deficiency: On OTCs.  Checking levels Subclinical hypothyroidism: Slight increased TSH, checking labs. RTC 6 months next

## 2022-01-24 NOTE — Patient Instructions (Addendum)
For constipation: Metamucil capsules: 1 capsule daily You can use MiraLAX up to 1 time a day If you are taking any calcium supplements recommend to stop. Okay to take vitamin D supplements.  Recommend to proceed with the following vaccines at your pharmacy:  Shingrix (shingles) Flu shot this fall  Per our records you are due for your diabetic eye exam. Please contact your eye doctor to schedule an appointment. Please have them send copies of your office visit notes to [place]. Our fax number is [phone]. If you need a referral to an eye doctor please let us know.    GO TO THE LAB : Get the blood work     Bradley Navarro, PLEASE SCHEDULE YOUR APPOINTMENTS Come back for a physical exam by 07-2022

## 2022-01-25 DIAGNOSIS — D044 Carcinoma in situ of skin of scalp and neck: Secondary | ICD-10-CM | POA: Diagnosis not present

## 2022-01-25 DIAGNOSIS — D0462 Carcinoma in situ of skin of left upper limb, including shoulder: Secondary | ICD-10-CM | POA: Diagnosis not present

## 2022-01-25 LAB — CBC WITH DIFFERENTIAL/PLATELET
Basophils Absolute: 0 10*3/uL (ref 0.0–0.1)
Basophils Relative: 0.8 % (ref 0.0–3.0)
Eosinophils Absolute: 0.2 10*3/uL (ref 0.0–0.7)
Eosinophils Relative: 2.8 % (ref 0.0–5.0)
HCT: 40.2 % (ref 39.0–52.0)
Hemoglobin: 13.9 g/dL (ref 13.0–17.0)
Lymphocytes Relative: 36.2 % (ref 12.0–46.0)
Lymphs Abs: 2.1 10*3/uL (ref 0.7–4.0)
MCHC: 34.5 g/dL (ref 30.0–36.0)
MCV: 93.3 fl (ref 78.0–100.0)
Monocytes Absolute: 0.4 10*3/uL (ref 0.1–1.0)
Monocytes Relative: 7.4 % (ref 3.0–12.0)
Neutro Abs: 3.1 10*3/uL (ref 1.4–7.7)
Neutrophils Relative %: 52.8 % (ref 43.0–77.0)
Platelets: 204 10*3/uL (ref 150.0–400.0)
RBC: 4.31 Mil/uL (ref 4.22–5.81)
RDW: 12.7 % (ref 11.5–15.5)
WBC: 5.9 10*3/uL (ref 4.0–10.5)

## 2022-01-25 LAB — BASIC METABOLIC PANEL
BUN: 22 mg/dL (ref 6–23)
CO2: 27 mEq/L (ref 19–32)
Calcium: 9.1 mg/dL (ref 8.4–10.5)
Chloride: 101 mEq/L (ref 96–112)
Creatinine, Ser: 1.1 mg/dL (ref 0.40–1.50)
GFR: 63.66 mL/min (ref >60.00)
Glucose, Bld: 142 mg/dL — ABNORMAL HIGH (ref 70–99)
Potassium: 4.5 mEq/L (ref 3.5–5.1)
Sodium: 142 mEq/L (ref 135–145)

## 2022-01-25 LAB — VITAMIN D 25 HYDROXY (VIT D DEFICIENCY, FRACTURES): VITD: 31.17 ng/mL (ref 30.00–100.00)

## 2022-01-25 LAB — HEPATITIS C ANTIBODY: Hepatitis C Ab: NONREACTIVE

## 2022-01-25 LAB — HEMOGLOBIN A1C: Hgb A1c MFr Bld: 6.3 % (ref 4.6–6.5)

## 2022-01-25 NOTE — Assessment & Plan Note (Signed)
Routine visit DM: Last A1c 6.2, diet controlled, doing well.  Recheck A1c HTN:BP today is very good, continue losartan.  Checking BMP. Neuropathy: Reports that paresthesias got even better after Cymbalta dose increased.  We talk about continue the same versus increased dose we elected no change. Low vitamin D, Rx ergocalciferol, recommended OTC vitamin D 2000 units daily Constipation: This have been an issue for a while, worse since Cymbalta dose increased.  Likely multifactorial because he does take pain medicine. Plan: Good hydration, start Metamucil capsules, continue MiraLAX, okay to take daily if needed.  Avoid calcium supplements(but ok Vit D). Grieving: Lost his wife 10/2021, he seems to be grieving normally.  Support provided. RTC CPX 07-2022

## 2022-02-09 ENCOUNTER — Encounter: Payer: Self-pay | Admitting: Internal Medicine

## 2022-02-18 DIAGNOSIS — M4807 Spinal stenosis, lumbosacral region: Secondary | ICD-10-CM | POA: Diagnosis not present

## 2022-02-18 DIAGNOSIS — M5431 Sciatica, right side: Secondary | ICD-10-CM | POA: Diagnosis not present

## 2022-03-03 ENCOUNTER — Ambulatory Visit (INDEPENDENT_AMBULATORY_CARE_PROVIDER_SITE_OTHER): Payer: PPO | Admitting: Pharmacist

## 2022-03-03 DIAGNOSIS — R202 Paresthesia of skin: Secondary | ICD-10-CM

## 2022-03-03 DIAGNOSIS — E559 Vitamin D deficiency, unspecified: Secondary | ICD-10-CM

## 2022-03-03 DIAGNOSIS — I10 Essential (primary) hypertension: Secondary | ICD-10-CM

## 2022-03-03 NOTE — Patient Instructions (Signed)
Bradley Navarro It was a pleasure speaking with you today.  Below is a summary of your health goals and summary of our recent visit. You can also view your updated Chronic Care Management Care plan through your MyChart account.   Patient Goals over the Next 90 days:  Take medications as directed  Checking with Dr Larose Kells about possible increase in duloxetine to '60mg'$  daily  Continue vitamin D 2000 units (over-the-counter) once daily  Continue to walk daily - great job! Goal is to get at least 150 minutes of exercise per week.  Check blood pressure at home 1 or 2 times per week. Record for future visit. Goal blood pressure is < 140/90   As always if you have any questions or concerns especially regarding medications, please feel free to contact me either at the phone number below or with a MyChart message.   Keep up the good work!  Cherre Robins, PharmD Clinical Pharmacist [place] [phone] (direct line)  [phone] (main office number)   The patient verbalized understanding of instructions, educational materials, and care plan provided today and DECLINED offer to receive copy of patient instructions, educational materials, and care plan.

## 2022-03-03 NOTE — Chronic Care Management (AMB) (Signed)
Chronic Care Management Pharmacy Note  03/03/2022 Name:  Bradley Navarro MRN:  [id number] DOB:  Mar 09, 1941  Summary: Patient reports he started duloxetine 35m daily and initially relieved neuropathy well but has been less effective recently. Will reach out to provider to see if OK to increase to 668mdaily. Patient is express that he is worried that higher dose of duloxetine could cause constipation. He is taking Fiber tablet daily and Miralax every other day currently with good results. If has increase in constipation with change in duloxetine dose, then can increase Miralax to daily.   Patients wife died in [id number] was her primary care giver. He report he is adjusting well and has support from son, grandson and neighbors. He has been able to start walking with a group in his neighborhood every day for 60 minutes   Subjective: Bradley JURGENSs an [age].o. year old male who is a primary patient of Paz, JoAlda BertholdMD.  The CCM team was consulted for assistance with disease management and care coordination needs.    Engaged with patient by telephone for follow up visit in response to provider referral for pharmacy case management and/or care coordination services.   Consent to Services:  The patient was given information about Chronic Care Management services, agreed to services, and gave verbal consent prior to initiation of services.  Please see initial visit note for detailed documentation.   Patient Care Team: PaColon BranchMD as PCP - General Czinsky, JeStephani PolicePAUtahs Physician Assistant (Orthopedic Surgery) SaZonia KiefMD as Consulting Physician (Rehabilitation) BeLucas MallowMD as Consulting Physician (Urology) HaRenda RollsChJennefer BravoMD as Referring Physician (Dermatology) EcCherre RobinsRPH-CPP (Pharmacist)  Recent office visits: 01/24/2022 - Int Med (Dr PaLarose KellsF/U chronic conditions.  07/27/2021 - Int Med (Dr PaLarose KellsFollow up chronic conditions. Checked  labs. Vitamin D still low - recommended 50,000IU weekly for 3 months, then 2000IU daily. F/U 6 months.  03/12/2021 - PCP (Dr WeJone BasemanParesthesias in feet. Started duloxetine 2023maily. Also noted TSH was elevated - T4 added and vitamin D low at 28 - added OTC vitamin D3 1000 IU daily.  Recent consult visits: 01/15/2022 - Dermatology (Dr GeoLeda Roys07/14/2023 - Phy med and Rehab (Dr SauMaia Pettieseen for spinal stenosis.  04/16/2021 - Physical Med and Rehab (Dr SauMaia Pettieseen for sciatica and spinal stenosis   Hospital visits: None in previous 6 months  Objective:  Lab Results  Component Value Date   CREATININE 1.10 01/24/2022   CREATININE 0.92 07/27/2021   CREATININE 0.95 01/18/2021    Lab Results  Component Value Date   HGBA1C 6.3 01/24/2022   Last diabetic Eye exam: No results found for: "HMDIABEYEEXA"  Last diabetic Foot exam: No results found for: "HMDIABFOOTEX"      Component Value Date/Time   CHOL 110 07/27/2021 1609   TRIG 180.0 (H) 07/27/2021 1609   HDL 32.50 (L) 07/27/2021 1609   CHOLHDL 3 07/27/2021 1609   VLDL 36.0 07/27/2021 1609   LDLCALC 41 07/27/2021 1609   LDLDIRECT 74.0 07/21/2020 1329       Latest Ref Rng & Units 07/27/2021    4:09 PM 07/21/2020    1:29 PM 05/02/2019    9:12 AM  Hepatic Function  Total Protein 6.0 - 8.3 g/dL 6.4  6.7    Albumin 3.5 - 5.2 g/dL 4.2  4.3    AST 0 - 37 U/L 15  13  19  ALT 0 - 53 U/L 13  12  18    Alk Phosphatase 39 - 117 U/L 69  79    Total Bilirubin 0.2 - 1.2 mg/dL 0.9  0.8      Lab Results  Component Value Date/Time   TSH 4.28 07/27/2021 04:09 PM   TSH 5.06 (H) 03/12/2021 02:40 PM   FREET4 0.69 07/27/2021 04:09 PM       Latest Ref Rng & Units 01/24/2022    4:06 PM 01/18/2021    3:22 PM 12/31/2019    8:11 AM  CBC  WBC 4.0 - 10.5 K/uL 5.9  5.9  5.7   Hemoglobin 13.0 - 17.0 g/dL 13.9  13.1  13.9   Hematocrit 39.0 - 52.0 % 40.2  37.8  39.8   Platelets 150.0 - 400.0 K/uL 204.0  225.0  201.0     Lab Results   Component Value Date/Time   VD25OH 31.17 01/24/2022 04:06 PM   VD25OH 28.30 (L) 07/27/2021 04:09 PM    Clinical ASCVD: No  The ASCVD Risk score (Arnett DK, et al., 2019) failed to calculate for the following reasons:   The 2019 ASCVD risk score is only valid for ages 102 to 68    Social History   Tobacco Use  Smoking Status Former  Smokeless Tobacco Never  Tobacco Comments   quit in the 90, light smoker    BP Readings from Last 3 Encounters:  01/24/22 132/68  07/27/21 (!) 144/84  03/12/21 118/68   Pulse Readings from Last 3 Encounters:  01/24/22 (!) 56  07/27/21 81  03/12/21 92   Wt Readings from Last 3 Encounters:  01/24/22 214 lb 8 oz (97.3 kg)  07/27/21 215 lb 2 oz (97.6 kg)  03/12/21 215 lb (97.5 kg)    Assessment: Review of patient past medical history, allergies, medications, health status, including review of consultants reports, laboratory and other test data, was performed as part of comprehensive evaluation and provision of chronic care management services.   SDOH:  (Social Determinants of Health) assessments and interventions performed:  SDOH Interventions    Flowsheet Row Chronic Care Management from 03/03/2022 in [facility] at [place] Management from 12/24/2021 in [place] at [place] Management from 06/25/2021 in [place] [facility] [place] Management from 03/23/2021 in [place] at [place] Management from 09/28/2020 in [facility] at [place] Visit from 07/21/2020 in [place] at [place] Interventions        Transportation Interventions -- Intervention Not Indicated -- -- -- --  Depression Interventions/Treatment  -- -- -- -- -- Currently on Treatment  Financial Strain Interventions -- Intervention Not Indicated  Intervention Not Indicated Intervention Not Indicated -- --  Physical Activity Interventions Intervention Not Indicated Intervention Not Indicated -- Patient Bradley Navarro for wife / does housework / has chronic back pain] Patient Refused  [patient states he gets enough physical activity caring for his wife and doing housework] --  Social Connections Interventions -- Intervention Not Indicated -- -- -- --        CCM Care Plan  Allergies  Allergen Reactions   Penicillins Swelling    redness diffusely 1962   Gabapentin Nausea Only    Medications Reviewed Today     Reviewed by Cherre Robins, RPH-CPP (Pharmacist) on 03/03/22 at [facility]  List Status: <None>   Medication Order Taking? Sig Documenting Provider Last Dose Status Informant  atorvastatin (LIPITOR) 10 MG tablet [id number] Yes TAKE 1 TABLET BY MOUTH EVERYDAY AT BEDTIME Colon Branch, MD Taking Active   Calcium Polycarbophil (FIBER-CAPS PO) [id number] Yes Take 1 capsule by mouth daily. [provider] Taking Active   DULoxetine (CYMBALTA) 20 MG capsule [id number] Yes Take 2 capsules (40 mg total) by mouth daily. Colon Branch, MD Taking Active   losartan (COZAAR) 100 MG tablet [id number] Yes TAKE 1 TABLET BY MOUTH EVERY DAY Colon Branch, MD Taking Active   morphine (MS CONTIN) 30 MG 12 hr tablet [id number] Yes Take 30 mg by mouth in the morning and at bedtime.  [provider] Taking Active            Med Note [name], [place] Mar 16, 2017 10:26 AM)    Multiple Vitamin (MULITIVITAMIN WITH MINERALS) TABS [id number] Yes Take 1 tablet by mouth daily. [provider] Taking Active Multiple Informants  omeprazole (PRILOSEC) 20 MG capsule [id number] Yes Take 1 capsule (20 mg total) by mouth 2 (two) times daily.  Patient taking differently: Take 20 mg by mouth daily.   Tanna Furry, MD Taking Active            Med Note [facility], [facility]   Fri Dec 24, 2021  2:38 PM) Takes over-the-counter omeprazole 72m   polyethylene glycol (MIRALAX / GLYCOLAX) 17 g packet [id number] Take 17 g by mouth every other day. [provider] Taking Active   tamsulosin (FLOMAX) 0.4 MG CAPS capsule [id number] Take 0.4 mg by mouth daily. [provider] Taking Active   Vitamin D, Cholecalciferol, 25 MCG (1000 UT) CAPS [id number] Take 2,000 Units by mouth daily. [provider] Taking Active             Patient Active Problem List   Diagnosis Date Noted   SCC (squamous cell carcinoma) 02/28/2020   High cholesterol 10/30/2018   Diabetes ([place] 10/30/2018   Hypertension 04/24/2017   Annual physical exam 04/17/2015   PCP NOTES >>>> 04/17/2015   Chronic back pain 08/17/2010   HEADACHE 02/09/2010    Immunization History  Administered Date(s) Administered   Fluad Quad(high Dose 65+) 05/02/2019, 03/26/2020   Influenza Split 03/30/2011, 03/28/2012   Influenza Whole 03/02/2010   Influenza, High Dose Seasonal PF 03/17/2015, 04/19/2016, 03/16/2017, 03/26/2018   Influenza,inj,Quad PF,6+ Mos 03/20/2013, 03/25/2014   Influenza-Unspecified 03/15/2021   PFIZER(Purple Top)SARS-COV-2 Vaccination 08/02/2019, 08/24/2019, 03/20/2020, 10/26/2020   Pfizer Covid-19 Vaccine Bivalent Booster 141yr& up 02/19/2021   Pneumococcal Conjugate-13 04/17/2015   Pneumococcal Polysaccharide-23 03/02/2010, 07/21/2020   Td 04/17/2015    Conditions to be addressed/monitored: HTN, HLD, and BPH, chronic back pain, elevated TSH; low vitamin D; type 2 DM; GERD  Care Plan : General Pharmacy (Adult)  Updates made by EcCherre RobinsRPH-CPP since 03/03/2022 12:00 AM     Problem:  Chronic Disease Management support, education, and care coordination needs related to Hypertension, Hyperlipidemia, Pre-Diabetes, GERD, Chronic Back Pain, BPH   Priority: Medium  Onset Date: 09/28/2020  Note:   Current Barriers:  Unable to maintain control of HTN Unable to achieve control of neuropathy Education regarding  medication therapy  Pharmacist Clinical Goal(s):  Over the next 180 days, patient will achieve control of HTN as evidenced by BP <140/90  through collaboration with PharmD and provider.  Improve neuropathic pain.  Provided education and support for chronic  conditions and medication therapy.  Interventions: 1:1 collaboration with Colon Branch, MD regarding development and update of comprehensive plan of care as evidenced by provider attestation and co-signature Inter-disciplinary care team collaboration (see longitudinal plan of care) Comprehensive medication review performed; medication list updated in electronic medical record  Pre- Diabetes / Diabetes: Controlled. A1c goal < 6.5% Current treatment:  no pharmacologic therapy Diet only Current glucose readings: does not check at home.  Denies hypoglycemic/hyperglycemic symptoms Current meal patterns: no sodas and limits intake of sweets Current exercise: has started walking daily with group in his neighborhood.  Interventions:  Reviewed serving sizes and high CHO foods to limit If A1c >6.5% at next check, consider adding metformin ER 566m daily  Great job with exercise! Continue to walk daily - goal is to get at least 150 minutes of exercise per week.  Hypertension: At goal; blood pressure goal <140/90   BP Readings from Last 3 Encounters:  07/27/21 (!) 144/84  03/12/21 118/68  01/18/21 122/68  Current treatment: losartan 1058mdaily  Home blood pressure - has not checked recently Interventions:  Recommended continue current therapy. If blood pressure > 140/90 at next visit consider adding hydrochlorothiazide 12.22m31mo losartan. record blood pressure readings for review at future appts.  Continue to limit intake of sodium to <2300 mg daily   Hyperlipidemia, mixed: LDL at goal but Tg elevated but improved from 224 to 180 LDL goal <100 and Tg goal <150 Current treatment:  Atorvastatin 36m68m bedtime  Intervention:   Recommended continue current therapy Discussed continuing to limiting intake of sugar and carbohydrates to lower triglycerides  GERD:  Patient is currently controlled on the following medications:  Omeprazole 20mg60mce daily (over-the-counter)  Interventions:  Continue current medications  Chronic Back and nerve Pain:  Uncontrolled; patient states he continues to have daily neuropathy Current Regimen:  Morphine ER 30mg 36me a day Duloxetine 40mg d522m  Interventions: Consider duloxetine 60mg da51m- checking with provider to see if OK to increase to 60mg Pat13m has follow up with PCP in 1 month If duloxetine not effective, other options are: Venlafaxine 37.22mg daily18mtitrating to 150mg daily66mtriptyline 36mg at bed90m Pregabalin 222mg titrati56mo tid and up to 75-150mg daily  A50m lipoic acid 600mg daily Val67mc acid or carbamazepine.     BPH:  Followed by Dr Bell  Patient iGloriann Loanrrently controlled on the following medications:  Tamsulosin 0.4mg daily Overn7mt urination: once or less; Denies urinary hesitancy Recommend continue to follow up with urology   Health Maintenance:  Discussed getting annual flu vaccine and COVID booster  Medication management Interventions Comprehensive medication review performed. Reviewed refill history and assessed adherence Continue current medication management strategy Continue vitamin D 2000 IU - take 1 tablet / capsules once a day       Medication Assistance: None required.  Patient affirms current coverage meets needs.  Patient's preferred pharmacy is:  [id number] [id number] Fax:[phone]   [id number] agrees to Care Plan and Follow-up.  Plan: Telephone follow up appointment with care management team member scheduled for:  2 to 3 months  Bartley Vuolo, PhaCherre Robins Pharmacist [facility] - PCP OK'd increasing duloxetine to 60mg daily. Updat64mrescription set to pharmacy and patient notified.

## 2022-03-10 MED ORDER — DULOXETINE HCL 60 MG PO CPEP: 60.0000 mg | ORAL_CAPSULE | Freq: Every day | ORAL | 1 refills | Status: DC

## 2022-03-10 NOTE — Addendum Note (Signed)
Addended by: Cherre Robins B on: 03/10/2022 11:47 AM   Modules accepted: Orders

## 2022-03-14 ENCOUNTER — Telehealth: Payer: Self-pay | Admitting: Internal Medicine

## 2022-03-14 MED ORDER — PREGABALIN 25 MG PO CAPS: 25.0000 mg | ORAL_CAPSULE | Freq: Every day | ORAL | 2 refills | Status: DC

## 2022-03-14 NOTE — Telephone Encounter (Signed)
Spoke w/ Pt- informed of recommendations. He agreed to try Lyrica. He never picked up the Cymbalta '60mg'$  capsules- he has '20mg'$  at home, I gave him instructions on how to dose down. 6 week f/u scheduled 04/25/22.

## 2022-03-14 NOTE — Telephone Encounter (Signed)
Please advise 

## 2022-03-14 NOTE — Telephone Encounter (Signed)
Recommend to decrease dose to Cymbalta 20 mg: 2 tablets daily. Send a prescription if needed. Let us know if dizziness continue. Seek medical care if dizziness is severe or unusual

## 2022-03-14 NOTE — Telephone Encounter (Signed)
Spoke w/ Pt- informed of recommendations. He would like to try something other than Cymbalta- stated that PCP mentioned another medication at last visit that would help w/ neuropathy and that wouldn't constipate him?

## 2022-03-14 NOTE — Telephone Encounter (Signed)
PDMP okay, Rx sent 

## 2022-03-14 NOTE — Telephone Encounter (Signed)
-  Another option would be Lyrica 25 mg at bedtime.  (It is related to gabapentin but hopefully it will not cause any side effects) - If the patient is willing to try, send the 68-monthsupply. -Arrange office visit in 6 weeks -If he likes to stop Cymbalta, he is taking 60 mg so needs to do so slowly. Send  Cymbalta 20 mg : 2 capsules qd  for 1 week, then 1 capsule qd  x 1 week, then stop. (No worries if he is already stopped Cymbalta)

## 2022-03-14 NOTE — Telephone Encounter (Signed)
Patient states ever since he started taling the Cymbalta '60mg'$  he has been feeling shaky and dizzy. He would like to know what to do. Please advise.

## 2022-03-16 ENCOUNTER — Ambulatory Visit: Payer: PPO

## 2022-03-19 DIAGNOSIS — E785 Hyperlipidemia, unspecified: Secondary | ICD-10-CM

## 2022-03-19 DIAGNOSIS — I10 Essential (primary) hypertension: Secondary | ICD-10-CM | POA: Diagnosis not present

## 2022-03-19 DIAGNOSIS — N400 Enlarged prostate without lower urinary tract symptoms: Secondary | ICD-10-CM | POA: Diagnosis not present

## 2022-04-20 ENCOUNTER — Telehealth: Payer: Self-pay | Admitting: Internal Medicine

## 2022-04-20 MED ORDER — PREGABALIN 50 MG PO CAPS: 50.0000 mg | ORAL_CAPSULE | Freq: Every day | ORAL | 3 refills | Status: DC

## 2022-04-20 NOTE — Telephone Encounter (Signed)
Spoke w/ Pt- he informed me that he has several capsules of Lyrica left but didn't know if he should refill it- he informed me that he felt Lyrica '25mg'$  isn't helping with his feet, he is currently taking 1 capsule by mouth at bedtime. It does not make him to drowsy or sleepy. He is requesting recommendations.

## 2022-04-20 NOTE — Telephone Encounter (Signed)
Pt called stating his meds for neuropathy are not working and wanted to speak with [facility] about it. Vilma Prader was unavailable at the time and advised a note would be routed back to look into this for him. Pt acknowledged understanding.

## 2022-04-20 NOTE — Telephone Encounter (Signed)
Spoke w/ Pt- informed informed of recommendations. Pt verbalized understanding. He has a follow-up appt on 04/25/22.

## 2022-04-20 NOTE — Telephone Encounter (Signed)
See phone note from 03/14/2022, previously on Cymbalta, at some point it was helping, he was switched to Lyrica. Plan: Increase Lyrica to 50 mg nightly, prescription sent. To let me know in 2 to 3 weeks if that is providing some benefit. If he is not better we could increase Lyrica dose or go back on Cymbalta.

## 2022-04-21 ENCOUNTER — Other Ambulatory Visit: Payer: Self-pay | Admitting: Internal Medicine

## 2022-04-21 DIAGNOSIS — R202 Paresthesia of skin: Secondary | ICD-10-CM

## 2022-04-22 DIAGNOSIS — G894 Chronic pain syndrome: Secondary | ICD-10-CM | POA: Diagnosis not present

## 2022-04-22 DIAGNOSIS — M4807 Spinal stenosis, lumbosacral region: Secondary | ICD-10-CM | POA: Diagnosis not present

## 2022-04-22 DIAGNOSIS — Z79899 Other long term (current) drug therapy: Secondary | ICD-10-CM | POA: Diagnosis not present

## 2022-04-22 DIAGNOSIS — Z79891 Long term (current) use of opiate analgesic: Secondary | ICD-10-CM | POA: Diagnosis not present

## 2022-04-22 DIAGNOSIS — M5431 Sciatica, right side: Secondary | ICD-10-CM | POA: Diagnosis not present

## 2022-04-25 ENCOUNTER — Encounter: Payer: Self-pay | Admitting: Internal Medicine

## 2022-04-25 ENCOUNTER — Ambulatory Visit (INDEPENDENT_AMBULATORY_CARE_PROVIDER_SITE_OTHER): Payer: PPO | Admitting: Internal Medicine

## 2022-04-25 VITALS — BP 130/68 | HR 81 | Temp 97.7°F | Resp 18 | Ht 70.0 in | Wt 224.1 lb

## 2022-04-25 DIAGNOSIS — G629 Polyneuropathy, unspecified: Secondary | ICD-10-CM | POA: Diagnosis not present

## 2022-04-25 DIAGNOSIS — E559 Vitamin D deficiency, unspecified: Secondary | ICD-10-CM

## 2022-04-25 LAB — FOLATE: Folate: >23.8 ng/mL (ref >5.9)

## 2022-04-25 LAB — VITAMIN D 25 HYDROXY (VIT D DEFICIENCY, FRACTURES): VITD: 39.03 ng/mL (ref 30.00–100.00)

## 2022-04-25 NOTE — Patient Instructions (Addendum)
In 2 weeks, let me know how your neuropathy symptoms are.  We can always adjust your Lyrica dose.  GO TO THE LAB : Get the blood work    Your next appointment is scheduled for February, come back sooner if needed.     Per our records you are due for your diabetic eye exam. Please contact your eye doctor to schedule an appointment. Please have them send copies of your office visit notes to [place]. Our fax number is [phone]. If you need a referral to an eye doctor please let us know.

## 2022-04-25 NOTE — Progress Notes (Unsigned)
Subjective:    Patient ID: Bradley Navarro, male    DOB: 1941/03/06, 81 y.o.   MRN: [id number]  DOS:  04/25/2022 Type of visit - description: To talk about neuropathy  Having symptoms of neuropathy for years, described as numbness, tingling, sharp pains on and off at both legs. Symptoms are worse during the daytime. Denies claudication, no bladder or bowel incontinence. Has chronic back pain, at baseline.   Review of Systems See above   Past Medical History:  Diagnosis Date   Chronic back pain    Elevated BP without diagnosis of hypertension 04/24/2017   History of headache    frontal lobe cluster headaches   History of kidney stones    Post laminectomy syndrome    SCC (squamous cell carcinoma) 01/2020   Sciatica of right side 2016   S/P L5-S1 revision lam/PSF on 04-10-12 w/ relief of RLE pain post op, MRI 01/2014 shows minimal foraminal stenosis at L4-5 w/o recurrent stenosis at L5-S1    Past Surgical History:  Procedure Laterality Date   [place] SURGERY  2012   L-5, Dr Vennie Homans   LUMBAR FUSION  2015   rod, plates   r arm surger      Current Outpatient Medications  Medication Instructions   atorvastatin (LIPITOR) 10 MG tablet TAKE 1 TABLET BY MOUTH EVERYDAY AT BEDTIME   Calcium Polycarbophil (FIBER-CAPS PO) 1 capsule, Oral, Daily   losartan (COZAAR) 100 MG tablet TAKE 1 TABLET BY MOUTH EVERY DAY   morphine (MS CONTIN) 30 mg, Oral, 2 times daily   Multiple Vitamin (MULITIVITAMIN WITH MINERALS) TABS 1 tablet, Daily   omeprazole (PRILOSEC) 20 mg, Oral, 2 times daily   polyethylene glycol (MIRALAX / GLYCOLAX) 17 g, Oral, Every other day   pregabalin (LYRICA) 50 mg, Oral, Daily at bedtime   tamsulosin (FLOMAX) 0.4 mg, Oral, Daily   Vitamin D (Cholecalciferol) 2,000 Units, Oral, Daily       Objective:   Physical Exam BP 130/68   Pulse 81   Temp 97.7 F (36.5 C) (Oral)   Resp 18   Ht '5\' 10"'$  (1.778 m)   Wt 224 lb 2 oz (101.7 kg)   SpO2 95%    BMI 32.16 kg/m  General:   Well developed, NAD, BMI noted. HEENT:  Normocephalic . Face symmetric, atraumatic Lower extremities: no pretibial edema bilaterally. Good pedal pulses, good capillary refills, feet are warm. Pinprick examination: Decreased in a sock distribution symmetrically, from the distal third of the pretibial area down. Skin: Not pale. Not jaundice Neurologic:  alert & oriented X3.  Speech normal, gait appropriate for age and unassisted Psych--  Cognition and judgment appear intact.  Cooperative with normal attention span and concentration.  Behavior appropriate. No anxious or depressed appearing.      Assessment     Assessment DM (A1c increased to 6.9 on 07-2020) DM neuropathy HTN High cholesterol h/o HA-- s/p extensive w/u in the past. Has seen Dr Maureen Chatters and neuro @ [facility]   h/o kidney stones , multiple Chronic back pain-- sees pain management, see surgeries Chronic hoarseness: Never seen by ENT Sees dermatology SCC Abnormal EKG: TWI, RBBB, see note from 05-2018 BPH, LUTS: Saw urology, last visit 2020.   PLAN Neuropathy: Here for further evaluation. Has neuropathy symptoms for years, felt to be due to DM, see HPI.  Physical exam which is confirmatory.  Since the last visit, Cymbalta dose increase, he had side effects, I wean him  off Cymbalta and started Lyrica. Last week, Lyrica dose increased to 50 mg. Plan: He will call me in a couple of weeks and let me know how Lyrica 50 mg is doing.  Consider increased dose. Additional labs today: Recheck vitamin D, homocystine 11, folic acid and SPEP. Not sure if a NCS has been done, we will reach out to Dr. Maia Petties office. Follow-up scheduled for February 2024

## 2022-04-26 NOTE — Assessment & Plan Note (Signed)
Neuropathy: Here for further evaluation. Has neuropathy symptoms for years, felt to be due to DM, see HPI.  Physical exam which is confirmatory.  Since the last visit, Cymbalta dose increase, he had side effects, I wean him off Cymbalta and started Lyrica. Last week, Lyrica dose increased to 50 mg. Plan: He will call me in a couple of weeks and let me know how Lyrica 50 mg is doing.  Consider increased dose. Additional labs today: Recheck vitamin D, homocystine 11, folic acid and SPEP. Not sure if a NCS has been done, we will reach out to Dr. Maia Petties office. Follow-up scheduled for February 2024

## 2022-04-27 DIAGNOSIS — L821 Other seborrheic keratosis: Secondary | ICD-10-CM | POA: Diagnosis not present

## 2022-04-27 DIAGNOSIS — D044 Carcinoma in situ of skin of scalp and neck: Secondary | ICD-10-CM | POA: Diagnosis not present

## 2022-04-27 DIAGNOSIS — L570 Actinic keratosis: Secondary | ICD-10-CM | POA: Diagnosis not present

## 2022-04-27 LAB — HOMOCYSTEINE: Homocysteine: 12.3 umol/L — ABNORMAL HIGH (ref <11.4)

## 2022-04-28 LAB — PROTEIN ELECTROPHORESIS, SERUM
Albumin ELP: 4.3 g/dL (ref 3.8–4.8)
Alpha 1: 0.2 g/dL (ref 0.2–0.3)
Alpha 2: 0.8 g/dL (ref 0.5–0.9)
Beta 2: 0.3 g/dL (ref 0.2–0.5)
Beta Globulin: 0.4 g/dL (ref 0.4–0.6)
Gamma Globulin: 0.7 g/dL — ABNORMAL LOW (ref 0.8–1.7)
Total Protein: 6.8 g/dL (ref 6.1–8.1)

## 2022-05-10 ENCOUNTER — Telehealth: Payer: Self-pay | Admitting: Internal Medicine

## 2022-05-10 NOTE — Telephone Encounter (Signed)
Increase Lyrica to 100 mg nightly.  Send a new prescription if needed. Patient to notify how he is doing in 1 month.

## 2022-05-10 NOTE — Telephone Encounter (Signed)
New dose of Lyrica '50mg'$  not helping w/ neuropathy. Please advise?

## 2022-05-10 NOTE — Telephone Encounter (Signed)
Patient states the new medication that he is on has not helped him at all. He stated the Lyrica has not touched him, and would like to know what else he can do. Please advise.

## 2022-05-11 MED ORDER — PREGABALIN 100 MG PO CAPS: 100.0000 mg | ORAL_CAPSULE | Freq: Every evening | ORAL | 6 refills | Status: DC | PRN

## 2022-05-11 NOTE — Telephone Encounter (Signed)
PDMP okay, Rx sent 

## 2022-05-11 NOTE — Telephone Encounter (Signed)
Spoke w/ Pt- he has agreed to try Lyrica '100mg'$ , can you send in for me? Thank you.

## 2022-05-30 MED ORDER — PREGABALIN 100 MG PO CAPS: 100.0000 mg | ORAL_CAPSULE | Freq: Two times a day (BID) | ORAL | 6 refills | Status: DC

## 2022-05-30 NOTE — Telephone Encounter (Signed)
Please advise 

## 2022-05-30 NOTE — Telephone Encounter (Signed)
Spoke w/ Pt- informed of recommendations. Pt verbalized understanding.  

## 2022-05-30 NOTE — Telephone Encounter (Signed)
Advise patient: Recommend to increase Lyrica 100 mg from 1 tablet a day to 1 tablet twice daily.  Rx sent. Call if side effects If that is not helping, I could refer him to neurology. Of note,  I do not believe he ever had a NCS and his pain management doctor's office does not perform NCSs (per fax received from them).

## 2022-05-30 NOTE — Telephone Encounter (Signed)
Pt called stating that the new dosage of lyrica is not working and would like to look into what other options are available. Please Advise.

## 2022-05-30 NOTE — Addendum Note (Signed)
Addended by: Kathlene November E on: 05/30/2022 11:55 AM   Modules accepted: Orders

## 2022-06-10 ENCOUNTER — Telehealth: Payer: Self-pay | Admitting: Internal Medicine

## 2022-06-10 MED ORDER — PREGABALIN 100 MG PO CAPS: 100.0000 mg | ORAL_CAPSULE | Freq: Two times a day (BID) | ORAL | 6 refills | Status: DC

## 2022-06-10 NOTE — Telephone Encounter (Signed)
Prescription sent

## 2022-06-10 NOTE — Telephone Encounter (Signed)
Pt called stating that the pharmacy had informed him that they did not have the new Rx of lyrica that Dr. Larose Kells had increased him to. After reviewing chart, advise him that it looked like it had been sent in on 12.11.23 but a note would be sent back to look into this issue. Pt acknowledged understanding.

## 2022-06-10 NOTE — Telephone Encounter (Signed)
Can you resend rx for Lyrica? Per pharmacy they do not have it.

## 2022-06-17 DIAGNOSIS — G609 Hereditary and idiopathic neuropathy, unspecified: Secondary | ICD-10-CM | POA: Diagnosis not present

## 2022-06-17 LAB — HM DIABETES FOOT EXAM

## 2022-06-24 DIAGNOSIS — M4807 Spinal stenosis, lumbosacral region: Secondary | ICD-10-CM | POA: Diagnosis not present

## 2022-06-24 DIAGNOSIS — M5431 Sciatica, right side: Secondary | ICD-10-CM | POA: Diagnosis not present

## 2022-06-28 ENCOUNTER — Other Ambulatory Visit: Payer: Self-pay | Admitting: Internal Medicine

## 2022-06-28 DIAGNOSIS — R202 Paresthesia of skin: Secondary | ICD-10-CM

## 2022-07-27 ENCOUNTER — Encounter: Payer: Self-pay | Admitting: Internal Medicine

## 2022-07-27 ENCOUNTER — Ambulatory Visit (INDEPENDENT_AMBULATORY_CARE_PROVIDER_SITE_OTHER): Payer: PPO | Admitting: Internal Medicine

## 2022-07-27 VITALS — BP 136/80 | HR 63 | Temp 98.5°F | Resp 18 | Ht 70.0 in | Wt 232.5 lb

## 2022-07-27 DIAGNOSIS — R7989 Other specified abnormal findings of blood chemistry: Secondary | ICD-10-CM | POA: Diagnosis not present

## 2022-07-27 DIAGNOSIS — I10 Essential (primary) hypertension: Secondary | ICD-10-CM | POA: Diagnosis not present

## 2022-07-27 DIAGNOSIS — E7800 Pure hypercholesterolemia, unspecified: Secondary | ICD-10-CM | POA: Diagnosis not present

## 2022-07-27 DIAGNOSIS — E1169 Type 2 diabetes mellitus with other specified complication: Secondary | ICD-10-CM | POA: Diagnosis not present

## 2022-07-27 DIAGNOSIS — G629 Polyneuropathy, unspecified: Secondary | ICD-10-CM

## 2022-07-27 LAB — LIPID PANEL
Cholesterol: 130 mg/dL (ref 0–200)
HDL: 31.3 mg/dL — ABNORMAL LOW (ref >39.00)
NonHDL: 98.79
Total CHOL/HDL Ratio: 4
Triglycerides: 270 mg/dL — ABNORMAL HIGH (ref 0.0–149.0)
VLDL: 54 mg/dL — ABNORMAL HIGH (ref 0.0–40.0)

## 2022-07-27 LAB — BASIC METABOLIC PANEL
BUN: 22 mg/dL (ref 6–23)
CO2: 26 mEq/L (ref 19–32)
Calcium: 9 mg/dL (ref 8.4–10.5)
Chloride: 104 mEq/L (ref 96–112)
Creatinine, Ser: 0.96 mg/dL (ref 0.40–1.50)
GFR: 74.7 mL/min (ref >60.00)
Glucose, Bld: 134 mg/dL — ABNORMAL HIGH (ref 70–99)
Potassium: 4.3 mEq/L (ref 3.5–5.1)
Sodium: 140 mEq/L (ref 135–145)

## 2022-07-27 LAB — ALT: ALT: 16 U/L (ref 0–53)

## 2022-07-27 LAB — MICROALBUMIN / CREATININE URINE RATIO
Creatinine,U: 153.1 mg/dL
Microalb Creat Ratio: 0.5 mg/g (ref 0.0–30.0)
Microalb, Ur: <0.7 mg/dL (ref 0.0–1.9)

## 2022-07-27 LAB — LDL CHOLESTEROL, DIRECT: Direct LDL: 69 mg/dL

## 2022-07-27 LAB — AST: AST: 18 U/L (ref 0–37)

## 2022-07-27 LAB — T4, FREE: Free T4: 0.7 ng/dL (ref 0.60–1.60)

## 2022-07-27 LAB — HEMOGLOBIN A1C: Hgb A1c MFr Bld: 6.6 % — ABNORMAL HIGH (ref 4.6–6.5)

## 2022-07-27 LAB — TSH: TSH: 7.21 u[IU]/mL — ABNORMAL HIGH (ref 0.35–5.50)

## 2022-07-27 MED ORDER — PREGABALIN 200 MG PO CAPS: 200.0000 mg | ORAL_CAPSULE | Freq: Two times a day (BID) | ORAL | 3 refills | Status: DC

## 2022-07-27 NOTE — Progress Notes (Unsigned)
Subjective:    Patient ID: Bradley Navarro, male    DOB: 1940-06-30, 82 y.o.   MRN: [id number]  DOS:  07/27/2022 Type of visit - description: f/u  Here for follow-up. Chronic medical issues were addressed. Neuropathy: No major help with Lyrica or B12.   Review of Systems See above   Past Medical History:  Diagnosis Date   Chronic back pain    Elevated BP without diagnosis of hypertension 04/24/2017   History of headache    frontal lobe cluster headaches   History of kidney stones    Post laminectomy syndrome    SCC (squamous cell carcinoma) 01/2020   Sciatica of right side 2016   S/P L5-S1 revision lam/PSF on 04-10-12 w/ relief of RLE pain post op, MRI 01/2014 shows minimal foraminal stenosis at L4-5 w/o recurrent stenosis at L5-S1    Past Surgical History:  Procedure Laterality Date   [place] SURGERY  2012   L-5, Dr Vennie Homans   LUMBAR FUSION  2015   rod, plates   r arm surger      Current Outpatient Medications  Medication Instructions   atorvastatin (LIPITOR) 10 mg, Oral, Daily at bedtime   Calcium Polycarbophil (FIBER-CAPS PO) 1 capsule, Oral, Daily   losartan (COZAAR) 100 mg, Oral, Daily   morphine (MS CONTIN) 30 mg, Oral, 2 times daily   Multiple Vitamin (MULITIVITAMIN WITH MINERALS) TABS 1 tablet, Daily   omeprazole (PRILOSEC) 20 mg, Oral, 2 times daily   polyethylene glycol (MIRALAX / GLYCOLAX) 17 g, Oral, Every other day   pregabalin (LYRICA) 200 mg, Oral, 2 times daily   tamsulosin (FLOMAX) 0.4 mg, Oral, Daily   Vitamin D (Cholecalciferol) 2,000 Units, Oral, Daily       Objective:   Physical Exam BP 136/80   Pulse 63   Temp 98.5 F (36.9 C) (Oral)   Resp 18   Ht '5\' 10"'$  (1.778 m)   Wt 232 lb 8 oz (105.5 kg)   SpO2 97%   BMI 33.36 kg/m  General:   Well developed, NAD, BMI noted. HEENT:  Normocephalic . Face symmetric, atraumatic Lungs:  CTA B Normal respiratory effort, no intercostal retractions, no accessory muscle  use. Heart: RRR,  no murmur.  Lower extremities: no pretibial edema bilaterally  Skin: Not pale. Not jaundice Neurologic:  alert & oriented X3.  Speech normal, gait appropriate for age and unassisted Psych--  Cognition and judgment appear intact.  Cooperative with normal attention span and concentration.  Behavior appropriate. No anxious or depressed appearing.      Assessment     Assessment DM (A1c increased to 6.9 on 07-2020) DM neuropathy HTN High cholesterol h/o HA-- s/p extensive w/u in the past. Has seen Dr Maureen Chatters and neuro @ [facility]   h/o kidney stones , multiple Chronic back pain-- sees pain management, see surgeries Chronic hoarseness: Never seen by ENT Sees dermatology SCC Abnormal EKG: TWI, RBBB, see note from 05-2018 BPH, LUTS: Saw urology, last visit 2020.   PLAN DM: Diet controlled, check A1c. HTN: On losartan.  Checking labs High cholesterol: On Lipitor, check FLP, AST ALT. Neuropathy: See last visit, workup show slightly elevated homocystine, recommended B12 supplements which he took but could not tolerate due to increased appetite.  Did not have any impact on neuropathy.  Self DC'd B12 supplements. Also, I was unable to obtain a NCS, they do not do that test  at Dr Maia Petties office Plan: Increase Lyrica to 200  mg twice daily, BMP okay, Rx sent Subclinical hypothyroidism?  Check TFTs RTC 3 to 4 months CPX

## 2022-07-27 NOTE — Patient Instructions (Addendum)
Increase Lyrica from 100 mg to 200 mg: 1 tablet twice daily.  Vaccines I recommend:  Shingrix (shingles) RSV vaccine  Per our records you are due for your diabetic eye exam. Please contact your eye doctor to schedule an appointment. Please have them send copies of your office visit notes to [place]. Our fax number is [phone]. If you need a referral to an eye doctor please let us know.  Check the  blood pressure regularly BP GOAL is between 110/65 and  135/85. If it is consistently higher or lower, let me know      GO TO THE LAB : Get the blood work     [place], [place] back for   a physical exam in 3 to 4 months

## 2022-07-28 NOTE — Assessment & Plan Note (Signed)
DM: Diet controlled, check A1c. HTN: On losartan.  Checking labs High cholesterol: On Lipitor, check FLP, AST ALT. Neuropathy: See last visit, workup show slightly elevated homocystine, recommended B12 supplements which he took but could not tolerate due to increased appetite.  Did not have any impact on neuropathy.  Self DC'd B12 supplements. Also, I was unable to obtain a NCS, they do not do that test  at Dr Maia Petties office Plan: Increase Lyrica to 200 mg twice daily, BMP okay, Rx sent Subclinical hypothyroidism?  Check TFTs RTC 3 to 4 months CPX

## 2022-08-12 DIAGNOSIS — M792 Neuralgia and neuritis, unspecified: Secondary | ICD-10-CM | POA: Diagnosis not present

## 2022-08-12 LAB — HM DIABETES FOOT EXAM

## 2022-08-26 DIAGNOSIS — M25561 Pain in right knee: Secondary | ICD-10-CM | POA: Diagnosis not present

## 2022-08-26 DIAGNOSIS — M5431 Sciatica, right side: Secondary | ICD-10-CM | POA: Diagnosis not present

## 2022-08-26 DIAGNOSIS — M4807 Spinal stenosis, lumbosacral region: Secondary | ICD-10-CM | POA: Diagnosis not present

## 2022-08-26 DIAGNOSIS — M1711 Unilateral primary osteoarthritis, right knee: Secondary | ICD-10-CM | POA: Diagnosis not present

## 2022-09-27 ENCOUNTER — Encounter: Payer: Self-pay | Admitting: Internal Medicine

## 2022-10-07 DIAGNOSIS — M5431 Sciatica, right side: Secondary | ICD-10-CM | POA: Diagnosis not present

## 2022-10-07 DIAGNOSIS — M1711 Unilateral primary osteoarthritis, right knee: Secondary | ICD-10-CM | POA: Diagnosis not present

## 2022-10-07 DIAGNOSIS — M4807 Spinal stenosis, lumbosacral region: Secondary | ICD-10-CM | POA: Diagnosis not present

## 2022-10-10 ENCOUNTER — Other Ambulatory Visit: Payer: Self-pay | Admitting: Rehabilitation

## 2022-10-10 DIAGNOSIS — M1711 Unilateral primary osteoarthritis, right knee: Secondary | ICD-10-CM

## 2022-10-26 ENCOUNTER — Encounter: Payer: Self-pay | Admitting: Internal Medicine

## 2022-10-26 ENCOUNTER — Ambulatory Visit (INDEPENDENT_AMBULATORY_CARE_PROVIDER_SITE_OTHER): Payer: PPO | Admitting: Internal Medicine

## 2022-10-26 VITALS — BP 122/60 | HR 84 | Resp 12 | Ht 70.0 in | Wt 228.0 lb

## 2022-10-26 DIAGNOSIS — E1169 Type 2 diabetes mellitus with other specified complication: Secondary | ICD-10-CM | POA: Diagnosis not present

## 2022-10-26 DIAGNOSIS — Z0000 Encounter for general adult medical examination without abnormal findings: Secondary | ICD-10-CM | POA: Diagnosis not present

## 2022-10-26 DIAGNOSIS — I10 Essential (primary) hypertension: Secondary | ICD-10-CM

## 2022-10-26 NOTE — Assessment & Plan Note (Signed)
-  Td 2016 - pnm 23: 2011 &  07-2020.  Prevnar 2016 - Vaccines I recommend: Shingrix, RSV, COVID booster per CDC recommendations, flu shot every fall -CCS:  Never had a colonoscopy,  not interested in CCS at this point. -Prostate cancer screening: no further screenings  - labs:CBC A1c   -Diet and exercise doing very well, + weight loss. - Healthcare Power of attorney: Recommend to bring a copy

## 2022-10-26 NOTE — Assessment & Plan Note (Signed)
Here for CPX.  See separate documentation. DM: since the last visit has improved his diet,  +weight loss, check A1c. Neuropathy: Since the last visit, he increase Lyrica up to 400 mg twice daily, no improvement in symptoms, we had a long discussion about a referral but he is not interested, "many doctors told me there is nothing else to do".  Recommend to decrease Lyrica to 200 mg twice daily. HTN: BP looks very good.  No change. High cholesterol: Last LDL satisfactory Subclinical hypothyroidism: TFTs stable. RTC 5 to 6 months

## 2022-10-26 NOTE — Progress Notes (Signed)
Subjective:    Patient ID: Bradley Navarro, male    DOB: 02/27/1941, 82 y.o.   MRN: [id number]  DOS:  10/26/2022 Type of visit - description: cpx  Here for a physical exam. In general feels well. Has changed his diet , lost several pounds. Neuropathy: Not improved.  Wt Readings from Last 3 Encounters:  10/26/22 228 lb (103.4 kg)  07/27/22 232 lb 8 oz (105.5 kg)  04/25/22 224 lb 2 oz (101.7 kg)     Review of Systems  Other than above, a 14 point review of systems is negative      Past Medical History:  Diagnosis Date   Chronic back pain    Elevated BP without diagnosis of hypertension 04/24/2017   History of headache    frontal lobe cluster headaches   History of kidney stones    Post laminectomy syndrome    SCC (squamous cell carcinoma) 01/2020   Sciatica of right side 2016   S/P L5-S1 revision lam/PSF on 04-10-12 w/ relief of RLE pain post op, MRI 01/2014 shows minimal foraminal stenosis at L4-5 w/o recurrent stenosis at L5-S1    Past Surgical History:  Procedure Laterality Date   APPENDECTOMY     LUMBAR DISC SURGERY  2012   L-5, Dr Elizabeth Sauer   LUMBAR FUSION  2015   rod, plates   r arm surger     Social History   Socioeconomic History   Marital status: Widowed    Spouse name: Not on file   Number of children: 2   Years of education: Not on file   Highest education level: Not on file  Occupational History   Occupation: retired  Tobacco Use   Smoking status: Former   Smokeless tobacco: Never   Tobacco comments:    quit in the 90, light smoker   Substance and Sexual Activity   Alcohol use: No    Alcohol/week: 0.0 standard drinks of alcohol   Drug use: No   Sexual activity: Not on file  Other Topics Concern   Not on file  Social History Narrative   Lost wife 10-2021   children 2   5 g-children, one is very close to him, lives in [place]    ( 2 step daughters, does not see them)   Social Determinants of Health   Financial Resource Strain: Low Risk   (12/24/2021)   Overall Financial Resource Strain (CARDIA)    Difficulty of Paying Living Expenses: Not hard at all  Food Insecurity: No Food Insecurity (11/23/2020)   Hunger Vital Sign    Worried About Running Out of Food in the Last Year: Never true    Ran Out of Food in the Last Year: Never true  Transportation Needs: No Transportation Needs (12/24/2021)   PRAPARE - Administrator, Civil Service (Medical): No    Lack of Transportation (Non-Medical): No  Physical Activity: Sufficiently Active (03/03/2022)   Exercise Vital Sign    Days of Exercise per Week: 7 days    Minutes of Exercise per Session: 60 min  Stress: No Stress Concern Present (11/23/2020)   [employer] of Occupational Health - Occupational Stress Questionnaire    Feeling of Stress : Only a little  Social Connections: Moderately Isolated (12/24/2021)   Social Connection and Isolation Panel [NHANES]    Frequency of Communication with Friends and Family: Three times a week    Frequency of Social Gatherings with Friends and Family: More than three times a week  Attends Religious Services: Never    Active Member of Clubs or Organizations: Yes    Attends Banker Meetings: More than 4 times per year    Marital Status: Widowed  Intimate Partner Violence: Not At Risk (11/23/2020)   Humiliation, Afraid, Rape, and Kick questionnaire    Fear of Current or Ex-Partner: No    Emotionally Abused: No    Physically Abused: No    Sexually Abused: No     Current Outpatient Medications  Medication Instructions   atorvastatin (LIPITOR) 10 mg, Oral, Daily at bedtime   Calcium Polycarbophil (FIBER-CAPS PO) 1 capsule, Oral, Daily   losartan (COZAAR) 100 mg, Oral, Daily   morphine (MS CONTIN) 30 mg, Oral, 2 times daily   Multiple Vitamin (MULITIVITAMIN WITH MINERALS) TABS 1 tablet, Daily   omeprazole (PRILOSEC) 20 mg, Oral, 2 times daily   polyethylene glycol (MIRALAX / GLYCOLAX) 17 g, Oral, Every other day    pregabalin (LYRICA) 200 mg, Oral, 2 times daily   tamsulosin (FLOMAX) 0.4 mg, Oral, Daily   Vitamin D (Cholecalciferol) 2,000 Units, Oral, Daily       Objective:   Physical Exam BP 122/60 (BP Location: Left Arm, Cuff Size: Large)   Pulse 84   Resp 12   Ht 5\' 10"  (1.778 m)   Wt 228 lb (103.4 kg)   SpO2 92%   BMI 32.71 kg/m  General: Well developed, NAD, BMI noted Neck: No  thyromegaly  HEENT:  Normocephalic . Face symmetric, atraumatic Lungs:  CTA B Normal respiratory effort, no intercostal retractions, no accessory muscle use. Heart: RRR,  no murmur.  Abdomen:  Not distended, soft, non-tender. No rebound or rigidity.   Lower extremities: no pretibial edema bilaterally  Skin: Exposed areas without rash. Not pale. Not jaundice Neurologic:  alert & oriented X3.  Speech normal, gait appropriate for age and unassisted Strength symmetric and appropriate for age.  Psych: Cognition and judgment appear intact.  Cooperative with normal attention span and concentration.  Behavior appropriate. No anxious or depressed appearing.     Assessment     Assessment DM (A1c increased to 6.9 on 07-2020) DM neuropathy HTN High cholesterol h/o HA-- s/p extensive w/u in the past. Has seen Dr Richardean Chimera and neuro @ [facility]   h/o kidney stones , multiple Chronic back pain-- sees pain management, see surgeries Chronic hoarseness: Never seen by ENT Sees dermatology [facility] Abnormal EKG: TWI, RBBB, see note from 05-2018 BPH, LUTS: Saw urology, last visit 2020.   PLAN Here for CPX.  See separate documentation. DM: since the last visit has improved his diet,  +weight loss, check A1c. Neuropathy: Since the last visit, he increase Lyrica up to 400 mg twice daily, no improvement in symptoms, we had a long discussion about a referral but he is not interested, "many doctors told me there is nothing else to do".  Recommend to decrease Lyrica to 200 mg twice daily. HTN: BP looks very good.  No  change. High cholesterol: Last LDL satisfactory Subclinical hypothyroidism: TFTs stable. RTC 5 to 6 months  -Td 2016 - pnm 23: 2011 &  07-2020.  Prevnar 2016 - Vaccines I recommend: Shingrix, RSV, COVID booster per CDC recommendations, flu shot every fall -CCS:  Never had a colonoscopy,  not interested in CCS at this point. -Prostate cancer screening: no further screenings  - labs:CBC A1c   -Diet and exercise doing very well, + weight loss. - Healthcare Power of attorney: Recommend to bring a copy

## 2022-10-26 NOTE — Patient Instructions (Addendum)
Consider bring your healthcare power of attorney to be scanned in your chart   Vaccines I recommend: Shingrix, RSV, COVID booster per CDC recommendations, flu shot every fall    GO TO THE LAB : Get the blood work     GO TO THE FRONT DESK, PLEASE SCHEDULE YOUR APPOINTMENTS Come back for   checkup in 4 to 5 months    Per our records you are due for your diabetic eye exam. Please contact your eye doctor to schedule an appointment. Please have them send copies of your office visit notes to [place]. Our fax number is [phone]. If you need a referral to an eye doctor please let us know.

## 2022-10-27 LAB — CBC WITH DIFFERENTIAL/PLATELET
Basophils Absolute: 0.1 10*3/uL (ref 0.0–0.1)
Basophils Relative: 0.9 % (ref 0.0–3.0)
Eosinophils Absolute: 0.2 10*3/uL (ref 0.0–0.7)
Eosinophils Relative: 2.7 % (ref 0.0–5.0)
HCT: 38.6 % — ABNORMAL LOW (ref 39.0–52.0)
Hemoglobin: 13.4 g/dL (ref 13.0–17.0)
Lymphocytes Relative: 37.6 % (ref 12.0–46.0)
Lymphs Abs: 2.1 10*3/uL (ref 0.7–4.0)
MCHC: 34.8 g/dL (ref 30.0–36.0)
MCV: 91.8 fl (ref 78.0–100.0)
Monocytes Absolute: 0.4 10*3/uL (ref 0.1–1.0)
Monocytes Relative: 7.4 % (ref 3.0–12.0)
Neutro Abs: 2.9 10*3/uL (ref 1.4–7.7)
Neutrophils Relative %: 51.4 % (ref 43.0–77.0)
Platelets: 182 10*3/uL (ref 150.0–400.0)
RBC: 4.21 Mil/uL — ABNORMAL LOW (ref 4.22–5.81)
RDW: 13.3 % (ref 11.5–15.5)
WBC: 5.7 10*3/uL (ref 4.0–10.5)

## 2022-10-27 LAB — HEMOGLOBIN A1C: Hgb A1c MFr Bld: 6.8 % — ABNORMAL HIGH (ref 4.6–6.5)

## 2022-10-28 DIAGNOSIS — M4807 Spinal stenosis, lumbosacral region: Secondary | ICD-10-CM | POA: Diagnosis not present

## 2022-10-28 DIAGNOSIS — M1711 Unilateral primary osteoarthritis, right knee: Secondary | ICD-10-CM | POA: Diagnosis not present

## 2022-10-28 DIAGNOSIS — M5431 Sciatica, right side: Secondary | ICD-10-CM | POA: Diagnosis not present

## 2022-10-30 ENCOUNTER — Other Ambulatory Visit: Payer: PPO

## 2022-12-06 ENCOUNTER — Ambulatory Visit (INDEPENDENT_AMBULATORY_CARE_PROVIDER_SITE_OTHER): Payer: PPO | Admitting: *Deleted

## 2022-12-06 DIAGNOSIS — Z0000 Encounter for general adult medical examination without abnormal findings: Secondary | ICD-10-CM | POA: Diagnosis not present

## 2022-12-06 NOTE — Progress Notes (Signed)
Subjective:   Bradley Navarro is a 82 y.o. male who presents for Medicare Annual/Subsequent preventive examination.  I connected with  Bradley Navarro on 12/06/22 by a audio enabled telemedicine application and verified that I am speaking with the correct person using two identifiers.   Patient Location: Home  Provider Location: Office/Clinic  I discussed the limitations of evaluation and management by telemedicine. The patient expressed understanding and agreed to proceed.   Review of Systems     Cardiac Risk Factors include: advanced age (>40men, >65 women);male gender;diabetes mellitus;dyslipidemia;hypertension     Objective:    Today's Vitals   12/06/22 1019  PainSc: 5    There is no height or weight on file to calculate BMI.     12/06/2022   10:19 AM 12/02/2021    9:44 AM 11/23/2020    9:51 AM 10/03/2019   11:07 AM 04/20/2015    8:25 AM  Advanced Directives  Does Patient Have a Medical Advance Directive? Yes Yes Yes Yes No  Type of Estate agent of [place];Living will Healthcare Power of [place];Out of facility DNR (pink MOST or yellow form);Living will Healthcare Power of [place];Living will Healthcare Power of [place];Living will   Does patient want to make changes to medical advance directive? No - Patient declined No - Patient declined  No - Patient declined   Copy of Healthcare Power of Attorney in Chart? No - copy requested No - copy requested No - copy requested No - copy requested   Would patient like information on creating a medical advance directive?     Yes - Educational materials given    Current Medications (verified) Outpatient Encounter Medications as of 12/06/2022  Medication Sig   atorvastatin (LIPITOR) 10 MG tablet Take 1 tablet (10 mg total) by mouth at bedtime.   Calcium Polycarbophil (FIBER-CAPS PO) Take 1 capsule by mouth daily.   losartan (COZAAR) 100 MG tablet Take 1 tablet (100 mg total) by mouth daily.    morphine (MS CONTIN) 30 MG 12 hr tablet Take 30 mg by mouth in the morning and at bedtime.    Multiple Vitamin (MULITIVITAMIN WITH MINERALS) TABS Take 1 tablet by mouth daily.   omeprazole (PRILOSEC) 20 MG capsule Take 1 capsule (20 mg total) by mouth 2 (two) times daily. (Patient taking differently: Take 20 mg by mouth daily.)   polyethylene glycol (MIRALAX / GLYCOLAX) 17 g packet Take 17 g by mouth every other day.   pregabalin (LYRICA) 200 MG capsule Take 1 capsule (200 mg total) by mouth 2 (two) times daily.   tamsulosin (FLOMAX) 0.4 MG CAPS capsule Take 0.4 mg by mouth daily.   Vitamin D, Cholecalciferol, 25 MCG (1000 UT) CAPS Take 2,000 Units by mouth daily.   No facility-administered encounter medications on file as of 12/06/2022.    Allergies (verified) Penicillins and Gabapentin   History: Past Medical History:  Diagnosis Date   Chronic back pain    Elevated BP without diagnosis of hypertension 04/24/2017   History of headache    frontal lobe cluster headaches   History of kidney stones    Post laminectomy syndrome    SCC (squamous cell carcinoma) 01/2020   Sciatica of right side 2016   S/P L5-S1 revision lam/PSF on 04-10-12 w/ relief of RLE pain post op, MRI 01/2014 shows minimal foraminal stenosis at L4-5 w/o recurrent stenosis at L5-S1   Past Surgical History:  Procedure Laterality Date   APPENDECTOMY     LUMBAR DISC  SURGERY  2012   L-5, Dr Elizabeth Sauer   LUMBAR FUSION  2015   rod, plates   r arm surger     Family History  Problem Relation Age of Onset   CAD Father 72       MI    Dementia Mother    Colon cancer Neg Hx    Prostate cancer Neg Hx    Diabetes Neg Hx    Social History   Socioeconomic History   Marital status: Widowed    Spouse name: Not on file   Number of children: 2   Years of education: Not on file   Highest education level: Not on file  Occupational History   Occupation: retired  Tobacco Use   Smoking status: Former   Smokeless tobacco:  Never   Tobacco comments:    quit in the 90, light smoker   Substance and Sexual Activity   Alcohol use: No    Alcohol/week: 0.0 standard drinks of alcohol   Drug use: No   Sexual activity: Not on file  Other Topics Concern   Not on file  Social History Narrative   Lost wife 10-2021   children 2   5 g-children, one is very close to him, lives in [place]    ( 2 step daughters, does not see them)   Social Determinants of Health   Financial Resource Strain: Low Risk  (12/24/2021)   Overall Financial Resource Strain (CARDIA)    Difficulty of Paying Living Expenses: Not hard at all  Food Insecurity: No Food Insecurity (12/06/2022)   Hunger Vital Sign    Worried About Running Out of Food in the Last Year: Never true    Ran Out of Food in the Last Year: Never true  Transportation Needs: No Transportation Needs (12/06/2022)   PRAPARE - Administrator, Civil Service (Medical): No    Lack of Transportation (Non-Medical): No  Physical Activity: Sufficiently Active (12/06/2022)   Exercise Vital Sign    Days of Exercise per Week: 7 days    Minutes of Exercise per Session: 30 min  Stress: No Stress Concern Present (11/23/2020)   [employer] of Occupational Health - Occupational Stress Questionnaire    Feeling of Stress : Only a little  Social Connections: Moderately Isolated (12/24/2021)   Social Connection and Isolation Panel [NHANES]    Frequency of Communication with Friends and Family: Three times a week    Frequency of Social Gatherings with Friends and Family: More than three times a week    Attends Religious Services: Never    Database administrator or Organizations: Yes    Attends Engineer, structural: More than 4 times per year    Marital Status: Widowed    Tobacco Counseling Counseling given: Not Answered Tobacco comments: quit in the 90, light smoker    Clinical Intake:  Pre-visit preparation completed: Yes  Pain : 0-10 Pain Score: 5  Pain Location:  Back Pain Orientation: Lower Pain Onset: More than a month ago Pain Frequency: Constant     Nutritional Risks: None Diabetes: Yes CBG done?: No Did pt. bring in CBG monitor from home?: No  How often do you need to have someone help you when you read instructions, pamphlets, or other written materials from your doctor or pharmacy?: 1 - Never  Interpreter Needed?: No  Information entered by :: Donne Anon, CMA   Activities of Daily Living    12/06/2022   10:23 AM  In  your present state of health, do you have any difficulty performing the following activities:  Hearing? 1  Comment does not wear hearing aids  Vision? 0  Difficulty concentrating or making decisions? 0  Walking or climbing stairs? 1  Dressing or bathing? 0  Doing errands, shopping? 0  Preparing Food and eating ? N  Using the Toilet? N  In the past six months, have you accidently leaked urine? N  Do you have problems with loss of bowel control? N  Managing your Medications? N  Managing your Finances? N  Housekeeping or managing your Housekeeping? N    Patient Care Team: Wanda Plump, MD as PCP - General Czinsky, Lise Auer, [place] as Physician Assistant (Orthopedic Surgery) Letta Kocher, MD as Consulting Physician (Rehabilitation) Crista Elliot, MD as Consulting Physician (Urology) [place], Elvin So, MD as Referring Physician (Dermatology) Henrene Pastor, RPH-CPP (Pharmacist)  Indicate any recent Medical Services you may have received from other than [facility] providers in the past year (date may be approximate).     Assessment:   This is a routine wellness examination for Issac.  Hearing/Vision screen No results found.  Dietary issues and exercise activities discussed:     Goals Addressed   None    Depression Screen    12/06/2022   10:22 AM 10/26/2022    1:14 PM 07/27/2022   12:59 PM 04/25/2022   10:48 AM 12/02/2021    9:46 AM 07/27/2021    3:37 PM 01/18/2021    3:00 PM  PHQ 2/9 Scores   PHQ - 2 Score 1 0 0 0 0 0 0    Fall Risk    12/06/2022   10:26 AM 10/26/2022    1:14 PM 07/27/2022   12:59 PM 04/25/2022   10:48 AM 12/02/2021    9:44 AM  Fall Risk   Falls in the past year? 0 0 0 0 0  Number falls in past yr: 0 0 0 0 0  Injury with Fall? 0 0 0 0 0  Risk for fall due to : No Fall Risks No Fall Risks   No Fall Risks  Follow up Falls evaluation completed  Falls evaluation completed Falls evaluation completed Falls evaluation completed    MEDICARE RISK AT HOME:  Medicare Risk at Home - 12/06/22 1025     Any stairs in or around the home? Yes    If so, are there any without handrails? No    Home free of loose throw rugs in walkways, pet beds, electrical cords, etc? Yes    Adequate lighting in your home to reduce risk of falls? Yes    Life alert? Yes    Use of a cane, walker or w/c? No    Grab bars in the bathroom? Yes    Shower chair or bench in shower? No    Elevated toilet seat or a handicapped toilet? No             TIMED UP AND GO:  Was the test performed?  No    Cognitive Function:    04/20/2015    8:42 AM  MMSE - Mini Mental State Exam  Orientation to time 5  Orientation to Place 5  Registration 3  Attention/ Calculation 5  Recall 3  Language- name 2 objects 2  Language- repeat 1  Language- follow 3 step command 3  Language- read & follow direction 1  Write a sentence 1  Copy design 1  Total score 30  12/06/2022   10:26 AM 12/02/2021    9:50 AM  6CIT Screen  What Year? 0 points 0 points  What month? 0 points 0 points  What time? 0 points 0 points  Count back from 20 0 points 0 points  Months in reverse 0 points 0 points  Repeat phrase 0 points 2 points  Total Score 0 points 2 points    Immunizations Immunization History  Administered Date(s) Administered   Fluad Quad(high Dose 65+) 05/02/2019, 03/26/2020, 03/16/2022   Influenza Split 03/30/2011, 03/28/2012   Influenza Whole 03/02/2010   Influenza, High Dose Seasonal PF  03/17/2015, 04/19/2016, 03/16/2017, 03/26/2018   Influenza,inj,Quad PF,6+ Mos 03/20/2013, 03/25/2014   Influenza-Unspecified 03/15/2021   PFIZER(Purple Top)SARS-COV-2 Vaccination 08/02/2019, 08/24/2019, 03/20/2020, 10/26/2020   Pfizer Covid-19 Vaccine Bivalent Booster 47yrs & up 02/19/2021   Pneumococcal Conjugate-13 04/17/2015   Pneumococcal Polysaccharide-23 03/02/2010, 07/21/2020   Td 04/17/2015   Unspecified SARS-COV-2 Vaccination 03/22/2022    TDAP status: Up to date  Flu Vaccine status: Up to date  Pneumococcal vaccine status: Up to date  Covid-19 vaccine status: Information provided on how to obtain vaccines.   Qualifies for Shingles Vaccine? Yes   Zostavax completed No   Shingrix Completed?: No.    Education has been provided regarding the importance of this vaccine. Patient has been advised to call insurance company to determine out of pocket expense if they have not yet received this vaccine. Advised may also receive vaccine at local pharmacy or Health Dept. Verbalized acceptance and understanding.  Screening Tests Health Maintenance  Topic Date Due   OPHTHALMOLOGY EXAM  Never done   FOOT EXAM  07/27/2022   Medicare Annual Wellness (AWV)  12/03/2022   COVID-19 Vaccine (7 - 2023-24 season) 01/24/2023 (Originally 05/17/2022)   INFLUENZA VACCINE  01/19/2023   HEMOGLOBIN A1C  04/28/2023   Diabetic kidney evaluation - eGFR measurement  07/28/2023   Diabetic kidney evaluation - Urine ACR  07/28/2023   DTaP/Tdap/Td (2 - Tdap) 04/16/2025   Pneumonia Vaccine 55+ Years old  Completed   HPV VACCINES  Aged Out   Hepatitis C Screening  Discontinued   Zoster Vaccines- Shingrix  Discontinued    Health Maintenance  Health Maintenance Due  Topic Date Due   OPHTHALMOLOGY EXAM  Never done   FOOT EXAM  07/27/2022   Medicare Annual Wellness (AWV)  12/03/2022    Colorectal cancer screening: No longer required.   Lung Cancer Screening: (Low Dose CT Chest recommended if Age  60-80 years, 20 pack-year currently smoking OR have quit w/in 15years.) does not qualify.    Additional Screening:  Hepatitis C Screening: does qualify; Completed 01/24/22  Vision Screening: Recommended annual ophthalmology exams for early detection of glaucoma and other disorders of the eye. Is the patient up to date with their annual eye exam?  Yes  Who is the provider or what is the name of the office in which the patient attends annual eye exams? [facility] If pt is not established with a provider, would they like to be referred to a provider to establish care? No .   Dental Screening: Recommended annual dental exams for proper oral hygiene  Diabetic Foot Exam: Diabetic Foot Exam: Overdue, Pt has been advised about the importance in completing this exam. Pt is scheduled for diabetic foot exam on N/a.  Community Resource Referral / Chronic Care Management: CRR required this visit?  No   CCM required this visit?  No     Plan:  I have personally reviewed and noted the following in the patient's chart:   Medical and social history Use of alcohol, tobacco or illicit drugs  Current medications and supplements including opioid prescriptions. Patient is currently taking opioid prescriptions. Information provided to patient regarding non-opioid alternatives. Patient advised to discuss non-opioid treatment plan with their provider. Functional ability and status Nutritional status Physical activity Advanced directives List of other physicians Hospitalizations, surgeries, and ER visits in previous 12 months Vitals Screenings to include cognitive, depression, and falls Referrals and appointments  In addition, I have reviewed and discussed with patient certain preventive protocols, quality metrics, and best practice recommendations. A written personalized care plan for preventive services as well as general preventive health recommendations were provided to patient.     Donne Anon, CMA   12/06/2022   After Visit Summary: (Mail) Due to this being a telephonic visit, the after visit summary with patients personalized plan was offered to patient via mail   Nurse Notes: None

## 2022-12-06 NOTE — Patient Instructions (Signed)
[name] , Thank you for taking time to come for your Medicare Wellness Visit. I appreciate your ongoing commitment to your health goals. Please review the following plan we discussed and let me know if I can assist you in the future.     This is a list of the screening recommended for you and due dates:  Health Maintenance  Topic Date Due   Eye exam for diabetics  Never done   Complete foot exam   07/27/2022   COVID-19 Vaccine (7 - 2023-24 season) 01/24/2023*   Flu Shot  01/19/2023   Hemoglobin A1C  04/28/2023   Yearly kidney function blood test for diabetes  07/28/2023   Yearly kidney health urinalysis for diabetes  07/28/2023   Medicare Annual Wellness Visit  12/06/2023   DTaP/Tdap/Td vaccine (2 - Tdap) 04/16/2025   Pneumonia Vaccine  Completed   HPV Vaccine  Aged Out   Hepatitis C Screening  Discontinued   Zoster (Shingles) Vaccine  Discontinued  *Topic was postponed. The date shown is not the original due date.    Next appointment: Follow up in one year for your annual wellness visit.   Preventive Care 67 Years and Older, Male Preventive care refers to lifestyle choices and visits with your health care provider that can promote health and wellness. What does preventive care include? A yearly physical exam. This is also called an annual well check. Dental exams once or twice a year. Routine eye exams. Ask your health care provider how often you should have your eyes checked. Personal lifestyle choices, including: Daily care of your teeth and gums. Regular physical activity. Eating a healthy diet. Avoiding tobacco and drug use. Limiting alcohol use. Practicing safe sex. Taking low doses of aspirin every day. Taking vitamin and mineral supplements as recommended by your health care provider. What happens during an annual well check? The services and screenings done by your health care provider during your annual well check will depend on your age, overall health,  lifestyle risk factors, and family history of disease. Counseling  Your health care provider may ask you questions about your: Alcohol use. Tobacco use. Drug use. Emotional well-being. Home and relationship well-being. Sexual activity. Eating habits. History of falls. Memory and ability to understand (cognition). Work and work Astronomer. Screening  You may have the following tests or measurements: Height, weight, and BMI. Blood pressure. Lipid and cholesterol levels. These may be checked every 5 years, or more frequently if you are over 62 years old. Skin check. Lung cancer screening. You may have this screening every year starting at age 41 if you have a 30-pack-year history of smoking and currently smoke or have quit within the past 15 years. Fecal occult blood test (FOBT) of the stool. You may have this test every year starting at age 77. Flexible sigmoidoscopy or colonoscopy. You may have a sigmoidoscopy every 5 years or a colonoscopy every 10 years starting at age 50. Prostate cancer screening. Recommendations will vary depending on your family history and other risks. Hepatitis C blood test. Hepatitis B blood test. Sexually transmitted disease (STD) testing. Diabetes screening. This is done by checking your blood sugar (glucose) after you have not eaten for a while (fasting). You may have this done every 1-3 years. Abdominal aortic aneurysm (AAA) screening. You may need this if you are a current or former smoker. Osteoporosis. You may be screened starting at age 45 if you are at high risk. Talk with your health care provider about your  test results, treatment options, and if necessary, the need for more tests. Vaccines  Your health care provider may recommend certain vaccines, such as: Influenza vaccine. This is recommended every year. Tetanus, diphtheria, and acellular pertussis (Tdap, Td) vaccine. You may need a Td booster every 10 years. Zoster vaccine. You may need this  after age 46. Pneumococcal 13-valent conjugate (PCV13) vaccine. One dose is recommended after age 66. Pneumococcal polysaccharide (PPSV23) vaccine. One dose is recommended after age 15. Talk to your health care provider about which screenings and vaccines you need and how often you need them. This information is not intended to replace advice given to you by your health care provider. Make sure you discuss any questions you have with your health care provider. Document Released: 07/03/2015 Document Revised: 02/24/2016 Document Reviewed: 04/07/2015 Elsevier Interactive Patient Education  2017 [employer].  Fall Prevention in the Home Falls can cause injuries. They can happen to people of all ages. There are many things you can do to make your home safe and to help prevent falls. What can I do on the outside of my home? Regularly fix the edges of walkways and driveways and fix any cracks. Remove anything that might make you trip as you walk through a door, such as a raised step or threshold. Trim any bushes or trees on the path to your home. Use bright outdoor lighting. Clear any walking paths of anything that might make someone trip, such as rocks or tools. Regularly check to see if handrails are loose or broken. Make sure that both sides of any steps have handrails. Any raised decks and porches should have guardrails on the edges. Have any leaves, snow, or ice cleared regularly. Use sand or salt on walking paths during winter. Clean up any spills in your garage right away. This includes oil or grease spills. What can I do in the bathroom? Use night lights. Install grab bars by the toilet and in the tub and shower. Do not use towel bars as grab bars. Use non-skid mats or decals in the tub or shower. If you need to sit down in the shower, use a plastic, non-slip stool. Keep the floor dry. Clean up any water that spills on the floor as soon as it happens. Remove soap buildup in the tub or  shower regularly. Attach bath mats securely with double-sided non-slip rug tape. Do not have throw rugs and other things on the floor that can make you trip. What can I do in the bedroom? Use night lights. Make sure that you have a light by your bed that is easy to reach. Do not use any sheets or blankets that are too big for your bed. They should not hang down onto the floor. Have a firm chair that has side arms. You can use this for support while you get dressed. Do not have throw rugs and other things on the floor that can make you trip. What can I do in the kitchen? Clean up any spills right away. Avoid walking on wet floors. Keep items that you use a lot in easy-to-reach places. If you need to reach something above you, use a strong step stool that has a grab bar. Keep electrical cords out of the way. Do not use floor polish or wax that makes floors slippery. If you must use wax, use non-skid floor wax. Do not have throw rugs and other things on the floor that can make you trip. What can I do with my  stairs? Do not leave any items on the stairs. Make sure that there are handrails on both sides of the stairs and use them. Fix handrails that are broken or loose. Make sure that handrails are as long as the stairways. Check any carpeting to make sure that it is firmly attached to the stairs. Fix any carpet that is loose or worn. Avoid having throw rugs at the top or bottom of the stairs. If you do have throw rugs, attach them to the floor with carpet tape. Make sure that you have a light switch at the top of the stairs and the bottom of the stairs. If you do not have them, ask someone to add them for you. What else can I do to help prevent falls? Wear shoes that: Do not have high heels. Have rubber bottoms. Are comfortable and fit you well. Are closed at the toe. Do not wear sandals. If you use a stepladder: Make sure that it is fully opened. Do not climb a closed stepladder. Make  sure that both sides of the stepladder are locked into place. Ask someone to hold it for you, if possible. Clearly mark and make sure that you can see: Any grab bars or handrails. First and last steps. Where the edge of each step is. Use tools that help you move around (mobility aids) if they are needed. These include: Canes. Walkers. Scooters. Crutches. Turn on the lights when you go into a dark area. Replace any light bulbs as soon as they burn out. Set up your furniture so you have a clear path. Avoid moving your furniture around. If any of your floors are uneven, fix them. If there are any pets around you, be aware of where they are. Review your medicines with your doctor. Some medicines can make you feel dizzy. This can increase your chance of falling. Ask your doctor what other things that you can do to help prevent falls. This information is not intended to replace advice given to you by your health care provider. Make sure you discuss any questions you have with your health care provider. Document Released: 04/02/2009 Document Revised: 11/12/2015 Document Reviewed: 07/11/2014 Elsevier Interactive Patient Education  2017 [employer].

## 2022-12-15 ENCOUNTER — Telehealth: Payer: Self-pay | Admitting: Internal Medicine

## 2022-12-15 NOTE — Telephone Encounter (Signed)
Requesting: Lyrica 200mg   Contract:None UDS: None Last Visit: 10/26/22 Next Visit: 02/27/23 Last Refill: 07/27/22 #60 and 3RF  Please Advise

## 2022-12-15 NOTE — Telephone Encounter (Signed)
PDMP okay, Rx sent 

## 2022-12-21 ENCOUNTER — Other Ambulatory Visit: Payer: Self-pay | Admitting: Internal Medicine

## 2022-12-30 DIAGNOSIS — M5431 Sciatica, right side: Secondary | ICD-10-CM | POA: Diagnosis not present

## 2022-12-30 DIAGNOSIS — M4807 Spinal stenosis, lumbosacral region: Secondary | ICD-10-CM | POA: Diagnosis not present

## 2022-12-30 DIAGNOSIS — M1711 Unilateral primary osteoarthritis, right knee: Secondary | ICD-10-CM | POA: Diagnosis not present

## 2023-01-12 DIAGNOSIS — M1711 Unilateral primary osteoarthritis, right knee: Secondary | ICD-10-CM | POA: Diagnosis not present

## 2023-01-12 DIAGNOSIS — M25561 Pain in right knee: Secondary | ICD-10-CM | POA: Diagnosis not present

## 2023-02-08 ENCOUNTER — Encounter: Payer: Self-pay | Admitting: Internal Medicine

## 2023-02-10 ENCOUNTER — Encounter: Payer: Self-pay | Admitting: Internal Medicine

## 2023-02-27 ENCOUNTER — Encounter: Payer: Self-pay | Admitting: Internal Medicine

## 2023-02-27 ENCOUNTER — Ambulatory Visit (INDEPENDENT_AMBULATORY_CARE_PROVIDER_SITE_OTHER): Payer: PPO | Admitting: Internal Medicine

## 2023-02-27 VITALS — BP 132/68 | HR 81 | Temp 97.8°F | Resp 16 | Ht 70.0 in | Wt 229.4 lb

## 2023-02-27 DIAGNOSIS — I10 Essential (primary) hypertension: Secondary | ICD-10-CM | POA: Diagnosis not present

## 2023-02-27 DIAGNOSIS — E1169 Type 2 diabetes mellitus with other specified complication: Secondary | ICD-10-CM

## 2023-02-27 NOTE — Assessment & Plan Note (Signed)
DM: Last A1c increased, on diet control. We had a long conversation about a healthy diet with less carbohydrates and more fruits and vegetables.  Also encouraged regular walking.  Patient seems open to the ideas.  Check A1c. HTN: BP is very good, cContinue losartan.  Check a BMP. Falls: Possibly multifactorial including neuropathy, lack of exercise (cannot exercise much due to chronic back pain). Offered  PT >> declined . Encouraged use of a cane and stay active. Low O2 sat: O2 sat is slightly low, no symptoms, recheck regularly. Vaccine advice: Flu shot, COVID booster, plans to get them at the pharmacy. RTC 4 months

## 2023-02-27 NOTE — Patient Instructions (Addendum)
Vaccines I recommend: Covid booster- new this fall Flu shot this fall RSV vaccine  Per our records you are due for your diabetic eye exam. Please contact your eye doctor to schedule an appointment. Please have them send copies of your office visit notes to [place]. Our fax number is [phone]. If you need a referral to an eye doctor please let us know.  Check the  blood pressure regularly Blood pressure goal:  between 110/65 and  135/85. If it is consistently higher or lower, let me know    GO TO THE LAB : Get the blood work     GO TO THE FRONT DESK, PLEASE SCHEDULE YOUR APPOINTMENTS Come back for a checkup in 4 months

## 2023-02-27 NOTE — Progress Notes (Signed)
Subjective:    Patient ID: Bradley Navarro, male    DOB: [birth year], 82 y.o.   MRN: [id number]  DOS:  02/27/2023 Type of visit - description: Follow-up  We addressed all his chronic medical problems. In general feeling well. Had a fall 2 weeks ago, no major injury. Denies headache, dizziness.  No diplopia or slurred speech.  O2 sat is slightly low. He denies fever chills. No cough or difficulty breathing.  Review of Systems See above   Past Medical History:  Diagnosis Date   Chronic back pain    Elevated BP without diagnosis of hypertension 04/24/2017   History of headache    frontal lobe cluster headaches   History of kidney stones    Post laminectomy syndrome    SCC (squamous cell carcinoma) 01/2020   Sciatica of right side 2016   S/P L5-S1 revision lam/PSF on 04-10-12 w/ relief of RLE pain post op, MRI 01/2014 shows minimal foraminal stenosis at L4-5 w/o recurrent stenosis at L5-S1    Past Surgical History:  Procedure Laterality Date   APPENDECTOMY     LUMBAR DISC SURGERY  2012   L-5, Dr Elizabeth Sauer   LUMBAR FUSION  2015   rod, plates   r arm surger      Current Outpatient Medications  Medication Instructions   atorvastatin (LIPITOR) 10 mg, Oral, Daily at bedtime   Calcium Polycarbophil (FIBER-CAPS PO) 1 capsule, Oral, Daily   losartan (COZAAR) 100 mg, Oral, Daily   morphine (MS CONTIN) 30 mg, Oral, 2 times daily   Multiple Vitamin (MULITIVITAMIN WITH MINERALS) TABS 1 tablet, Daily   omeprazole (PRILOSEC) 20 mg, Oral, 2 times daily   polyethylene glycol (MIRALAX / GLYCOLAX) 17 g, Oral, Every other day   pregabalin (LYRICA) 200 mg, Oral, 2 times daily   tamsulosin (FLOMAX) 0.4 mg, Oral, Daily   Vitamin D (Cholecalciferol) 2,000 Units, Oral, Daily       Objective:   Physical Exam BP 132/68   Pulse 81   Temp 97.8 F (36.6 C) (Oral)   Resp 16   Ht 5\' 10"  (1.778 m)   Wt 229 lb 6 oz (104 kg)   SpO2 92%   BMI 32.91 kg/m  General:   Well developed, NAD,  BMI noted. HEENT:  Normocephalic . Face symmetric, atraumatic Lungs:  CTA B Normal respiratory effort, no intercostal retractions, no accessory muscle use. Heart: RRR,  no murmur.  Lower extremities: no pretibial edema bilaterally  Skin: Not pale. Not jaundice Neurologic:  alert & oriented X3.  Speech normal, gait appropriate for age and unassisted Psych--  Cognition and judgment appear intact.  Cooperative with normal attention span and concentration.  Behavior appropriate. No anxious or depressed appearing.      Assessment    Assessment DM (A1c increased to 6.9 on 07-2020) DM neuropathy HTN High cholesterol h/o HA-- s/p extensive w/u in the past. Has seen Dr Richardean Chimera and neuro @ [facility]   h/o kidney stones , multiple Chronic back pain-- sees pain management, see surgeries Chronic hoarseness: Never seen by ENT Sees dermatology [facility] Abnormal EKG: TWI, RBBB, see note from 05-2018 BPH, LUTS: Saw urology, last visit 2020.   PLAN DM: Last A1c increased, on diet control. We had a long conversation about a healthy diet with less carbohydrates and more fruits and vegetables.  Also encouraged regular walking.  Patient seems open to the ideas.  Check A1c. HTN: BP is very good, cContinue losartan.  Check a BMP. Falls: Possibly multifactorial  including neuropathy, lack of exercise (cannot exercise much due to chronic back pain). Offered  PT >> declined . Encouraged use of a cane and stay active. Low O2 sat: O2 sat is slightly low, no symptoms, recheck regularly. Vaccine advice: Flu shot, COVID booster, plans to get them at the pharmacy. RTC 4 months

## 2023-02-28 LAB — BASIC METABOLIC PANEL
BUN: 25 mg/dL — ABNORMAL HIGH (ref 6–23)
CO2: 27 meq/L (ref 19–32)
Calcium: 9 mg/dL (ref 8.4–10.5)
Chloride: 105 meq/L (ref 96–112)
Creatinine, Ser: 1.06 mg/dL (ref 0.40–1.50)
GFR: 66.05 mL/min (ref >60.00)
Glucose, Bld: 125 mg/dL — ABNORMAL HIGH (ref 70–99)
Potassium: 4.4 meq/L (ref 3.5–5.1)
Sodium: 139 meq/L (ref 135–145)

## 2023-02-28 LAB — HEMOGLOBIN A1C: Hgb A1c MFr Bld: 6.5 % (ref 4.6–6.5)

## 2023-03-03 DIAGNOSIS — G894 Chronic pain syndrome: Secondary | ICD-10-CM | POA: Diagnosis not present

## 2023-03-03 DIAGNOSIS — M1711 Unilateral primary osteoarthritis, right knee: Secondary | ICD-10-CM | POA: Diagnosis not present

## 2023-03-03 DIAGNOSIS — Z79899 Other long term (current) drug therapy: Secondary | ICD-10-CM | POA: Diagnosis not present

## 2023-03-03 DIAGNOSIS — M4807 Spinal stenosis, lumbosacral region: Secondary | ICD-10-CM | POA: Diagnosis not present

## 2023-03-03 DIAGNOSIS — M5431 Sciatica, right side: Secondary | ICD-10-CM | POA: Diagnosis not present

## 2023-03-03 DIAGNOSIS — Z79891 Long term (current) use of opiate analgesic: Secondary | ICD-10-CM | POA: Diagnosis not present

## 2023-03-11 ENCOUNTER — Other Ambulatory Visit: Payer: Self-pay | Admitting: Internal Medicine

## 2023-05-05 DIAGNOSIS — M5431 Sciatica, right side: Secondary | ICD-10-CM | POA: Diagnosis not present

## 2023-05-05 DIAGNOSIS — M4807 Spinal stenosis, lumbosacral region: Secondary | ICD-10-CM | POA: Diagnosis not present

## 2023-05-05 DIAGNOSIS — M1711 Unilateral primary osteoarthritis, right knee: Secondary | ICD-10-CM | POA: Diagnosis not present

## 2023-05-16 ENCOUNTER — Telehealth: Payer: Self-pay | Admitting: Internal Medicine

## 2023-05-16 NOTE — Telephone Encounter (Signed)
Requesting: Lyrica 200mg  Contract: Under contract w/ pain med UDS: Under contract w/ pain med Last Visit: 02/27/23 Next Visit: 06/30/23 Last Refill: 12/15/22 #60 and 4RF  Please Advise

## 2023-05-16 NOTE — Telephone Encounter (Signed)
PDMP okay, Rx sent 

## 2023-06-18 ENCOUNTER — Other Ambulatory Visit: Payer: Self-pay | Admitting: Internal Medicine

## 2023-06-29 ENCOUNTER — Encounter: Payer: Self-pay | Admitting: Internal Medicine

## 2023-06-30 ENCOUNTER — Ambulatory Visit: Payer: PPO | Admitting: Internal Medicine

## 2023-06-30 DIAGNOSIS — M5431 Sciatica, right side: Secondary | ICD-10-CM | POA: Diagnosis not present

## 2023-06-30 DIAGNOSIS — M4807 Spinal stenosis, lumbosacral region: Secondary | ICD-10-CM | POA: Diagnosis not present

## 2023-07-04 ENCOUNTER — Encounter: Payer: Self-pay | Admitting: Internal Medicine

## 2023-07-04 ENCOUNTER — Ambulatory Visit (INDEPENDENT_AMBULATORY_CARE_PROVIDER_SITE_OTHER): Payer: PPO | Admitting: Internal Medicine

## 2023-07-04 VITALS — BP 126/64 | HR 76 | Temp 98.1°F | Resp 16 | Ht 70.0 in | Wt 220.1 lb

## 2023-07-04 DIAGNOSIS — E7800 Pure hypercholesterolemia, unspecified: Secondary | ICD-10-CM

## 2023-07-04 DIAGNOSIS — E1169 Type 2 diabetes mellitus with other specified complication: Secondary | ICD-10-CM

## 2023-07-04 DIAGNOSIS — I10 Essential (primary) hypertension: Secondary | ICD-10-CM | POA: Diagnosis not present

## 2023-07-04 NOTE — Patient Instructions (Addendum)
 Check the  blood pressure regularly Blood pressure goal:  between 110/65 and  135/85. If it is consistently higher or lower, let me know     GO TO THE LAB : Get the blood work     Next visit with me 4 months, physical exam Please schedule it at the front desk   Per our records you are due for your diabetic eye exam. Please contact your eye doctor to schedule an appointment. Please have them send copies of your office visit notes to us . Our fax number is [phone]. If you need a referral to an eye doctor please let us  know.  Diabetes Mellitus and Foot Care Diabetes, also called diabetes mellitus, may cause problems with your feet and legs because of poor blood flow (circulation). Poor circulation may make your skin: Become thinner and drier. Break more easily. Heal more slowly. Peel and crack. You may also have nerve damage (neuropathy). This can cause decreased feeling in your legs and feet. This means that you may not notice minor injuries to your feet that could lead to more serious problems. Finding and treating problems early is the best way to prevent future foot problems. How to care for your feet Foot hygiene  Wash your feet daily with warm water and mild soap. Do not use hot water. Then, pat your feet and the areas between your toes until they are fully dry. Do not soak your feet. This can dry your skin. Trim your toenails straight across. Do not dig under them or around the cuticle. File the edges of your nails with an emery board or nail file. Apply a moisturizing lotion or petroleum jelly to the skin on your feet and to dry, brittle toenails. Use lotion that does not contain alcohol and is unscented. Do not apply lotion between your toes. Shoes and socks Wear clean socks or stockings every day. Make sure they are not too tight. Do not wear knee-high stockings. These may decrease blood flow to your legs. Wear shoes that fit well and have enough cushioning. Always look  in your shoes before you put them on to be sure there are no objects inside. To break in new shoes, wear them for just a few hours a day. This prevents injuries on your feet. Wounds, scrapes, corns, and calluses  Check your feet daily for blisters, cuts, bruises, sores, and redness. If you cannot see the bottom of your feet, use a mirror or ask someone for help. Do not cut off corns or calluses or try to remove them with medicine. If you find a minor scrape, cut, or break in the skin on your feet, keep it and the skin around it clean and dry. You may clean these areas with mild soap and water. Do not clean the area with peroxide, alcohol, or iodine. If you have a wound, scrape, corn, or callus on your foot, look at it several times a day to make sure it is healing and not infected. Check for: Redness, swelling, or pain. Fluid or blood. Warmth. Pus or a bad smell. General tips Do not cross your legs. This may decrease blood flow to your feet. Do not use heating pads or hot water bottles on your feet. They may burn your skin. If you have lost feeling in your feet or legs, you may not know this is happening until it is too late. Protect your feet from hot and cold by wearing shoes, such as at the beach or on  hot pavement. Schedule a complete foot exam at least once a year or more often if you have foot problems. Report any cuts, sores, or bruises to your health care provider right away. Where to find more information American Diabetes Association: diabetes.org Association of Diabetes Care & Education Specialists: diabeteseducator.org Contact a health care provider if: You have a condition that increases your risk of infection, and you have any cuts, sores, or bruises on your feet. You have an injury that is not healing. You have redness on your legs or feet. You feel burning or tingling in your legs or feet. You have pain or cramps in your legs and feet. Your legs or feet are numb. Your feet  always feel cold. You have pain around any toenails. Get help right away if: You have a wound, scrape, corn, or callus on your foot and: You have signs of infection. You have a fever. You have a red line going up your leg. This information is not intended to replace advice given to you by your health care provider. Make sure you discuss any questions you have with your health care provider. Document Revised: 12/08/2021 Document Reviewed: 12/08/2021 Elsevier Patient Education  2024 [employer].

## 2023-07-04 NOTE — Progress Notes (Signed)
 Subjective:    Patient ID: Bradley Navarro, male    DOB: [birth year], 83 y.o.   MRN: [id number]  DOS:  07/04/2023 Type of visit - description: rov  Since the last office visit is feeling well.  Changed her diet, + weight loss.  Feels good. Neuropathy symptoms at baseline. No recent falls.  Wt Readings from Last 3 Encounters:  07/04/23 220 lb 2 oz (99.8 kg)  02/27/23 229 lb 6 oz (104 kg)  10/26/22 228 lb (103.4 kg)   Review of Systems See above   Past Medical History:  Diagnosis Date   Chronic back pain    Elevated BP without diagnosis of hypertension 04/24/2017   History of headache    frontal lobe cluster headaches   History of kidney stones    Post laminectomy syndrome    SCC (squamous cell carcinoma) 01/2020   Sciatica of right side 2016   S/P L5-S1 revision lam/PSF on 04-10-12 w/ relief of RLE pain post op, MRI 01/2014 shows minimal foraminal stenosis at L4-5 w/o recurrent stenosis at L5-S1    Past Surgical History:  Procedure Laterality Date   APPENDECTOMY     LUMBAR DISC SURGERY  2012   L-5, Dr Larene   LUMBAR FUSION  2015   rod, plates   r arm surger      Current Outpatient Medications  Medication Instructions   atorvastatin  (LIPITOR) 10 mg, Oral, Daily at bedtime   Calcium  Polycarbophil (FIBER-CAPS PO) 1 capsule, Daily   losartan  (COZAAR ) 100 mg, Oral, Daily   morphine  (MS CONTIN ) 30 mg, 2 times daily   Multiple Vitamin (MULITIVITAMIN WITH MINERALS) TABS 1 tablet, Daily   omeprazole  (PRILOSEC) 20 mg, Oral, 2 times daily   polyethylene glycol (MIRALAX / GLYCOLAX) 17 g, Every other day   pregabalin  (LYRICA ) 200 mg, Oral, 2 times daily   tamsulosin  (FLOMAX ) 0.4 mg, Daily   Vitamin D  (Cholecalciferol) 2,000 Units, Daily       Objective:   Physical Exam BP 126/64   Pulse 76   Temp 98.1 F (36.7 C) (Oral)   Resp 16   Ht 5' 10 (1.778 m)   Wt 220 lb 2 oz (99.8 kg)   SpO2 97%   BMI 31.58 kg/m  General:   Well developed, NAD, BMI noted. HEENT:   Normocephalic . Face symmetric, atraumatic Lungs:  CTA B Normal respiratory effort, no intercostal retractions, no accessory muscle use. Heart: RRR,  no murmur.  DM foot exam: Skin normal, + pedal pulses, good capillary refill, pinprick examination: Decreased sensitivity Skin: Not pale. Not jaundice Neurologic:  alert & oriented X3.  Speech normal, gait somewhat limited by neuropathy Psych--  Cognition and judgment appear intact.  Cooperative with normal attention span and concentration.  Behavior appropriate. No anxious or depressed appearing.      Assessment    Assessment DM (A1c increased to 6.9 on 07-2020) DM neuropathy HTN High cholesterol h/o HA-- s/p extensive w/u in the past. Has seen Dr Gailen and neuro @ [facility]   h/o kidney stones , multiple Chronic back pain-- sees pain management, see surgeries Chronic hoarseness: Never seen by ENT Sees dermatology [facility] Abnormal EKG: TWI, RBBB, see note from 05-2018 BPH, LUTS: Saw urology, last visit 2020.  PLAN DM: Has definitely improve his diet, + weight loss, check A1c and micro.  On no meds. Neuropathy: Feet care information provided,   On Lyrica .  Stable. Falls: No falls since last visit. High cholesterol: Not fasting, on atorvastatin , check  a lipid panel. Preventive care: Had a flu and COVID-vaccine. RTC 4 months CPX

## 2023-07-04 NOTE — Assessment & Plan Note (Signed)
 DM: Has definitely improve his diet, + weight loss, check A1c and micro.  On no meds. Neuropathy: Feet care information provided,   On Lyrica .  Stable. Falls: No falls since last visit. High cholesterol: Not fasting, on atorvastatin , check a lipid panel. Preventive care: Had a flu and COVID-vaccine. RTC 4 months CPX

## 2023-07-05 LAB — LIPID PANEL
Cholesterol: 123 mg/dL (ref 0–200)
HDL: 31.2 mg/dL — ABNORMAL LOW (ref >39.00)
LDL Cholesterol: 58 mg/dL (ref 0–99)
NonHDL: 91.52
Total CHOL/HDL Ratio: 4
Triglycerides: 166 mg/dL — ABNORMAL HIGH (ref 0.0–149.0)
VLDL: 33.2 mg/dL (ref 0.0–40.0)

## 2023-07-05 LAB — MICROALBUMIN / CREATININE URINE RATIO
Creatinine,U: 237.9 mg/dL
Microalb Creat Ratio: 0.7 mg/g (ref 0.0–30.0)
Microalb, Ur: 1.6 mg/dL (ref 0.0–1.9)

## 2023-07-05 LAB — HEMOGLOBIN A1C: Hgb A1c MFr Bld: 6.5 % (ref 4.6–6.5)

## 2023-09-01 DIAGNOSIS — M5431 Sciatica, right side: Secondary | ICD-10-CM | POA: Diagnosis not present

## 2023-09-07 ENCOUNTER — Other Ambulatory Visit: Payer: Self-pay | Admitting: Internal Medicine

## 2023-09-27 ENCOUNTER — Encounter: Payer: Self-pay | Admitting: Internal Medicine

## 2023-10-11 DIAGNOSIS — L209 Atopic dermatitis, unspecified: Secondary | ICD-10-CM | POA: Diagnosis not present

## 2023-10-11 DIAGNOSIS — C44612 Basal cell carcinoma of skin of right upper limb, including shoulder: Secondary | ICD-10-CM | POA: Diagnosis not present

## 2023-10-11 DIAGNOSIS — L570 Actinic keratosis: Secondary | ICD-10-CM | POA: Diagnosis not present

## 2023-10-16 ENCOUNTER — Telehealth: Payer: Self-pay | Admitting: Internal Medicine

## 2023-10-16 NOTE — Telephone Encounter (Signed)
 Requesting: Lyrica  200mg   Contract: under contract w/ pain medicine UDS: under contract w/ pain medicine Last Visit: 07/04/23 Next Visit: 10/30/23 Last Refill: 05/16/23 #60 and 4RF   Please Advise

## 2023-10-16 NOTE — Telephone Encounter (Signed)
 PDMP okay, Rx sent

## 2023-10-30 ENCOUNTER — Encounter: Payer: Self-pay | Admitting: Internal Medicine

## 2023-10-30 ENCOUNTER — Ambulatory Visit: Payer: PPO | Admitting: Internal Medicine

## 2023-10-30 VITALS — BP 136/72 | HR 72 | Temp 98.0°F | Resp 18 | Ht 70.0 in | Wt 228.4 lb

## 2023-10-30 DIAGNOSIS — E1169 Type 2 diabetes mellitus with other specified complication: Secondary | ICD-10-CM | POA: Diagnosis not present

## 2023-10-30 DIAGNOSIS — E7800 Pure hypercholesterolemia, unspecified: Secondary | ICD-10-CM

## 2023-10-30 DIAGNOSIS — I10 Essential (primary) hypertension: Secondary | ICD-10-CM | POA: Diagnosis not present

## 2023-10-30 DIAGNOSIS — E119 Type 2 diabetes mellitus without complications: Secondary | ICD-10-CM

## 2023-10-30 DIAGNOSIS — Z0000 Encounter for general adult medical examination without abnormal findings: Secondary | ICD-10-CM | POA: Diagnosis not present

## 2023-10-30 DIAGNOSIS — E038 Other specified hypothyroidism: Secondary | ICD-10-CM | POA: Diagnosis not present

## 2023-10-30 NOTE — Patient Instructions (Signed)
 INSTRUCTIONS  FOR TODAY  Please read information about the healthcare power of attorney below.  Vaccines to consider: Shingrix  RSV A flu shot every fall  Check the  blood pressure regularly Blood pressure goal:  between 110/65 and  135/85. If it is consistently higher or lower, let me know     GO TO THE LAB : Get the blood work     Next office visit for a checkup in 6 months Please make an appointment before you leave today       "Health Care Power of attorney" (Also know as a  "Living will" or  Advance care planning documents)  If you already have a living will or healthcare power of attorney, is recommended you bring the copy to be scanned in your chart.   The document will be available to all the doctors you see in the system.  If you are over 44 y/o and don't have the document, please read:  Advance care planning is a process that supports adults in  understanding and sharing their preferences regarding future medical care.  The patient's preferences are recorded in documents called Advance Directives and the can be modified at any time while the patient is in full mental capacity.     More information at: [email or web address]

## 2023-10-30 NOTE — Progress Notes (Unsigned)
 Subjective:    Patient ID: Bradley Navarro, male    DOB: 06-25-1940, 83 y.o.   MRN: [id number]  DOS:  10/30/2023 Type of visit - description: CPX  Here for CPX, doing well, has no concerns  Review of Systems See above   Past Medical History:  Diagnosis Date   Chronic back pain    Elevated BP without diagnosis of hypertension 04/24/2017   History of headache    frontal lobe cluster headaches   History of kidney stones    Post laminectomy syndrome    SCC (squamous cell carcinoma) 01/2020   Sciatica of right side 2016   S/P L5-S1 revision lam/PSF on 04-10-12 w/ relief of RLE pain post op, MRI 01/2014 shows minimal foraminal stenosis at L4-5 w/o recurrent stenosis at L5-S1    Past Surgical History:  Procedure Laterality Date   APPENDECTOMY     LUMBAR DISC SURGERY  2012   L-5, Dr Joesph Mussel   LUMBAR FUSION  2015   rod, plates   r arm surger      Current Outpatient Medications  Medication Instructions   atorvastatin  (LIPITOR) 10 MG tablet TAKE 1 TABLET BY MOUTH EVERYDAY AT BEDTIME   Calcium  Polycarbophil (FIBER-CAPS PO) 1 capsule, Daily   losartan  (COZAAR ) 100 mg, Oral, Daily   morphine  (MS CONTIN ) 30 mg, 2 times daily   Multiple Vitamin (MULITIVITAMIN WITH MINERALS) TABS 1 tablet, Daily   omeprazole  (PRILOSEC) 20 mg, Oral, 2 times daily   polyethylene glycol (MIRALAX / GLYCOLAX) 17 g, Every other day   pregabalin  (LYRICA ) 200 mg, Oral, 2 times daily   tamsulosin  (FLOMAX ) 0.4 mg, Daily   Vitamin D  (Cholecalciferol) 2,000 Units, Daily       Objective:   Physical Exam BP 136/72   Pulse 72   Temp 98 F (36.7 C) (Oral)   Resp 18   Ht 5\' 10"  (1.778 m)   Wt 228 lb 6 oz (103.6 kg)   SpO2 96%   BMI 32.77 kg/m  General: Well developed, NAD, BMI noted Neck: No  thyromegaly  HEENT:  Normocephalic . Face symmetric, atraumatic Lungs:  CTA B Normal respiratory effort, no intercostal retractions, no accessory muscle use. Heart: RRR,  no murmur.  Abdomen:  Not  distended, soft, non-tender. No rebound or rigidity.   Lower extremities: no pretibial edema bilaterally  Skin: Exposed areas without rash. Not pale. Not jaundice Neurologic:  alert & oriented X3.  Speech normal, gait appropriate for age and unassisted Strength symmetric and appropriate for age.  Psych: Cognition and judgment appear intact.  Cooperative with normal attention span and concentration.  Behavior appropriate. No anxious or depressed appearing.     Assessment     Assessment DM (A1c increased to 6.9 on 07-2020) DM neuropathy HTN High cholesterol h/o HA-- s/p extensive w/u in the past. Has seen Dr Sinda Duel and neuro @ [facility]   h/o kidney stones , multiple Chronic back pain-- sees pain management, see surgeries Chronic hoarseness: Never seen by ENT Sees dermatology [facility] Abnormal EKG: TWI, RBBB, see note from 05-2018 BPH, LUTS: Saw urology, last visit 2020.  PLAN Here for CPX -Td 2016 - pnm 23: 2011 &  07-2020.  Prevnar 2016 - had a covid booster late 2024 per pt  - Vaccines I recommend: Shingrix , RSV,   flu shot every fall -CCS:  Never had a colonoscopy,  not interested in CCS  -Prostate cancer screening: no further screenings  - labs: See orders -Diet and exercise: Encouraged to  remain active. - Healthcare Power of attorney see AVS. Other issues addressed today: DM: Diet controlled, check A1c. HTN: Well-controlled on losartan , check a CMP and CBC High cholesterol: Controlled on atorvastatin . Subclinical hypothyroidism: No symptoms, check TFTs.  Consider meds. RTC 6 months

## 2023-10-31 ENCOUNTER — Encounter: Payer: Self-pay | Admitting: Internal Medicine

## 2023-10-31 LAB — CBC WITH DIFFERENTIAL/PLATELET
Basophils Absolute: 0.1 10*3/uL (ref 0.0–0.1)
Basophils Relative: 0.9 % (ref 0.0–3.0)
Eosinophils Absolute: 0.2 10*3/uL (ref 0.0–0.7)
Eosinophils Relative: 3.8 % (ref 0.0–5.0)
HCT: 39.4 % (ref 39.0–52.0)
Hemoglobin: 13.2 g/dL (ref 13.0–17.0)
Lymphocytes Relative: 37 % (ref 12.0–46.0)
Lymphs Abs: 2 10*3/uL (ref 0.7–4.0)
MCHC: 33.5 g/dL (ref 30.0–36.0)
MCV: 93.4 fl (ref 78.0–100.0)
Monocytes Absolute: 0.5 10*3/uL (ref 0.1–1.0)
Monocytes Relative: 9.3 % (ref 3.0–12.0)
Neutro Abs: 2.6 10*3/uL (ref 1.4–7.7)
Neutrophils Relative %: 49 % (ref 43.0–77.0)
Platelets: 177 10*3/uL (ref 150.0–400.0)
RBC: 4.21 Mil/uL — ABNORMAL LOW (ref 4.22–5.81)
RDW: 13.7 % (ref 11.5–15.5)
WBC: 5.4 10*3/uL (ref 4.0–10.5)

## 2023-10-31 LAB — COMPREHENSIVE METABOLIC PANEL WITH GFR
ALT: 14 U/L (ref 0–53)
AST: 20 U/L (ref 0–37)
Albumin: 4.3 g/dL (ref 3.5–5.2)
Alkaline Phosphatase: 64 U/L (ref 39–117)
BUN: 23 mg/dL (ref 6–23)
CO2: 28 meq/L (ref 19–32)
Calcium: 9.1 mg/dL (ref 8.4–10.5)
Chloride: 104 meq/L (ref 96–112)
Creatinine, Ser: 1.01 mg/dL (ref 0.40–1.50)
GFR: 69.66 mL/min (ref >60.00)
Glucose, Bld: 141 mg/dL — ABNORMAL HIGH (ref 70–99)
Potassium: 4.6 meq/L (ref 3.5–5.1)
Sodium: 139 meq/L (ref 135–145)
Total Bilirubin: 1 mg/dL (ref 0.2–1.2)
Total Protein: 6.4 g/dL (ref 6.0–8.3)

## 2023-10-31 LAB — HEMOGLOBIN A1C: Hgb A1c MFr Bld: 6.4 % (ref 4.6–6.5)

## 2023-10-31 LAB — T4, FREE: Free T4: 0.63 ng/dL (ref 0.60–1.60)

## 2023-10-31 LAB — TSH: TSH: 6.8 u[IU]/mL — ABNORMAL HIGH (ref 0.35–5.50)

## 2023-10-31 NOTE — Assessment & Plan Note (Signed)
 Here for CPX -Td 2016 - pnm 23: 2011 &  07-2020.  Prevnar 2016 - had a covid booster late 2024 per pt  - Vaccines I recommend: Shingrix , RSV,   flu shot every fall -CCS:  Never had a colonoscopy,  not interested in CCS  -Prostate cancer screening: no further screenings  - labs: See orders -Diet and exercise: Encouraged to remain active. - Healthcare Power of attorney see AVS.

## 2023-10-31 NOTE — Assessment & Plan Note (Signed)
 Here for CPX  Other issues addressed today: DM: Diet controlled, check A1c. HTN: Well-controlled on losartan , check a CMP and CBC High cholesterol: Controlled on atorvastatin . Subclinical hypothyroidism: No symptoms, check TFTs.  Consider meds. RTC 6 months

## 2023-11-01 ENCOUNTER — Ambulatory Visit: Payer: Self-pay | Admitting: Internal Medicine

## 2023-11-03 DIAGNOSIS — M5416 Radiculopathy, lumbar region: Secondary | ICD-10-CM | POA: Diagnosis not present

## 2023-11-03 DIAGNOSIS — G894 Chronic pain syndrome: Secondary | ICD-10-CM | POA: Diagnosis not present

## 2023-11-03 DIAGNOSIS — Z1330 Encounter for screening examination for mental health and behavioral disorders, unspecified: Secondary | ICD-10-CM | POA: Diagnosis not present

## 2023-11-03 NOTE — Progress Notes (Signed)
  Subjective:    Bradley Navarro is a 83 y.o. (DOB 21-Sep-1940) male.  No chief complaint on file.    HPI 83 year old male status post L5-S1 revision laminectomy/PSF on 04/10/2012 with initial excellent relief of discomfort.  Patient then did have low back pain returning and radiating into the right lower extremity. Currently stable with medical treatment using MS Contin  for pain, tizanidine as needed.  He uses a topically applied compound cream as needed to help control portion of the symptoms.    Reviewed and updated this visit by provider: None       Review of Systems    Objective:  There were no vitals filed for this visit. Physical Exam Gait is compensated mildly antalgic Bilateral lower extremity range of motion within functional notes Pain in the right lower lumbosacral paraspinals buttocks and down the right lower extremity in S1 versus L5 pattern     Assessment / Plan:   Assessment  Lumbar MRI 2017 with solid L5-S1 fusion Lumbar x-ray 12/14/2018 revealing L5-S1 fusion intact no fracture or spondylolisthesis.  Declined SCS trial in the past.  He wears an LSO at times.  He uses a cane for ambulation as needed.  Right S1 SNRB in the past with only short-term relief.  Chronic neuropathy in his feet with burning pain treated with capsaicin topically and Lyrica .  Also discussed [employer] PNS as in the past in addition to this current medical treatment but he declines. UDT 12/24 appropriate.  New UDT today.  Stable on current medications. Plan  Will refill his medication today.  He is stable on his current dosages.  He will follow-up in 2 months for reassessment.  PDMP checked today and is appropriate.  DVT performed today as well  Risks, benefits, and alternatives of the medications and treatment plan prescribed today were discussed, and patient expressed understanding. Plan follow-up as discussed or as needed if any worsening symptoms or change in condition.

## 2023-11-29 DIAGNOSIS — H52223 Regular astigmatism, bilateral: Secondary | ICD-10-CM | POA: Diagnosis not present

## 2023-11-29 DIAGNOSIS — H18513 Endothelial corneal dystrophy, bilateral: Secondary | ICD-10-CM | POA: Diagnosis not present

## 2023-11-29 DIAGNOSIS — H524 Presbyopia: Secondary | ICD-10-CM | POA: Diagnosis not present

## 2023-11-29 DIAGNOSIS — H5213 Myopia, bilateral: Secondary | ICD-10-CM | POA: Diagnosis not present

## 2023-11-29 DIAGNOSIS — E1165 Type 2 diabetes mellitus with hyperglycemia: Secondary | ICD-10-CM | POA: Diagnosis not present

## 2023-11-29 LAB — HM DIABETES EYE EXAM

## 2023-12-03 ENCOUNTER — Other Ambulatory Visit: Payer: Self-pay | Admitting: Internal Medicine

## 2023-12-07 ENCOUNTER — Ambulatory Visit: Payer: PPO | Admitting: *Deleted

## 2023-12-07 VITALS — Ht 70.0 in | Wt 228.0 lb

## 2023-12-07 DIAGNOSIS — Z0000 Encounter for general adult medical examination without abnormal findings: Secondary | ICD-10-CM

## 2023-12-07 NOTE — Patient Instructions (Signed)
 Mr. Carstens , Thank you for taking time out of your busy schedule to complete your Annual Wellness Visit with me. I enjoyed our conversation and look forward to speaking with you again next year. I, as well as your care team,  appreciate your ongoing commitment to your health goals. Please review the following plan we discussed and let me know if I can assist you in the future. Your Game plan/ To Do List     Follow up Visits: Next Medicare AWV with our clinical staff: 12/10/24 10:20   Next Office Visit with your provider: 05/01/24 1pm  Clinician Recommendations:  Aim for 30 minutes of exercise or brisk walking, 6-8 glasses of water, and 5 servings of fruits and vegetables each day.       This is a list of the screening recommended for you and due dates:  Health Maintenance  Topic Date Due   Medicare Annual Wellness Visit  12/06/2023   COVID-19 Vaccine (8 - Pfizer risk 2024-25 season) 12/27/2023*   Flu Shot  01/19/2024   Hemoglobin A1C  05/01/2024   Yearly kidney health urinalysis for diabetes  07/03/2024   Complete foot exam   07/03/2024   Yearly kidney function blood test for diabetes  10/29/2024   Eye exam for diabetics  11/28/2024   DTaP/Tdap/Td vaccine (2 - Tdap) 04/16/2025   Pneumococcal Vaccine for age over 82  Completed   HPV Vaccine  Aged Out   Meningitis B Vaccine  Aged Out   Hepatitis C Screening  Discontinued   Zoster (Shingles) Vaccine  Discontinued  *Topic was postponed. The date shown is not the original due date.    Advanced directives: (Copy Requested) Please bring a copy of your health care power of attorney and living will to the office to be added to your chart at your convenience. You can mail to [facility] [address] [place], [place] [address] or email to [email or web address] Advance Care Planning is important because it:  [x]  Makes sure you receive the medical care that is consistent with your values, goals, and preferences  [x]   It provides guidance to your family and loved ones and reduces their decisional burden about whether or not they are making the right decisions based on your wishes.  Follow the link provided in your after visit summary or read over the paperwork we have mailed to you to help you started getting your Advance Directives in place. If you need assistance in completing these, please reach out to us  so that we can help you!  See attachments for Healthy Eating tips.

## 2023-12-07 NOTE — Progress Notes (Signed)
 Please attest this visit in the absence of patient primary care provider.    Subjective:   Bradley Navarro is a 83 y.o. who presents for a Medicare Wellness preventive visit.  As a reminder, Annual Wellness Visits don't include a physical exam, and some assessments may be limited, especially if this visit is performed virtually. We may recommend an in-person follow-up visit with your provider if needed.  Visit Complete: Virtual I connected with  Leandra Pro on 12/07/23 by a audio enabled telemedicine application and verified that I am speaking with the correct person using two identifiers.  Patient Location: Home  Provider Location: Office/Clinic  I discussed the limitations of evaluation and management by telemedicine. The patient expressed understanding and agreed to proceed.  Vital Signs: Because this visit was a virtual/telehealth visit, some criteria may be missing or patient reported. Any vitals not documented were not able to be obtained and vitals that have been documented are patient reported.  VideoDeclined- This patient declined Librarian, academic. Therefore the visit was completed with audio only.  Persons Participating in Visit: Patient.  AWV Questionnaire: No: Patient Medicare AWV questionnaire was not completed prior to this visit.  Cardiac Risk Factors include: advanced age (>75men, >97 women);male gender;dyslipidemia;hypertension;diabetes mellitus;Other (see comment), Risk factor comments: CAD     Objective:    Today's Vitals   12/07/23 1027 12/07/23 1034  Weight: 228 lb (103.4 kg)   Height: 5' 10 (1.778 m)   PainSc:  5    Body mass index is 32.71 kg/m.     12/06/2022   10:19 AM 12/02/2021    9:44 AM 11/23/2020    9:51 AM 10/03/2019   11:07 AM 04/20/2015    8:25 AM  Advanced Directives  Does Patient Have a Medical Advance Directive? Yes Yes Yes Yes No   Type of Estate agent of [place];Living  will Healthcare Power of [place];Out of facility DNR (pink MOST or yellow form);Living will Healthcare Power of [place];Living will Healthcare Power of [place];Living will   Does patient want to make changes to medical advance directive? No - Patient declined No - Patient declined  No - Patient declined   Copy of Healthcare Power of Attorney in Chart? No - copy requested No - copy requested No - copy requested No - copy requested   Would patient like information on creating a medical advance directive?     Yes - Educational materials given      Data saved with a previous flowsheet row definition    Current Medications (verified) Outpatient Encounter Medications as of 12/07/2023  Medication Sig   atorvastatin  (LIPITOR) 10 MG tablet TAKE 1 TABLET BY MOUTH EVERYDAY AT BEDTIME   Calcium  Polycarbophil (FIBER-CAPS PO) Take 1 capsule by mouth daily.   losartan  (COZAAR ) 100 MG tablet Take 1 tablet (100 mg total) by mouth daily.   morphine  (MS CONTIN ) 30 MG 12 hr tablet Take 30 mg by mouth in the morning and at bedtime.    Multiple Vitamin (MULITIVITAMIN WITH MINERALS) TABS Take 1 tablet by mouth daily.   omeprazole  (PRILOSEC) 20 MG capsule Take 1 capsule (20 mg total) by mouth 2 (two) times daily. (Patient taking differently: Take 20 mg by mouth daily.)   polyethylene glycol (MIRALAX / GLYCOLAX) 17 g packet Take 17 g by mouth every other day.   pregabalin  (LYRICA ) 200 MG capsule TAKE 1 CAPSULE BY MOUTH TWICE A DAY   tamsulosin  (FLOMAX ) 0.4 MG CAPS capsule Take 0.4  mg by mouth daily.   Vitamin D , Cholecalciferol, 25 MCG (1000 UT) CAPS Take 2,000 Units by mouth daily.   No facility-administered encounter medications on file as of 12/07/2023.    Allergies (verified) Penicillins and Gabapentin   History: Past Medical History:  Diagnosis Date   Chronic back pain    Elevated BP without diagnosis of hypertension 04/24/2017   History of headache    frontal lobe cluster headaches   History of  kidney stones    Post laminectomy syndrome    SCC (squamous cell carcinoma) 01/2020   Sciatica of right side 2016   S/P L5-S1 revision lam/PSF on 04-10-12 w/ relief of RLE pain post op, MRI 01/2014 shows minimal foraminal stenosis at L4-5 w/o recurrent stenosis at L5-S1   Past Surgical History:  Procedure Laterality Date   APPENDECTOMY     LUMBAR DISC SURGERY  2012   L-5, Dr Joesph Mussel   LUMBAR FUSION  2015   rod, plates   r arm surger     Family History  Problem Relation Age of Onset   CAD Father 57       MI    Dementia Mother    Colon cancer Neg Hx    Prostate cancer Neg Hx    Diabetes Neg Hx    Social History   Socioeconomic History   Marital status: Widowed    Spouse name: Not on file   Number of children: 2   Years of education: Not on file   Highest education level: Not on file  Occupational History   Occupation: retired  Tobacco Use   Smoking status: Former   Smokeless tobacco: Never   Tobacco comments:    quit in the 90, light smoker   Substance and Sexual Activity   Alcohol use: No    Alcohol/week: 0.0 standard drinks of alcohol   Drug use: No   Sexual activity: Not on file  Other Topics Concern   Not on file  Social History Narrative   Lost wife 10-2021   children 2- [place]    5 g-children, one is very close to him, lives in [place]    ( 2 step daughters, does not see them)   Social Drivers of Health   Financial Resource Strain: Low Risk  (11/03/2023)   Received from [facility]   Overall Financial Resource Strain (CARDIA)    Difficulty of Paying Living Expenses: Not hard at all  Food Insecurity: No Food Insecurity (12/07/2023)   Hunger Vital Sign    Worried About Running Out of Food in the Last Year: Never true    Ran Out of Food in the Last Year: Never true  Transportation Needs: No Transportation Needs (12/07/2023)   PRAPARE - Administrator, Civil Service (Medical): No    Lack of Transportation (Non-Medical): No  Physical  Activity: Sufficiently Active (12/07/2023)   Exercise Vital Sign    Days of Exercise per Week: 7 days    Minutes of Exercise per Session: 30 min  Stress: No Stress Concern Present (12/07/2023)   [employer] of Occupational Health - Occupational Stress Questionnaire    Feeling of Stress: Only a little  Recent Concern: Stress - Stress Concern Present (12/07/2023)   [employer] of Occupational Health - Occupational Stress Questionnaire    Feeling of Stress: To some extent  Social Connections: Moderately Integrated (12/07/2023)   Social Connection and Isolation Panel    Frequency of Communication with Friends and Family: More than three  times a week    Frequency of Social Gatherings with Friends and Family: More than three times a week    Attends Religious Services: More than 4 times per year    Active Member of [employer] or Organizations: Yes    Attends Banker Meetings: Never    Marital Status: Widowed    Tobacco Counseling Counseling given: Not Answered Tobacco comments: quit in the 90, light smoker     Clinical Intake:  Pre-visit preparation completed: Yes  Pain : 0-10 Pain Score: 5  Pain Type: Chronic pain Pain Location: Back Pain Relieving Factors: medication  Pain Relieving Factors: medication  BMI - recorded: 32.71 Nutritional Status: BMI > 30  Obese Diabetes: Yes CBG done?: No Did pt. bring in CBG monitor from home?: No  Lab Results  Component Value Date   HGBA1C 6.4 10/30/2023   HGBA1C 6.5 07/04/2023   HGBA1C 6.5 02/27/2023     How often do you need to have someone help you when you read instructions, pamphlets, or other written materials from your doctor or pharmacy?: 1 - Never What is the last grade level you completed in school?: associate's degree  Interpreter Needed?: No  Information entered by :: Susa Engman, CMA   Activities of Daily Living     12/07/2023   10:36 AM  In your present state of health, do you have any  difficulty performing the following activities:  Hearing? 0  Vision? 0  Difficulty concentrating or making decisions? 0  Walking or climbing stairs? 0  Dressing or bathing? 0  Doing errands, shopping? 0  Preparing Food and eating ? N  Using the Toilet? N  In the past six months, have you accidently leaked urine? N  Do you have problems with loss of bowel control? N  Managing your Medications? N  Managing your Finances? N  Housekeeping or managing your Housekeeping? N    Patient Care Team: Ezell Hollow, MD as PCP - General Czinsky, Nancyann Aye, [place] as Physician Assistant (Orthopedic Surgery) Alejandra Amos, MD as Consulting Physician (Rehabilitation) Samson Croak, MD as Consulting Physician (Urology) [place], Thornell Flirt, MD as Referring Physician (Dermatology) Cecilie Coffee, RPH-CPP (Pharmacist) Mignon Alberts, DPM as Consulting Physician (Podiatry) Nathaniel Bald, M.D., PA  I have updated your Care Teams any recent Medical Services you may have received from other providers in the past year.     Assessment:   This is a routine wellness examination for Julias.  Hearing/Vision screen Hearing Screening - Comments:: Denies hearing difficulties.  Vision Screening - Comments:: Wears reading glasses -- up to date with routine eye exams (11/29/23).    Goals Addressed               This Visit's Progress     Continue eating healthy and remain active.  (pt-stated)   On track     DIET - INCREASE WATER INTAKE   On track      Depression Screen     12/07/2023   10:40 AM 10/30/2023   12:58 PM 07/04/2023    1:33 PM 02/27/2023    1:13 PM 12/06/2022   10:22 AM 10/26/2022    1:14 PM 07/27/2022   12:59 PM  PHQ 2/9 Scores  PHQ - 2 Score 0 0 1 1 1  0 0  PHQ- 9 Score 1          Fall Risk     12/07/2023   10:33 AM 10/30/2023   12:58 PM 07/04/2023  1:33 PM 02/27/2023    1:13 PM 12/06/2022   10:26 AM  Fall Risk   Falls in the past year? 0 0 0 1 0  Number falls in past yr: 0 0 0  0 0  Injury with Fall? 1 0 0 0 0  Risk for fall due to : Medication side effect    No Fall Risks  Follow up Falls evaluation completed Falls evaluation completed;Education provided Falls evaluation completed;Education provided Falls evaluation completed Falls evaluation completed    MEDICARE RISK AT HOME:  Medicare Risk at Home Any stairs in or around the home?: Yes If so, are there any without handrails?: No Home free of loose throw rugs in walkways, pet beds, electrical cords, etc?: Yes Adequate lighting in your home to reduce risk of falls?: Yes Life alert?: No Use of a cane, walker or w/c?: No Grab bars in the bathroom?: Yes Shower chair or bench in shower?: No Elevated toilet seat or a handicapped toilet?: No  TIMED UP AND GO:  Was the test performed?  No, audio  Cognitive Function: 6CIT completed    04/20/2015    8:42 AM  MMSE - Mini Mental State Exam  Orientation to time 5   Orientation to Place 5   Registration 3   Attention/ Calculation 5   Recall 3   Language- name 2 objects 2   Language- repeat 1  Language- follow 3 step command 3   Language- read & follow direction 1   Write a sentence 1   Copy design 1   Total score 30      Data saved with a previous flowsheet row definition        12/07/2023   10:44 AM 12/06/2022   10:26 AM 12/02/2021    9:50 AM  6CIT Screen  What Year? 0 points 0 points 0 points  What month? 0 points 0 points 0 points  What time? 0 points 0 points 0 points  Count back from 20 0 points 0 points 0 points  Months in reverse 0 points 0 points 0 points  Repeat phrase 0 points 0 points 2 points  Total Score 0 points 0 points 2 points    Immunizations Immunization History  Administered Date(s) Administered   Fluad Quad(high Dose 65+) 05/02/2019, 03/26/2020, 03/16/2022   Influenza Split 03/30/2011, 03/28/2012   Influenza Whole 03/02/2010   Influenza, High Dose Seasonal PF 03/17/2015, 04/19/2016, 03/16/2017, 03/26/2018, 03/24/2023    Influenza,inj,Quad PF,6+ Mos 03/20/2013, 03/25/2014   Influenza-Unspecified 03/15/2021   Moderna Covid-19 Fall Seasonal Vaccine 21yrs & older 03/24/2023   PFIZER(Purple Top)SARS-COV-2 Vaccination 08/02/2019, 08/24/2019, 03/20/2020, 10/26/2020   Pfizer Covid-19 Vaccine Bivalent Booster 64yrs & up 02/19/2021   Pneumococcal Conjugate-13 04/17/2015   Pneumococcal Polysaccharide-23 03/02/2010, 07/21/2020   Td 04/17/2015   Unspecified SARS-COV-2 Vaccination 03/22/2022    Screening Tests Health Maintenance  Topic Date Due   Medicare Annual Wellness (AWV)  12/06/2023   COVID-19 Vaccine (8 - Pfizer risk 2024-25 season) 12/27/2023 (Originally 09/22/2023)   INFLUENZA VACCINE  01/19/2024   HEMOGLOBIN A1C  05/01/2024   Diabetic kidney evaluation - Urine ACR  07/03/2024   FOOT EXAM  07/03/2024   Diabetic kidney evaluation - eGFR measurement  10/29/2024   OPHTHALMOLOGY EXAM  11/28/2024   DTaP/Tdap/Td (2 - Tdap) 04/16/2025   Pneumococcal Vaccine: 50+ Years  Completed   HPV VACCINES  Aged Out   Meningococcal B Vaccine  Aged Out   Hepatitis C Screening  Discontinued   Zoster Vaccines- Shingrix   Discontinued    Health Maintenance  Health Maintenance Due  Topic Date Due   Medicare Annual Wellness (AWV)  12/06/2023   Health Maintenance Items Addressed: All HM up to date  Additional Screening:  Vision Screening: Recommended annual ophthalmology exams for early detection of glaucoma and other disorders of the eye. Would you like a referral to an eye doctor? No    Dental Screening: Recommended annual dental exams for proper oral hygiene  Community Resource Referral / Chronic Care Management: CRR required this visit?  No   CCM required this visit?  No   Plan:    I have personally reviewed and noted the following in the patient's chart:   Medical and social history Use of alcohol, tobacco or illicit drugs  Current medications and supplements including opioid prescriptions. Patient is  currently taking opioid prescriptions. Information provided to patient regarding non-opioid alternatives. Patient advised to discuss non-opioid treatment plan with their provider. Functional ability and status Nutritional status Physical activity Advanced directives List of other physicians Hospitalizations, surgeries, and ER visits in previous 12 months Vitals Screenings to include cognitive, depression, and falls Referrals and appointments  In addition, I have reviewed and discussed with patient certain preventive protocols, quality metrics, and best practice recommendations. A written personalized care plan for preventive services as well as general preventive health recommendations were provided to patient.   Susa Engman, CMA   12/07/2023   After Visit Summary: (Mail) Due to this being a telephonic visit, the after visit summary with patients personalized plan was offered to patient via mail   Notes: Nothing significant to report at this time.

## 2024-01-03 DIAGNOSIS — D485 Neoplasm of uncertain behavior of skin: Secondary | ICD-10-CM | POA: Diagnosis not present

## 2024-01-05 DIAGNOSIS — M5416 Radiculopathy, lumbar region: Secondary | ICD-10-CM | POA: Diagnosis not present

## 2024-01-17 DIAGNOSIS — H61002 Unspecified perichondritis of left external ear: Secondary | ICD-10-CM | POA: Diagnosis not present

## 2024-01-17 DIAGNOSIS — B079 Viral wart, unspecified: Secondary | ICD-10-CM | POA: Diagnosis not present

## 2024-02-05 DIAGNOSIS — R351 Nocturia: Secondary | ICD-10-CM | POA: Diagnosis not present

## 2024-02-05 DIAGNOSIS — R3911 Hesitancy of micturition: Secondary | ICD-10-CM | POA: Diagnosis not present

## 2024-02-05 DIAGNOSIS — R3912 Poor urinary stream: Secondary | ICD-10-CM | POA: Diagnosis not present

## 2024-02-05 DIAGNOSIS — N401 Enlarged prostate with lower urinary tract symptoms: Secondary | ICD-10-CM | POA: Diagnosis not present

## 2024-02-29 ENCOUNTER — Other Ambulatory Visit: Payer: Self-pay | Admitting: Internal Medicine

## 2024-03-11 DIAGNOSIS — L570 Actinic keratosis: Secondary | ICD-10-CM | POA: Diagnosis not present

## 2024-04-08 ENCOUNTER — Telehealth: Payer: Self-pay | Admitting: Internal Medicine

## 2024-04-08 NOTE — Telephone Encounter (Signed)
 Requesting: Lyrica  200mg  Contract: Under contract w/ pain medicine UDS: Under contract w/ pain medicine Last Visit: 10/30/23 Next Visit: 06/24/24 Last Refill: 10/16/23 #60 and 6RF   Please Advise

## 2024-04-08 NOTE — Telephone Encounter (Signed)
 PDMP okay, Rx sent

## 2024-04-12 ENCOUNTER — Ambulatory Visit: Payer: Self-pay

## 2024-04-12 NOTE — Telephone Encounter (Signed)
 Tried calling [facility]- on hold for 5 mins, had to hang up. Dx: E11.40

## 2024-04-12 NOTE — Telephone Encounter (Signed)
 FYI Only or Action Required?: Action required by provider: Pharmacy needs Dx code for pregablin.  Patient was last seen in primary care on 10/30/2023 by Amon Aloysius BRAVO, MD.  Called Nurse Triage reporting Medication Problem.  Symptoms began now - pt at pharmacy.  Interventions attempted: Other: na.  Symptoms are: na.  Triage Disposition: Call PCP Now  Patient/caregiver understands and will follow disposition?: Yes                  Copied from CRM #[id number]. Topic: Clinical - Medication Question >> Apr 12, 2024  3:02 PM Suzen RAMAN wrote: Reason for CRM: patient at pharmacy and pregabalin  (LYRICA ) 200 MG capsule Is missing dx code. Reason for Disposition  [1] Prescription not at pharmacy AND [2] was prescribed by doctor (or NP/PA) recently  (Exception: [facility] has access to EMR and prescription is recorded there. Go to Home Care and confirm for pharmacy.)  Answer Assessment - Initial Assessment Questions 1. NAME of MEDICINE: What medicine(s) are you calling about?     Pregablin 200 2. QUESTION: What is your question? (e.g., double dose of medicine, side effect)     Pharmacy need dx code  3. PRESCRIBER: Who prescribed the medicine? Reason: if prescribed by specialist, call should be referred to that group.     Dr Amon  Protocols used: Medication Question Call-A-AH

## 2024-04-12 NOTE — Telephone Encounter (Signed)
 Sp[facility]- pharmacist went ahead and filled medication for Pt.

## 2024-04-17 DIAGNOSIS — L821 Other seborrheic keratosis: Secondary | ICD-10-CM | POA: Diagnosis not present

## 2024-04-17 DIAGNOSIS — L570 Actinic keratosis: Secondary | ICD-10-CM | POA: Diagnosis not present

## 2024-04-17 DIAGNOSIS — H61002 Unspecified perichondritis of left external ear: Secondary | ICD-10-CM | POA: Diagnosis not present

## 2024-04-24 DIAGNOSIS — H61002 Unspecified perichondritis of left external ear: Secondary | ICD-10-CM | POA: Diagnosis not present

## 2024-05-01 ENCOUNTER — Ambulatory Visit: Admitting: Internal Medicine

## 2024-05-26 ENCOUNTER — Other Ambulatory Visit: Payer: Self-pay | Admitting: Internal Medicine

## 2024-06-24 ENCOUNTER — Ambulatory Visit: Admitting: Internal Medicine

## 2024-06-24 ENCOUNTER — Encounter: Payer: Self-pay | Admitting: Internal Medicine

## 2024-06-24 VITALS — BP 116/64 | HR 75 | Temp 97.7°F | Resp 16 | Ht 70.0 in | Wt 227.1 lb

## 2024-06-24 DIAGNOSIS — E1140 Type 2 diabetes mellitus with diabetic neuropathy, unspecified: Secondary | ICD-10-CM

## 2024-06-24 DIAGNOSIS — E7800 Pure hypercholesterolemia, unspecified: Secondary | ICD-10-CM

## 2024-06-24 DIAGNOSIS — E038 Other specified hypothyroidism: Secondary | ICD-10-CM | POA: Diagnosis not present

## 2024-06-24 DIAGNOSIS — I10 Essential (primary) hypertension: Secondary | ICD-10-CM

## 2024-06-24 NOTE — Progress Notes (Signed)
 "  Subjective:    Patient ID: Bradley Navarro, male    DOB: 05-07-1941, 84 y.o.   MRN: [id number]  DOS:  06/24/2024 Follow-up  Discussed the use of AI scribe software for clinical note transcription with the patient, who gave verbal consent to proceed.  History of Present Illness Bradley Navarro is an 84 year old male who presents for a routine follow-up visit.  Peripheral neuropathy symptoms unchanged --Numbness and tingling in both feet  Hypertension management - Home blood pressure readings have been stable - Takes losartan   General well-being - Feels well with no new concerns since last visit six months ago - No fatigue - No constipation   Review of Systems See above   Past Medical History:  Diagnosis Date   Chronic back pain    Elevated BP without diagnosis of hypertension 04/24/2017   History of headache    frontal lobe cluster headaches   History of kidney stones    Post laminectomy syndrome    SCC (squamous cell carcinoma) 01/2020   Sciatica of right side 2016   S/P L5-S1 revision lam/PSF on 04-10-12 w/ relief of RLE pain post op, MRI 01/2014 shows minimal foraminal stenosis at L4-5 w/o recurrent stenosis at L5-S1    Past Surgical History:  Procedure Laterality Date   APPENDECTOMY     LUMBAR DISC SURGERY  2012   L-5, Dr Larene   LUMBAR FUSION  2015   rod, plates   r arm surger      Current Outpatient Medications  Medication Instructions   atorvastatin  (LIPITOR) 10 mg, Oral, Daily at bedtime   Calcium  Polycarbophil (FIBER-CAPS PO) 1 capsule, Daily   losartan  (COZAAR ) 100 mg, Oral, Daily   morphine  (MS CONTIN ) 30 mg, 2 times daily   Multiple Vitamin (MULITIVITAMIN WITH MINERALS) TABS 1 tablet, Daily   polyethylene glycol (MIRALAX / GLYCOLAX) 17 g, Every other day   pregabalin  (LYRICA ) 200 mg, Oral, 2 times daily   tamsulosin  (FLOMAX ) 0.4 mg, Daily   Vitamin D  (Cholecalciferol) 2,000 Units, Daily       Objective:   Physical Exam BP 116/64    Pulse 75   Temp 97.7 F (36.5 C) (Oral)   Resp 16   Ht 5' 10 (1.778 m)   Wt 227 lb 2 oz (103 kg)   SpO2 98%   BMI 32.59 kg/m  General:   Well developed, NAD, BMI noted. HEENT:  Normocephalic . Face symmetric, atraumatic Lungs:  CTA B Normal respiratory effort, no intercostal retractions, no accessory muscle use. Heart: RRR,  no murmur.  DM foot exam: No edema, toes well-perfused, pinprick examination diminished distally bilaterally Skin: Not pale. Not jaundice Neurologic:  alert & oriented X3.  Speech normal, gait appropriate for age and unassisted Psych--  Cognition and judgment appear intact.  Cooperative with normal attention span and concentration.  Behavior appropriate. No anxious or depressed appearing.      Assessment   Assessment DM (A1c increased to 6.9 on 07-2020) DM neuropathy HTN High cholesterol Chronic back pain-- sees pain management, see surgeries Chronic hoarseness: Never seen by ENT Sees dermatology SCC Abnormal EKG: TWI, RBBB, see note from 05-2018 BPH, LUTS: Saw urology, last visit 2020. h/o HA-- s/p extensive w/u in the past. Has seen Dr Gailen and neuro @ [facility]   h/o kidney stones , multiple Assessment & Plan DM with neuropathy Diet controlled, neuropathy stable, feet care reviewed. Check A1c, micro. HTN Well-controlled with losartan . Blood pressure readings are stable.  Checked BMP  Hyperlipidemia managed with atorvastatin . No new symptoms or complications. Check FLP. Subclinical hypothyroidism Asymptomatic, feels well,no current need for medication.  Check TFTs today and periodically General Health Maintenance Up to date with flu and COVID vaccinations.   Consider RSV vaccine at pharmacy. RTC CPX 5 months     "

## 2024-06-24 NOTE — Patient Instructions (Addendum)
 THE FOLLOWING IS A SUMMARY OF YOUR INSTRUCTION PLEASE REVIEW THEM BEFORE YOU LEAVE THE OFFICE AND LET US  KNOW IF YOU HAVE QUESTIONS    GO TO THE LAB :  Get the blood work    Go to the front desk for the checkout Please make an appointment for a physical exam in 5 months     Continue checking your blood pressure regularly Blood pressure goal:  between 110/65 and  130/80. If it is consistently higher or lower, let me know      DIABETIC NEUROPATHY: You have numbness and tingling in your feet, but no new symptoms or complications. -Avoid walking barefoot to prevent injury. -Check your feet at night for swelling, redness, or cuts.  HYPERTENSION: Your blood pressure is well-controlled with losartan , and your readings are stable. -Continue taking losartan  100 mg orally every day. -We checked your blood work to monitor potassium and kidney function.  HYPERLIPIDEMIA: Your cholesterol is being managed with atorvastatin , and there are no new symptoms or complications. -Continue taking atorvastatin  10 mg orally every night. -We checked your blood work to monitor cholesterol levels.  SUBCLINICAL HYPOTHYROIDISM: This is likely age-related and does not currently require medication. -We will recheck your thyroid  function periodically.  GENERAL HEALTH MAINTENANCE: You are up to date with your flu and COVID vaccinations. -Consider getting the RSV vaccine at the pharmacy as a preventive measure against pneumonia.                      Contains text generated by [employer]bridge.                                 Contains text generated by [employer]bridge.

## 2024-06-25 LAB — LIPID PANEL
Cholesterol: 123 mg/dL (ref 28–200)
HDL: 31.5 mg/dL — ABNORMAL LOW
LDL Cholesterol: 48 mg/dL (ref 10–99)
NonHDL: 91.03
Total CHOL/HDL Ratio: 4
Triglycerides: 217 mg/dL — ABNORMAL HIGH (ref 10.0–149.0)
VLDL: 43.4 mg/dL — ABNORMAL HIGH (ref 0.0–40.0)

## 2024-06-25 LAB — MICROALBUMIN / CREATININE URINE RATIO
Creatinine,U: 108.6 mg/dL
Microalb Creat Ratio: UNDETERMINED mg/g (ref 0.0–30.0)
Microalb, Ur: <0.7 mg/dL

## 2024-06-25 LAB — BASIC METABOLIC PANEL WITH GFR
BUN: 28 mg/dL — ABNORMAL HIGH (ref 6–23)
CO2: 28 meq/L (ref 19–32)
Calcium: 9.1 mg/dL (ref 8.4–10.5)
Chloride: 105 meq/L (ref 96–112)
Creatinine, Ser: 1.21 mg/dL (ref 0.40–1.50)
GFR: 55.83 mL/min — ABNORMAL LOW
Glucose, Bld: 134 mg/dL — ABNORMAL HIGH (ref 70–99)
Potassium: 4.6 meq/L (ref 3.5–5.1)
Sodium: 140 meq/L (ref 135–145)

## 2024-06-25 LAB — HEMOGLOBIN A1C: Hgb A1c MFr Bld: 6.3 % (ref 4.6–6.5)

## 2024-06-25 LAB — TSH: TSH: 5.75 u[IU]/mL — ABNORMAL HIGH (ref 0.35–5.50)

## 2024-06-25 NOTE — Assessment & Plan Note (Signed)
 DM with neuropathy Diet controlled, neuropathy stable, feet care reviewed. Check A1c, micro. HTN Well-controlled with losartan . Blood pressure readings are stable.  Checked BMP Hyperlipidemia managed with atorvastatin . No new symptoms or complications. Check FLP. Subclinical hypothyroidism Asymptomatic, feels well,no current need for medication.  Check TFTs today and periodically General Health Maintenance Up to date with flu and COVID vaccinations.   Consider RSV vaccine at pharmacy. RTC CPX 5 months

## 2024-06-26 ENCOUNTER — Ambulatory Visit: Payer: Self-pay | Admitting: Internal Medicine

## 2024-11-22 ENCOUNTER — Encounter: Admitting: Internal Medicine

## 2024-12-10 ENCOUNTER — Ambulatory Visit
